# Patient Record
Sex: Male | Born: 1970 | Race: White | Hispanic: No | Marital: Married | Smoking: Former smoker
Health system: Southern US, Community
[De-identification: ages and names within clinical notes are randomized; demographics above are authoritative.]

## PROBLEM LIST (undated history)

## (undated) DIAGNOSIS — R0683 Snoring: Secondary | ICD-10-CM

## (undated) DIAGNOSIS — Z973 Presence of spectacles and contact lenses: Secondary | ICD-10-CM

## (undated) DIAGNOSIS — J329 Chronic sinusitis, unspecified: Secondary | ICD-10-CM

## (undated) DIAGNOSIS — J302 Other seasonal allergic rhinitis: Secondary | ICD-10-CM

## (undated) DIAGNOSIS — I10 Essential (primary) hypertension: Secondary | ICD-10-CM

## (undated) DIAGNOSIS — K219 Gastro-esophageal reflux disease without esophagitis: Secondary | ICD-10-CM

## (undated) HISTORY — PX: FRACTURE SURGERY: SHX138

## (undated) HISTORY — PX: WISDOM TOOTH EXTRACTION: SHX21

## (undated) HISTORY — PX: JOINT REPLACEMENT: SHX530

---
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
## 2013-04-10 ENCOUNTER — Encounter (HOSPITAL_BASED_OUTPATIENT_CLINIC_OR_DEPARTMENT_OTHER): Payer: Self-pay | Admitting: *Deleted

## 2013-04-10 NOTE — Progress Notes (Signed)
Pt probably has sleep apnea-talked to dr shoemaker about it-told py he may need to stay overnight -to bring overnight bag-does not smoke-no cardiac or other resp problems Had ekg and labs last month pcp-called for copies

## 2013-04-10 NOTE — Progress Notes (Signed)
04/10/13 1657  OBSTRUCTIVE SLEEP APNEA  Have you ever been diagnosed with sleep apnea through a sleep study? No  Do you snore loudly (loud enough to be heard through closed doors)?  1  Do you often feel tired, fatigued, or sleepy during the daytime? 0  Has anyone observed you stop breathing during your sleep? 1  Do you have, or are you being treated for high blood pressure? 1  BMI more than 35 kg/m2? 0  Age over 44 years old? 0  Neck circumference greater than 40 cm/18 inches? 0  Gender: 1  Obstructive Sleep Apnea Score 4  Score 4 or greater  Results sent to PCP

## 2013-04-14 ENCOUNTER — Ambulatory Visit (HOSPITAL_BASED_OUTPATIENT_CLINIC_OR_DEPARTMENT_OTHER): Payer: BC Managed Care – PPO | Admitting: Certified Registered Nurse Anesthetist

## 2013-04-14 ENCOUNTER — Ambulatory Visit (HOSPITAL_BASED_OUTPATIENT_CLINIC_OR_DEPARTMENT_OTHER)
Admission: RE | Admit: 2013-04-14 | Discharge: 2013-04-14 | Disposition: A | Discharge status: Home / Self Care | Payer: BC Managed Care – PPO | Source: Ambulatory Visit | Attending: Otolaryngology | Admitting: Otolaryngology

## 2013-04-14 ENCOUNTER — Encounter (HOSPITAL_BASED_OUTPATIENT_CLINIC_OR_DEPARTMENT_OTHER): Payer: Self-pay | Admitting: Certified Registered Nurse Anesthetist

## 2013-04-14 ENCOUNTER — Encounter (HOSPITAL_BASED_OUTPATIENT_CLINIC_OR_DEPARTMENT_OTHER)
Admission: RE | Disposition: A | Discharge status: Home / Self Care | Payer: Self-pay | Source: Ambulatory Visit | Attending: Otolaryngology

## 2013-04-14 ENCOUNTER — Encounter (HOSPITAL_BASED_OUTPATIENT_CLINIC_OR_DEPARTMENT_OTHER): Payer: BC Managed Care – PPO | Admitting: Certified Registered Nurse Anesthetist

## 2013-04-14 DIAGNOSIS — I10 Essential (primary) hypertension: Secondary | ICD-10-CM | POA: Insufficient documentation

## 2013-04-14 DIAGNOSIS — J342 Deviated nasal septum: Secondary | ICD-10-CM

## 2013-04-14 DIAGNOSIS — J329 Chronic sinusitis, unspecified: Secondary | ICD-10-CM | POA: Insufficient documentation

## 2013-04-14 HISTORY — DX: Snoring: R06.83

## 2013-04-14 HISTORY — DX: Other seasonal allergic rhinitis: J30.2

## 2013-04-14 HISTORY — DX: Chronic sinusitis, unspecified: J32.9

## 2013-04-14 HISTORY — DX: Presence of spectacles and contact lenses: Z97.3

## 2013-04-14 HISTORY — DX: Essential (primary) hypertension: I10

## 2013-04-14 HISTORY — PX: NASAL SEPTOPLASTY W/ TURBINOPLASTY: SHX2070

## 2013-04-14 SURGERY — SEPTOPLASTY, NOSE, WITH NASAL TURBINATE REDUCTION
Anesthesia: General | Site: Nose | Laterality: Bilateral | Wound class: Clean Contaminated

## 2013-04-14 MED ORDER — PROMETHAZINE HCL 25 MG/ML IJ SOLN
6.2500 mg | INTRAMUSCULAR | Status: DC | PRN
  Administered 2013-04-14: 12.5 mg via INTRAVENOUS

## 2013-04-14 MED ORDER — FENTANYL CITRATE 0.05 MG/ML IJ SOLN
INTRAMUSCULAR | Status: AC
  Filled 2013-04-14: qty 6

## 2013-04-14 MED ORDER — FENTANYL CITRATE 0.05 MG/ML IJ SOLN
INTRAMUSCULAR | Status: DC | PRN
  Administered 2013-04-14: 100 ug via INTRAVENOUS

## 2013-04-14 MED ORDER — HYDROMORPHONE HCL PF 1 MG/ML IJ SOLN
INTRAMUSCULAR | Status: AC
  Filled 2013-04-14: qty 1

## 2013-04-14 MED ORDER — LIDOCAINE-EPINEPHRINE 1 %-1:100000 IJ SOLN
INTRAMUSCULAR | Status: DC | PRN
  Administered 2013-04-14: 8 mL

## 2013-04-14 MED ORDER — OXYMETAZOLINE HCL 0.05 % NA SOLN
NASAL | Status: AC
  Filled 2013-04-14: qty 15

## 2013-04-14 MED ORDER — MUPIROCIN 2 % EX OINT
TOPICAL_OINTMENT | CUTANEOUS | Status: DC | PRN
  Administered 2013-04-14: 1 via NASAL

## 2013-04-14 MED ORDER — HYDROMORPHONE HCL PF 1 MG/ML IJ SOLN
0.2500 mg | INTRAMUSCULAR | Status: DC | PRN
  Administered 2013-04-14: 0.5 mg via INTRAVENOUS

## 2013-04-14 MED ORDER — ONDANSETRON HCL 4 MG/2ML IJ SOLN
INTRAMUSCULAR | Status: DC | PRN
  Administered 2013-04-14: 4 mg via INTRAVENOUS

## 2013-04-14 MED ORDER — LIDOCAINE HCL (CARDIAC) 20 MG/ML IV SOLN
INTRAVENOUS | Status: DC | PRN
  Administered 2013-04-14: 80 mg via INTRAVENOUS

## 2013-04-14 MED ORDER — PROMETHAZINE HCL 25 MG/ML IJ SOLN
INTRAMUSCULAR | Status: AC
  Filled 2013-04-14: qty 1

## 2013-04-14 MED ORDER — PROPOFOL 10 MG/ML IV BOLUS
INTRAVENOUS | Status: DC | PRN
  Administered 2013-04-14: 300 mg via INTRAVENOUS

## 2013-04-14 MED ORDER — MIDAZOLAM HCL 5 MG/5ML IJ SOLN
INTRAMUSCULAR | Status: DC | PRN
  Administered 2013-04-14: 2 mg via INTRAVENOUS

## 2013-04-14 MED ORDER — HYDROCODONE-ACETAMINOPHEN 5-325 MG PO TABS: 1.0000 | ORAL_TABLET | Freq: Four times a day (QID) | ORAL | Status: DC | PRN

## 2013-04-14 MED ORDER — OXYCODONE HCL 5 MG PO TABS: 5.0000 mg | ORAL_TABLET | Freq: Once | ORAL | Status: DC | PRN

## 2013-04-14 MED ORDER — MUPIROCIN 2 % EX OINT
TOPICAL_OINTMENT | CUTANEOUS | Status: AC
  Filled 2013-04-14: qty 22

## 2013-04-14 MED ORDER — MIDAZOLAM HCL 2 MG/2ML IJ SOLN
INTRAMUSCULAR | Status: AC
  Filled 2013-04-14: qty 2

## 2013-04-14 MED ORDER — OXYMETAZOLINE HCL 0.05 % NA SOLN
NASAL | Status: DC | PRN
  Administered 2013-04-14: 1 via NASAL

## 2013-04-14 MED ORDER — PROPOFOL 10 MG/ML IV EMUL
INTRAVENOUS | Status: AC
  Filled 2013-04-14: qty 50

## 2013-04-14 MED ORDER — BACITRACIN ZINC 500 UNIT/GM EX OINT
TOPICAL_OINTMENT | CUTANEOUS | Status: AC
  Filled 2013-04-14: qty 28.35

## 2013-04-14 MED ORDER — LIDOCAINE-EPINEPHRINE 1 %-1:100000 IJ SOLN
INTRAMUSCULAR | Status: AC
  Filled 2013-04-14: qty 1

## 2013-04-14 MED ORDER — DEXAMETHASONE SODIUM PHOSPHATE 4 MG/ML IJ SOLN
INTRAMUSCULAR | Status: DC | PRN
  Administered 2013-04-14: 10 mg via INTRAVENOUS

## 2013-04-14 MED ORDER — AMOXICILLIN-POT CLAVULANATE 500-125 MG PO TABS: 1.0000 | ORAL_TABLET | Freq: Two times a day (BID) | ORAL | Status: DC

## 2013-04-14 MED ORDER — CEFAZOLIN SODIUM-DEXTROSE 2-3 GM-% IV SOLR
2.0000 g | Freq: Once | INTRAVENOUS | Status: AC
  Administered 2013-04-14: 2 g via INTRAVENOUS

## 2013-04-14 MED ORDER — SUCCINYLCHOLINE CHLORIDE 20 MG/ML IJ SOLN
INTRAMUSCULAR | Status: DC | PRN
  Administered 2013-04-14: 100 mg via INTRAVENOUS

## 2013-04-14 MED ORDER — OXYCODONE HCL 5 MG/5ML PO SOLN: 5.0000 mg | Freq: Once | ORAL | Status: DC | PRN

## 2013-04-14 MED ORDER — CEFAZOLIN SODIUM 1-5 GM-% IV SOLN
INTRAVENOUS | Status: AC
  Filled 2013-04-14: qty 100

## 2013-04-14 MED ORDER — LACTATED RINGERS IV SOLN
INTRAVENOUS | Status: DC
  Administered 2013-04-14 (×3): via INTRAVENOUS

## 2013-04-14 SURGICAL SUPPLY — 31 items
ATTRACTOMAT 16X20 MAGNETIC DRP (DRAPES) IMPLANT
BLADE SURG 15 STRL LF DISP TIS (BLADE) ×1 IMPLANT
BLADE SURG 15 STRL SS (BLADE) ×1
CANISTER SUCT 1200ML W/VALVE (MISCELLANEOUS) ×2 IMPLANT
COAGULATOR SUCT 8FR VV (MISCELLANEOUS) IMPLANT
DECANTER SPIKE VIAL GLASS SM (MISCELLANEOUS) IMPLANT
DRSG NASOPORE 8CM (GAUZE/BANDAGES/DRESSINGS) IMPLANT
DRSG TELFA 3X8 NADH (GAUZE/BANDAGES/DRESSINGS) ×2 IMPLANT
ELECT REM PT RETURN 9FT ADLT (ELECTROSURGICAL) ×2
ELECTRODE REM PT RTRN 9FT ADLT (ELECTROSURGICAL) ×1 IMPLANT
GLOVE BIO SURGEON STRL SZ 6.5 (GLOVE) ×2 IMPLANT
GLOVE BIOGEL M 7.0 STRL (GLOVE) ×4 IMPLANT
GLOVE BIOGEL PI IND STRL 7.0 (GLOVE) ×1 IMPLANT
GLOVE BIOGEL PI INDICATOR 7.0 (GLOVE) ×1
GOWN PREVENTION PLUS XLARGE (GOWN DISPOSABLE) ×4 IMPLANT
NEEDLE 27GAX1X1/2 (NEEDLE) ×2 IMPLANT
NS IRRIG 1000ML POUR BTL (IV SOLUTION) IMPLANT
PACK BASIN DAY SURGERY FS (CUSTOM PROCEDURE TRAY) ×2 IMPLANT
PACK ENT DAY SURGERY (CUSTOM PROCEDURE TRAY) ×2 IMPLANT
SET EXT MALE ROTATING LL 32IN (MISCELLANEOUS) ×2 IMPLANT
SLEEVE SCD COMPRESS KNEE MED (MISCELLANEOUS) ×2 IMPLANT
SPLINT NASAL DOYLE BI-VL (GAUZE/BANDAGES/DRESSINGS) ×2 IMPLANT
SPONGE GAUZE 2X2 8PLY STRL LF (GAUZE/BANDAGES/DRESSINGS) ×2 IMPLANT
SPONGE NEURO XRAY DETECT 1X3 (DISPOSABLE) ×2 IMPLANT
SPONGE SURGIFOAM ABS GEL 12-7 (HEMOSTASIS) IMPLANT
SUT ETHILON 3 0 PS 1 (SUTURE) ×2 IMPLANT
SUT PLAIN 4 0 ~~LOC~~ 1 (SUTURE) ×2 IMPLANT
TOWEL OR 17X24 6PK STRL BLUE (TOWEL DISPOSABLE) ×2 IMPLANT
TUBE SALEM SUMP 12R W/ARV (TUBING) IMPLANT
TUBE SALEM SUMP 16 FR W/ARV (TUBING) ×2 IMPLANT
YANKAUER SUCT BULB TIP NO VENT (SUCTIONS) ×2 IMPLANT

## 2013-04-14 NOTE — Anesthesia Preprocedure Evaluation (Addendum)
Anesthesia Evaluation  Patient identified by MRN, date of birth, ID band Patient awake    Reviewed: Allergy & Precautions, NPO status   Airway Mallampati: I  Neck ROM: Full    Dental  (+) Teeth Intact   Pulmonary  breath sounds clear to auscultation        Cardiovascular hypertension, Pt. on medications Rhythm:Regular Rate:Normal     Neuro/Psych    GI/Hepatic   Endo/Other    Renal/GU      Musculoskeletal   Abdominal   Peds  Hematology   Anesthesia Other Findings   Reproductive/Obstetrics                         Anesthesia Physical Anesthesia Plan  ASA: II  Anesthesia Plan: General   Post-op Pain Management:    Induction: Intravenous  Airway Management Planned: Oral ETT  Additional Equipment:   Intra-op Plan:   Post-operative Plan: Extubation in OR  Informed Consent: I have reviewed the patients History and Physical, chart, labs and discussed the procedure including the risks, benefits and alternatives for the proposed anesthesia with the patient or authorized representative who has indicated his/her understanding and acceptance.   Dental advisory given  Plan Discussed with:   Anesthesia Plan Comments:         Anesthesia Quick Evaluation

## 2013-04-14 NOTE — H&P (Signed)
Douglas Whitney is an 42 y.o. male.   Chief Complaint: Nasal Obstruction HPI: Prog hx of nasal obstruction and snoring  Past Medical History  Diagnosis Date  . Hypertension   . Sinusitis   . Seasonal allergies   . Snores   . Wears glasses     Past Surgical History  Procedure Laterality Date  . Wisdom tooth extraction    . Finger amputation  1983    cut off tip lt index finger  . Orif finger fracture  1984    lt middle    History reviewed. No pertinent family history. Social History:  reports that he quit smoking about 20 years ago. He does not have any smokeless tobacco history on file. He reports that he drinks alcohol. He reports that he does not use illicit drugs.  Allergies: No Known Allergies  Medications Prior to Admission  Medication Sig Dispense Refill  . fish oil-omega-3 fatty acids 1000 MG capsule Take 2 g by mouth daily.      . hydrochlorothiazide (HYDRODIURIL) 25 MG tablet Take 25 mg by mouth daily.      . mometasone (NASONEX) 50 MCG/ACT nasal spray Place 2 sprays into the nose daily.        No results found for this or any previous visit (from the past 48 hour(s)). No results found.  Review of Systems  Constitutional: Negative.   Respiratory: Negative.   Cardiovascular: Negative.   Gastrointestinal: Negative.   Neurological: Negative.     Blood pressure 143/94, pulse 66, temperature 98.6 F (37 C), temperature source Oral, resp. rate 20, height 6\' 4"  (1.93 m), weight 130.182 kg (287 lb), SpO2 97.00%. Physical Exam  Constitutional: He is oriented to person, place, and time. He appears well-developed and well-nourished.  HENT:  Nose: Septal deviation present.  Neck: Normal range of motion. Neck supple.  Cardiovascular: Normal rate.   Respiratory: Effort normal.  GI: Soft.  Musculoskeletal: Normal range of motion.  Neurological: He is alert and oriented to person, place, and time.     Assessment/Plan Adm for OP septo and IT  reduction.  Douglas Whitney 04/14/2013, 9:07 AM

## 2013-04-14 NOTE — Anesthesia Postprocedure Evaluation (Signed)
  Anesthesia Post-op Note  Patient: Douglas Whitney  Procedure(s) Performed: Procedure(s): NASAL SEPTOPLASTY BILATERAL INFERIOR TURBINATE REDUCTION  (Bilateral)  Patient Location: PACU  Anesthesia Type:General  Level of Consciousness: awake  Airway and Oxygen Therapy: Patient Spontanous Breathing  Post-op Pain: mild  Post-op Assessment: Post-op Vital signs reviewed  Post-op Vital Signs: stable  Complications: No apparent anesthesia complications

## 2013-04-14 NOTE — Brief Op Note (Signed)
04/14/2013  10:15 AM  PATIENT:  Douglas Whitney  42 y.o. male  PRE-OPERATIVE DIAGNOSIS:  DEVIATED SEPTUM   POST-OPERATIVE DIAGNOSIS:  DEVIATED SEPTUM   PROCEDURE:  Procedure(s): NASAL SEPTOPLASTY BILATERAL INFERIOR TURBINATE REDUCTION  (Bilateral)  SURGEON:  Surgeon(s) and Role:    * Osborn Coho, MD - Primary  PHYSICIAN ASSISTANT:   ASSISTANTS: none   ANESTHESIA:   general  EBL:  Total I/O In: 1500 [I.V.:1500] Out: - < 50 cc  BLOOD ADMINISTERED:none  DRAINS: none   LOCAL MEDICATIONS USED:  LIDOCAINE  and Amount: 8 ml  SPECIMEN:  No Specimen  DISPOSITION OF SPECIMEN:  N/A  COUNTS:  YES  TOURNIQUET:  * No tourniquets in log *  DICTATION: .Other Dictation: Dictation Number [id number]  PLAN OF CARE: Discharge to home after PACU  PATIENT DISPOSITION:  PACU - hemodynamically stable.   Delay start of Pharmacological VTE agent (>24hrs) due to surgical blood loss or risk of bleeding: not applicable

## 2013-04-14 NOTE — Transfer of Care (Signed)
Immediate Anesthesia Transfer of Care Note  Patient: Douglas Whitney  Procedure(s) Performed: Procedure(s): NASAL SEPTOPLASTY BILATERAL INFERIOR TURBINATE REDUCTION  (Bilateral)  Patient Location: PACU  Anesthesia Type:General  Level of Consciousness: awake, alert , oriented and patient cooperative  Airway & Oxygen Therapy: Patient Spontanous Breathing and Patient connected to face mask oxygen  Post-op Assessment: Report given to PACU RN and Post -op Vital signs reviewed and stable  Post vital signs: Reviewed and stable  Complications: No apparent anesthesia complications

## 2013-04-14 NOTE — Anesthesia Procedure Notes (Signed)
Procedure Name: Intubation Date/Time: 04/14/2013 9:13 AM Performed by: Annalee Genta, DAVID Pre-anesthesia Checklist: Patient identified, Emergency Drugs available, Suction available and Patient being monitored Patient Re-evaluated:Patient Re-evaluated prior to inductionOxygen Delivery Method: Circle System Utilized Preoxygenation: Pre-oxygenation with 100% oxygen Intubation Type: IV induction Ventilation: Mask ventilation without difficulty Laryngoscope Size: Mac and 3 Grade View: Grade II Tube type: Oral Tube size: 7.0 mm Number of attempts: 1 Airway Equipment and Method: stylet and oral airway Placement Confirmation: ETT inserted through vocal cords under direct vision,  positive ETCO2 and breath sounds checked- equal and bilateral Secured at: 22 cm Tube secured with: Tape Dental Injury: Teeth and Oropharynx as per pre-operative assessment

## 2013-04-17 ENCOUNTER — Encounter (HOSPITAL_BASED_OUTPATIENT_CLINIC_OR_DEPARTMENT_OTHER): Payer: Self-pay | Admitting: Otolaryngology

## 2013-04-17 LAB — POCT I-STAT, CHEM 8
BUN: 18 mg/dL (ref 6–23)
Calcium, Ion: 1.2 mmol/L (ref 1.12–1.23)
Chloride: 105 mEq/L (ref 96–112)
Creatinine, Ser: 1 mg/dL (ref 0.50–1.35)
Glucose, Bld: 103 mg/dL — ABNORMAL HIGH (ref 70–99)
Sodium: 142 mEq/L (ref 135–145)

## 2013-04-17 NOTE — Op Note (Signed)
NAME:  Douglas Whitney, REPPOND NO.:  [id number]  MEDICAL RECORD NO.:  [id number]  LOCATION:                               FACILITY:  [facility]  PHYSICIAN:  Kinnie Scales. Annalee Genta, M.D.DATE OF BIRTH:  1971/05/03  DATE OF PROCEDURE:  04/14/2013 DATE OF DISCHARGE:  04/14/2013                              OPERATIVE REPORT   PREOPERATIVE DIAGNOSES: 1. Progressive nasal airway obstruction. 2. Deviated nasal septum. 3. Inferior turbinate hypertrophy.  POSTOPERATIVE DIAGNOSES: 1. Progressive nasal airway obstruction. 2. Deviated nasal septum. 3. Inferior turbinate hypertrophy.  INDICATION FOR SURGERY: 1. Progressive nasal airway obstruction. 2. Deviated nasal septum. 3. Inferior turbinate hypertrophy.  SURGICAL PROCEDURE: 1. Nasal septoplasty. 2. Bilateral inferior turbinate reduction.  ANESTHESIA:  General endotracheal.  COMPLICATIONS:  No complications.  ESTIMATED BLOOD LOSS:  Less than 50 mL.  The patient transferred from the operating room to the recovery room in stable condition.  BRIEF HISTORY:  The patient is a 42 year old white male, referred to our office for evaluation of progressive symptoms of nasal airway obstruction, heavy nighttime congestion, and nighttime snoring. Examination showed a severely deviated nasal septum with nasal airway obstruction and bilateral inferior turbinate hypertrophy.  The patient was treated with topical nasal steroid, saline spray, and antihistamines and failed to have an adequate response with continued symptoms of nasal airway obstruction and congestion.  Given his history and physical examination, I recommended nasal septoplasty and bilateral inferior turbinate reduction.  The risks and benefits of the procedure were discussed in detail with the patient and his wife and they understood and concurred with our plan for surgery which is scheduled as an outpatient under general anesthesia on April 14, 2013.  DESCRIPTION OF  PROCEDURE:  The patient was brought to the operating room and placed in supine position on the operating table.  General endotracheal anesthesia was established without difficulty.  When the patient was adequately anesthetized, he was positioned on the operating table and prepped and draped in a sterile fashion.  His nose was injected with a total of 8 mL of 1% lidocaine with 1:100,000 solution epinephrine injected in submucosal fashion along the nasal septum and inferior turbinates bilaterally.  The patient's nose was then packed with Afrin-soaked cottonoid pledgets and left in place for approximately 10 minutes to allow for vasoconstriction and hemostasis.  When the patient was prepped and draped for surgery, a left anterior hemitransfixion incision was created and a mucoperichondrial flap was elevated from anterior to posterior along the left-hand side.  The anterior cartilaginous septum was across the midline and mucoperichondrial flap elevated on the right.  Deviated bone and cartilage in the anterior and mid aspects of the septum were removed. This tissue was later morselized and returned to the mucoperichondrial pocket.  Dissection was then carried out from anterior to posterior resecting deviated bone and cartilage, preserving the overlying mucosa and bringing the septum to a good midline position.  The cartilage was morselized and returned to the mucoperichondrial pocket.  The flaps were reapproximated with a 4-0 gut suture on a Keith needle in a horizontal mattress fashion.  The anterior hemitransfixion incision was closed with the same stitch.  At the conclusion of procedure, bilateral  Doyle nasal septal splints were placed after the application of Bactroban ointment and sutured in position with a 3-0 Ethilon suture.  Inferior turbinate reduction was then performed with cautery set at 12 watts, 2 submucosal passes were made in each inferior turbinate.  When the turbinates had  been adequately cauterized, anterior incisions were created bilaterally, overlying soft tissue was elevated and small amount of turbinate bone was resected.  Turbinates were then outfractured to create a more patent nasal cavity.  The patient's nasal cavity was irrigated and suctioned and orogastric tube was passed.  Stomach contents were aspirated.  The patient was then awakened from his anesthetic, he was extubated and transferred from the operating room to the recovery room in stable condition.  There were no complications.  Estimated blood loss was less than 50 mL.          ______________________________ Kinnie Scales. Annalee Genta, M.D.     DLS/MEDQ  D:  40/98/1191  T:  04/15/2013  Job:  [id number]

## 2013-05-14 ENCOUNTER — Encounter (HOSPITAL_COMMUNITY): Payer: Self-pay | Admitting: Emergency Medicine

## 2013-05-14 ENCOUNTER — Emergency Department (HOSPITAL_COMMUNITY): Payer: BC Managed Care – PPO

## 2013-05-14 ENCOUNTER — Emergency Department (HOSPITAL_COMMUNITY)
Admission: EM | Admit: 2013-05-14 | Discharge: 2013-05-14 | Disposition: A | Discharge status: Home / Self Care | Payer: BC Managed Care – PPO | Attending: Emergency Medicine | Admitting: Emergency Medicine

## 2013-05-14 DIAGNOSIS — Z87891 Personal history of nicotine dependence: Secondary | ICD-10-CM | POA: Insufficient documentation

## 2013-05-14 DIAGNOSIS — I10 Essential (primary) hypertension: Secondary | ICD-10-CM | POA: Insufficient documentation

## 2013-05-14 DIAGNOSIS — R079 Chest pain, unspecified: Secondary | ICD-10-CM

## 2013-05-14 DIAGNOSIS — K219 Gastro-esophageal reflux disease without esophagitis: Secondary | ICD-10-CM

## 2013-05-14 DIAGNOSIS — R109 Unspecified abdominal pain: Secondary | ICD-10-CM

## 2013-05-14 DIAGNOSIS — R110 Nausea: Secondary | ICD-10-CM | POA: Insufficient documentation

## 2013-05-14 DIAGNOSIS — R1011 Right upper quadrant pain: Secondary | ICD-10-CM | POA: Insufficient documentation

## 2013-05-14 DIAGNOSIS — Z8709 Personal history of other diseases of the respiratory system: Secondary | ICD-10-CM | POA: Insufficient documentation

## 2013-05-14 DIAGNOSIS — Z79899 Other long term (current) drug therapy: Secondary | ICD-10-CM | POA: Insufficient documentation

## 2013-05-14 LAB — CBC
HCT: 40 % (ref 39.0–52.0)
Hemoglobin: 14.7 g/dL (ref 13.0–17.0)
MCH: 32 pg (ref 26.0–34.0)
MCHC: 36.8 g/dL — ABNORMAL HIGH (ref 30.0–36.0)
MCV: 87 fL (ref 78.0–100.0)
RBC: 4.6 MIL/uL (ref 4.22–5.81)
WBC: 8.1 10*3/uL (ref 4.0–10.5)

## 2013-05-14 LAB — POCT I-STAT, CHEM 8
BUN: 15 mg/dL (ref 6–23)
Calcium, Ion: 1.19 mmol/L (ref 1.12–1.23)
Chloride: 101 mEq/L (ref 96–112)
Creatinine, Ser: 1 mg/dL (ref 0.50–1.35)
Glucose, Bld: 129 mg/dL — ABNORMAL HIGH (ref 70–99)
Hemoglobin: 14.3 g/dL (ref 13.0–17.0)
Sodium: 141 mEq/L (ref 135–145)

## 2013-05-14 LAB — POCT I-STAT TROPONIN I
Troponin i, poc: 0 ng/mL (ref 0.00–0.08)
Troponin i, poc: 0.01 ng/mL (ref 0.00–0.08)

## 2013-05-14 LAB — COMPREHENSIVE METABOLIC PANEL
Albumin: 3.8 g/dL (ref 3.5–5.2)
Alkaline Phosphatase: 65 U/L (ref 39–117)
BUN: 13 mg/dL (ref 6–23)
CO2: 26 mEq/L (ref 19–32)
Calcium: 9.2 mg/dL (ref 8.4–10.5)
Chloride: 99 mEq/L (ref 96–112)
Creatinine, Ser: 0.8 mg/dL (ref 0.50–1.35)
GFR calc non Af Amer: >90 mL/min (ref >90)
Glucose, Bld: 123 mg/dL — ABNORMAL HIGH (ref 70–99)
Potassium: 3.3 mEq/L — ABNORMAL LOW (ref 3.5–5.1)
Total Bilirubin: 0.4 mg/dL (ref 0.3–1.2)

## 2013-05-14 LAB — LIPASE, BLOOD: Lipase: 43 U/L (ref 11–59)

## 2013-05-14 MED ORDER — NITROGLYCERIN 0.4 MG SL SUBL
0.4000 mg | SUBLINGUAL_TABLET | SUBLINGUAL | Status: DC | PRN
  Administered 2013-05-14: 0.4 mg via SUBLINGUAL

## 2013-05-14 MED ORDER — ONDANSETRON HCL 4 MG/2ML IJ SOLN
4.0000 mg | Freq: Once | INTRAMUSCULAR | Status: AC
  Administered 2013-05-14: 4 mg via INTRAVENOUS
  Filled 2013-05-14: qty 2

## 2013-05-14 MED ORDER — ASPIRIN 81 MG PO CHEW
324.0000 mg | CHEWABLE_TABLET | Freq: Once | ORAL | Status: AC
Start: 2013-05-14 — End: 2013-05-14
  Administered 2013-05-14: 324 mg via ORAL
  Filled 2013-05-14: qty 4

## 2013-05-14 MED ORDER — FENTANYL CITRATE 0.05 MG/ML IJ SOLN: 50.0000 ug | INTRAMUSCULAR | Status: DC | PRN

## 2013-05-14 MED ORDER — POTASSIUM CHLORIDE CRYS ER 20 MEQ PO TBCR
40.0000 meq | EXTENDED_RELEASE_TABLET | Freq: Once | ORAL | Status: AC
  Administered 2013-05-14: 40 meq via ORAL
  Filled 2013-05-14: qty 2

## 2013-05-14 MED ORDER — RANITIDINE HCL 150 MG PO TABS: 150.0000 mg | ORAL_TABLET | Freq: Two times a day (BID) | ORAL | Status: DC

## 2013-05-14 MED ORDER — PANTOPRAZOLE SODIUM 40 MG IV SOLR
40.0000 mg | Freq: Once | INTRAVENOUS | Status: AC
  Administered 2013-05-14: 40 mg via INTRAVENOUS
  Filled 2013-05-14: qty 40

## 2013-05-14 NOTE — ED Provider Notes (Signed)
CSN: [id number]     Arrival date & time 05/14/13  0320 History   First MD Initiated Contact with Patient 05/14/13 [phone]     Chief Complaint  Patient presents with  . Chest Pain   (Consider location/radiation/quality/duration/timing/severity/associated sxs/prior Treatment) HPI History provided by patient. Woke up this morning with right upper quadrant abdominal pain. Sharp in quality and moderate severity. No radiation of pain. It has been persistent without known alleviating factors. No difficulty breathing. It does hurt to take a deep breath. Has had some belching with a history of GERD but denies any burning pain or heartburn otherwise. No chest pain. Some nausea but no vomiting. Ate Seafood at a dinner party tonight, no history of gallbladder problems. No known history of heart disease. Past Medical History  Diagnosis Date  . Hypertension   . Sinusitis   . Seasonal allergies   . Snores   . Wears glasses    Past Surgical History  Procedure Laterality Date  . Wisdom tooth extraction    . Finger amputation  1983    cut off tip lt index finger  . Orif finger fracture  1984    lt middle  . Nasal septoplasty w/ turbinoplasty Bilateral 04/14/2013    Procedure: NASAL SEPTOPLASTY BILATERAL INFERIOR TURBINATE REDUCTION ;  Surgeon: Osborn Coho, MD;  Location: [facility];  Service: ENT;  Laterality: Bilateral;   No family history on file. History  Substance Use Topics  . Smoking status: Former Smoker    Quit date: 04/10/1993  . Smokeless tobacco: Not on file  . Alcohol Use: Yes     Comment: rare    Review of Systems  Constitutional: Negative for fever and chills.  Respiratory: Negative for shortness of breath.   Cardiovascular: Negative for palpitations.  Gastrointestinal: Positive for nausea and abdominal pain.  Genitourinary: Negative for dysuria.  Musculoskeletal: Negative for back pain, neck pain and neck stiffness.  Skin: Negative for rash.  Neurological:  Negative for headaches.  All other systems reviewed and are negative.    Allergies  Review of patient's allergies indicates no known allergies.  Home Medications   Current Outpatient Rx  Name  Route  Sig  Dispense  Refill  . hydrochlorothiazide (HYDRODIURIL) 25 MG tablet   Oral   Take 25 mg by mouth daily.         Marland Kitchen omega-3 acid ethyl esters (LOVAZA) 1 G capsule   Oral   Take 2 g by mouth daily.         Marland Kitchen OVER THE COUNTER MEDICATION   Oral   Take 1 tablet by mouth daily. maca  root          BP 137/81  Pulse 72  Temp(Src) 98.1 F (36.7 C) (Oral)  Resp 20  SpO2 97% Physical Exam  Constitutional: He is oriented to person, place, and time. He appears well-developed and well-nourished.  HENT:  Head: Normocephalic and atraumatic.  Eyes: EOM are normal. Pupils are equal, round, and reactive to light.  Neck: Neck supple.  Cardiovascular: Normal rate, regular rhythm and intact distal pulses.   Pulmonary/Chest: Effort normal and breath sounds normal. No respiratory distress. He exhibits no tenderness.  Abdominal: Soft. Bowel sounds are normal. There is no tenderness. There is no rebound and no guarding.  Tender somewhat epigastric but more so right upper quadrant  Musculoskeletal: Normal range of motion. He exhibits no edema and no tenderness.  Neurological: He is alert and oriented to person, place, and time.  Skin: Skin is warm and dry.    ED Course  Procedures (including critical care time) Labs Review Labs Reviewed  CBC - Abnormal; Notable for the following:    MCHC 36.8 (*)    All other components within normal limits  COMPREHENSIVE METABOLIC PANEL - Abnormal; Notable for the following:    Potassium 3.3 (*)    Glucose, Bld 123 (*)    All other components within normal limits  POCT I-STAT, CHEM 8 - Abnormal; Notable for the following:    Potassium 3.2 (*)    Glucose, Bld 129 (*)    All other components within normal limits  LIPASE, BLOOD  POCT I-STAT  TROPONIN I   Imaging Review Dg Chest 2 View  05/14/2013   CLINICAL DATA:  Chest pain.  EXAM: CHEST  2 VIEW  COMPARISON:  None available for comparison at time of study interpretation.  FINDINGS: Cardiomediastinal silhouette is unremarkable. The lungs are clear without pleural effusions or focal consolidations. Pulmonary vasculature is unremarkable. Trachea projects midline and there is no pneumothorax. Soft tissue planes and included osseous structures are nonsuspicious.  IMPRESSION: No active cardiopulmonary disease.   Electronically Signed   By: Awilda Metro   On: 05/14/2013 04:33    EKG Interpretation    Date/Time:  Sunday May 14 2013 03:23:44 EST Ventricular Rate:  79 PR Interval:  198 QRS Duration: 100 QT Interval:  384 QTC Calculation: 440 R Axis:   36 Text Interpretation:  Normal sinus rhythm Nonspecific ST abnormality Abnormal ECG Confirmed by Kjell Brannen  MD, Stephanieann Popescu ([id number]) on 05/14/2013 3:35:28 AM           IV fentanyl. IV Zofran. IV protonix.  Repeat exam much improved - no guarding and only min tenderness RUQ/ epigastric  7:09 AM symptoms improved resting comfortably. Plan repeat troponin with ultrasound pending.   Dr Karma Ganja to follow those results.  MDM  Dx: ABD pain  Symptoms likely gastritis possibly related to GB, doubt ACS Evaluated with ECG, serial troponins, labs and [place] Improved with IV narctoics  Sunnie Nielsen, MD 05/14/13 2353

## 2013-05-14 NOTE — ED Notes (Signed)
The pt is c/o mid chest pain or epigastric pain just pta.  He woke up with this pain.  Sob whenever he breaths

## 2013-05-14 NOTE — ED Notes (Signed)
Ultrasound personnel called to inquire about wait time. [place] indicated that patient is 3rd in line currently. Expected wait time is >1 hr. Patient informed and agreeable. Apologies made for wait times.

## 2013-05-14 NOTE — ED Notes (Signed)
Pt currently in US.

## 2014-07-02 ENCOUNTER — Other Ambulatory Visit: Payer: Self-pay | Admitting: Family Medicine

## 2014-07-02 DIAGNOSIS — N6459 Other signs and symptoms in breast: Secondary | ICD-10-CM

## 2014-07-02 DIAGNOSIS — N6489 Other specified disorders of breast: Secondary | ICD-10-CM

## 2014-07-03 ENCOUNTER — Ambulatory Visit
Admission: RE | Admit: 2014-07-03 | Discharge: 2014-07-03 | Disposition: A | Discharge status: Home / Self Care | Payer: BC Managed Care – PPO | Source: Ambulatory Visit | Attending: Family Medicine | Admitting: Family Medicine

## 2014-07-03 DIAGNOSIS — N6459 Other signs and symptoms in breast: Secondary | ICD-10-CM

## 2014-07-03 DIAGNOSIS — N6489 Other specified disorders of breast: Secondary | ICD-10-CM

## 2014-09-06 ENCOUNTER — Other Ambulatory Visit (HOSPITAL_COMMUNITY): Payer: Self-pay | Admitting: Family Medicine

## 2014-09-06 DIAGNOSIS — R1011 Right upper quadrant pain: Secondary | ICD-10-CM

## 2014-09-21 ENCOUNTER — Ambulatory Visit (HOSPITAL_COMMUNITY)
Admission: RE | Admit: 2014-09-21 | Discharge: 2014-09-21 | Disposition: A | Discharge status: Home / Self Care | Payer: BC Managed Care – PPO | Source: Ambulatory Visit | Attending: Family Medicine | Admitting: Family Medicine

## 2014-09-21 DIAGNOSIS — R1011 Right upper quadrant pain: Secondary | ICD-10-CM | POA: Insufficient documentation

## 2014-09-21 MED ORDER — SINCALIDE 5 MCG IJ SOLR
INTRAMUSCULAR | Status: AC
  Filled 2014-09-21: qty 10

## 2014-09-21 MED ORDER — TECHNETIUM TC 99M MEBROFENIN IV KIT
5.0000 | PACK | Freq: Once | INTRAVENOUS | Status: AC | PRN
Start: 2014-09-21 — End: 2014-09-21
  Administered 2014-09-21: 5 via INTRAVENOUS

## 2014-09-21 MED ORDER — SINCALIDE 5 MCG IJ SOLR
0.0200 ug/kg | Freq: Once | INTRAMUSCULAR | Status: AC
  Administered 2014-09-21: 2.6 ug via INTRAVENOUS

## 2014-09-21 MED ORDER — STERILE WATER FOR INJECTION IJ SOLN
INTRAMUSCULAR | Status: AC
  Filled 2014-09-21: qty 10

## 2014-10-17 ENCOUNTER — Other Ambulatory Visit: Payer: Self-pay | Admitting: Gastroenterology

## 2014-10-17 DIAGNOSIS — R1013 Epigastric pain: Secondary | ICD-10-CM

## 2014-10-19 ENCOUNTER — Other Ambulatory Visit: Payer: BC Managed Care – PPO

## 2014-10-23 ENCOUNTER — Other Ambulatory Visit: Payer: Self-pay | Admitting: Gastroenterology

## 2014-10-23 DIAGNOSIS — R1013 Epigastric pain: Secondary | ICD-10-CM

## 2014-10-30 ENCOUNTER — Ambulatory Visit
Admission: RE | Admit: 2014-10-30 | Discharge: 2014-10-30 | Disposition: A | Discharge status: Home / Self Care | Payer: BC Managed Care – PPO | Source: Ambulatory Visit | Attending: Gastroenterology | Admitting: Gastroenterology

## 2014-10-30 DIAGNOSIS — R1013 Epigastric pain: Secondary | ICD-10-CM

## 2014-10-30 MED ORDER — IOHEXOL 300 MG/ML  SOLN
125.0000 mL | Freq: Once | INTRAMUSCULAR | Status: AC | PRN
  Administered 2014-10-30: 125 mL via INTRAVENOUS

## 2015-06-09 HISTORY — PX: ESOPHAGOGASTRODUODENOSCOPY: SHX1529

## 2015-07-11 ENCOUNTER — Other Ambulatory Visit: Payer: Self-pay | Admitting: Family Medicine

## 2015-07-11 DIAGNOSIS — K8050 Calculus of bile duct without cholangitis or cholecystitis without obstruction: Secondary | ICD-10-CM

## 2015-07-15 ENCOUNTER — Emergency Department (HOSPITAL_COMMUNITY): Payer: BC Managed Care – PPO

## 2015-07-15 ENCOUNTER — Ambulatory Visit: Payer: Self-pay | Admitting: Surgery

## 2015-07-15 ENCOUNTER — Emergency Department (HOSPITAL_COMMUNITY)
Admission: EM | Admit: 2015-07-15 | Discharge: 2015-07-15 | Disposition: A | Discharge status: Home / Self Care | Payer: BC Managed Care – PPO | Attending: Emergency Medicine | Admitting: Emergency Medicine

## 2015-07-15 ENCOUNTER — Encounter (HOSPITAL_COMMUNITY): Payer: Self-pay | Admitting: *Deleted

## 2015-07-15 DIAGNOSIS — R109 Unspecified abdominal pain: Secondary | ICD-10-CM | POA: Diagnosis present

## 2015-07-15 DIAGNOSIS — K8050 Calculus of bile duct without cholangitis or cholecystitis without obstruction: Secondary | ICD-10-CM

## 2015-07-15 DIAGNOSIS — K8020 Calculus of gallbladder without cholecystitis without obstruction: Secondary | ICD-10-CM | POA: Insufficient documentation

## 2015-07-15 DIAGNOSIS — Z79899 Other long term (current) drug therapy: Secondary | ICD-10-CM | POA: Insufficient documentation

## 2015-07-15 DIAGNOSIS — I10 Essential (primary) hypertension: Secondary | ICD-10-CM | POA: Diagnosis not present

## 2015-07-15 DIAGNOSIS — Z87891 Personal history of nicotine dependence: Secondary | ICD-10-CM | POA: Insufficient documentation

## 2015-07-15 LAB — COMPREHENSIVE METABOLIC PANEL
ALBUMIN: 4.4 g/dL (ref 3.5–5.0)
ALK PHOS: 52 U/L (ref 38–126)
ALT: 24 U/L (ref 17–63)
AST: 23 U/L (ref 15–41)
Anion gap: 14 (ref 5–15)
BUN: 13 mg/dL (ref 6–20)
CALCIUM: 9.9 mg/dL (ref 8.9–10.3)
CO2: 26 mmol/L (ref 22–32)
Chloride: 98 mmol/L — ABNORMAL LOW (ref 101–111)
Creatinine, Ser: 1.13 mg/dL (ref 0.61–1.24)
GFR calc Af Amer: >60 mL/min (ref >60)
GFR calc non Af Amer: >60 mL/min (ref >60)
Glucose, Bld: 138 mg/dL — ABNORMAL HIGH (ref 65–99)
Potassium: 3.4 mmol/L — ABNORMAL LOW (ref 3.5–5.1)
SODIUM: 138 mmol/L (ref 135–145)
TOTAL PROTEIN: 7.4 g/dL (ref 6.5–8.1)
Total Bilirubin: 1 mg/dL (ref 0.3–1.2)

## 2015-07-15 LAB — CBC
HEMATOCRIT: 51.2 % (ref 39.0–52.0)
Hemoglobin: 18.4 g/dL — ABNORMAL HIGH (ref 13.0–17.0)
MCH: 32.1 pg (ref 26.0–34.0)
MCHC: 35.9 g/dL (ref 30.0–36.0)
MCV: 89.2 fL (ref 78.0–100.0)
Platelets: 338 10*3/uL (ref 150–400)
RBC: 5.74 MIL/uL (ref 4.22–5.81)
RDW: 12.4 % (ref 11.5–15.5)
WBC: 13 10*3/uL — ABNORMAL HIGH (ref 4.0–10.5)

## 2015-07-15 LAB — LIPASE, BLOOD: Lipase: 31 U/L (ref 11–51)

## 2015-07-15 MED ORDER — HYDROMORPHONE HCL 1 MG/ML IJ SOLN
1.0000 mg | Freq: Once | INTRAMUSCULAR | Status: AC
  Administered 2015-07-15: 1 mg via INTRAVENOUS
  Filled 2015-07-15: qty 1

## 2015-07-15 MED ORDER — OXYCODONE-ACETAMINOPHEN 5-325 MG PO TABS: 1.0000 | ORAL_TABLET | Freq: Four times a day (QID) | ORAL | Status: DC | PRN

## 2015-07-15 MED ORDER — ONDANSETRON HCL 4 MG/2ML IJ SOLN
4.0000 mg | Freq: Once | INTRAMUSCULAR | Status: AC
  Administered 2015-07-15: 4 mg via INTRAVENOUS
  Filled 2015-07-15: qty 2

## 2015-07-15 MED ORDER — OXYCODONE-ACETAMINOPHEN 5-325 MG PO TABS
1.0000 | ORAL_TABLET | Freq: Once | ORAL | Status: AC
  Administered 2015-07-15: 1 via ORAL
  Filled 2015-07-15: qty 1

## 2015-07-15 MED ORDER — FENTANYL CITRATE (PF) 100 MCG/2ML IJ SOLN
100.0000 ug | Freq: Once | INTRAMUSCULAR | Status: AC
Start: 2015-07-15 — End: 2015-07-15
  Administered 2015-07-15: 100 ug via INTRAVENOUS
  Filled 2015-07-15: qty 2

## 2015-07-15 MED ORDER — MORPHINE SULFATE (PF) 4 MG/ML IV SOLN
4.0000 mg | Freq: Once | INTRAVENOUS | Status: AC
Start: 2015-07-15 — End: 2015-07-15
  Administered 2015-07-15: 4 mg via INTRAVENOUS
  Filled 2015-07-15: qty 1

## 2015-07-15 MED ORDER — SODIUM CHLORIDE 0.9 % IV BOLUS (SEPSIS)
1000.0000 mL | Freq: Once | INTRAVENOUS | Status: AC
  Administered 2015-07-15: 1000 mL via INTRAVENOUS

## 2015-07-15 MED ORDER — ONDANSETRON 4 MG PO TBDP
8.0000 mg | ORAL_TABLET | Freq: Once | ORAL | Status: AC
  Administered 2015-07-15: 8 mg via ORAL
  Filled 2015-07-15: qty 2

## 2015-07-15 NOTE — ED Notes (Signed)
The pt had a vicodin 2 hours ago but he threw it  Back up

## 2015-07-15 NOTE — ED Notes (Signed)
The pt is c/o acute sharp epigastric pain after he ate greasy food around 2100 he has gb issues and he has vomited  X 3

## 2015-07-15 NOTE — ED Provider Notes (Signed)
CSN: [id number]     Arrival date & time 07/15/15  0127 History  By signing my name below, I, Ronney Lion, attest that this documentation has been prepared under the direction and in the presence of Shon Baton, MD. Electronically Signed: Ronney Lion, ED Scribe. 07/15/2015. 5:55 AM.   Chief Complaint  Patient presents with  . Abdominal Pain   The history is provided by the patient. No language interpreter was used.    HPI Comments: Douglas Whitney is a 45 y.o. male with a history of HTN, sinusitis, and seasonal allergies, who presents to the Emergency Department complaining of constant, worsening, 10/10, intermittently dull and sharp, epigastric pain that began about 3 hours ago after eating greasy food. He notes associated vomiting. He states he has similar episodes of pain, triggered by greasy food or drinking tea, but this has been the most severe episode so far. He states these episodes started in 2013; he had been seen multiple times and been evaluated multiple times for gallbladder issues, but no known cause for his symptoms have been found. He denies diarrhea, hematuria, or back pain.   Review of the patient's chart shows that he has had a HIDA scan, CT scan, and ultrasound within the last 2 years.  Past Medical History  Diagnosis Date  . Hypertension   . Sinusitis   . Seasonal allergies   . Snores   . Wears glasses    Past Surgical History  Procedure Laterality Date  . Wisdom tooth extraction    . Finger amputation  1983    cut off tip lt index finger  . Orif finger fracture  1984    lt middle  . Nasal septoplasty w/ turbinoplasty Bilateral 04/14/2013    Procedure: NASAL SEPTOPLASTY BILATERAL INFERIOR TURBINATE REDUCTION ;  Surgeon: Osborn Coho, MD;  Location: [facility];  Service: ENT;  Laterality: Bilateral;   No family history on file. Social History  Substance Use Topics  . Smoking status: Former Smoker    Quit date: 04/10/1993  . Smokeless  tobacco: None  . Alcohol Use: Yes     Comment: rare    Review of Systems  Constitutional: Negative for fever.  Respiratory: Negative for shortness of breath.   Cardiovascular: Negative for chest pain.  Gastrointestinal: Positive for nausea, vomiting and abdominal pain. Negative for diarrhea.  Genitourinary: Negative for hematuria.  Musculoskeletal: Negative for back pain.  All other systems reviewed and are negative.   Allergies  Review of patient's allergies indicates no known allergies.  Home Medications   Prior to Admission medications   Medication Sig Start Date End Date Taking? Authorizing Provider  hydrochlorothiazide (HYDRODIURIL) 25 MG tablet Take 25 mg by mouth daily.   Yes Historical Provider, MD  Multiple Vitamin (MULTIVITAMIN WITH MINERALS) TABS tablet Take 1 tablet by mouth daily.   Yes Historical Provider, MD  omega-3 acid ethyl esters (LOVAZA) 1 G capsule Take 2 g by mouth daily.   Yes Historical Provider, MD  oxyCODONE-acetaminophen (PERCOCET/ROXICET) 5-325 MG tablet Take 1-2 tablets by mouth every 6 (six) hours as needed for severe pain. 07/15/15   Shon Baton, MD   BP 156/89 mmHg  Pulse 62  Temp(Src) 98.1 F (36.7 C) (Oral)  Resp 16  Ht  (1.93 m)  Wt 270 lb (122.471 kg)  BMI 32.88 kg/m2  SpO2 93% Physical Exam  Constitutional: He is oriented to person, place, and time. He appears well-developed and well-nourished.  Uncomfortable appearing  HENT:  Head: Normocephalic and atraumatic.  Cardiovascular: Normal rate, regular rhythm and normal heart sounds.   No murmur heard. Pulmonary/Chest: Effort normal and breath sounds normal. No respiratory distress. He has no wheezes.  Abdominal: Soft. Bowel sounds are normal. There is tenderness. There is guarding. There is no rebound.  Right upper quadrant tenderness to palpation with voluntary guarding  Musculoskeletal: Normal range of motion. He exhibits no edema.  Neurological: He is alert and oriented to  person, place, and time.  Skin: Skin is warm and dry.  Psychiatric: He has a normal mood and affect. His behavior is normal.  Nursing note and vitals reviewed.   ED Course  Procedures (including critical care time)  DIAGNOSTIC STUDIES: Oxygen Saturation is 100% on RA, normal by my interpretation.    COORDINATION OF CARE: 2:37 AM - Discussed treatment plan with pt at bedside which includes diagnostic testing. Pt verbalized understanding and agreed to plan.   Labs Review Labs Reviewed  COMPREHENSIVE METABOLIC PANEL - Abnormal; Notable for the following:    Potassium 3.4 (*)    Chloride 98 (*)    Glucose, Bld 138 (*)    All other components within normal limits  CBC - Abnormal; Notable for the following:    WBC 13.0 (*)    Hemoglobin 18.4 (*)    All other components within normal limits  LIPASE, BLOOD  URINALYSIS, ROUTINE W REFLEX MICROSCOPIC (NOT AT [facility])    Imaging Review US Abdomen Limited Ruq  07/15/2015  CLINICAL DATA:  Acute onset of right upper quadrant abdominal pain. Initial encounter. EXAM: US ABDOMEN LIMITED - RIGHT UPPER QUADRANT COMPARISON:  CT of the abdomen and pelvis performed 10/30/2014 FINDINGS: Gallbladder: Stones are noted within the gallbladder, measuring up to 1.9 cm in size. The gallbladder wall is borderline normal in thickness. No pericholecystic fluid is seen. No ultrasonographic Murphy's sign is elicited. Common bile duct: Diameter: 0.4 cm, within normal limits in caliber. Liver: No focal lesion identified. Within normal limits in parenchymal echogenicity. IMPRESSION: Cholelithiasis, without evidence for obstruction or cholecystitis. Electronically Signed   By: Roanna Raider M.D.   On: 07/15/2015 03:58   I have personally reviewed and evaluated these images and lab results as part of my medical decision-making.   EKG Interpretation None      MDM   Final diagnoses:  Abdominal pain  Biliary colic   Patient presents with persistent and worsening  epigastric and right upper quadrant pain. Reports history of the same with extensive workup. I reviewed the patient's chart. He has had a HIDA scan and CT. Reports he has follow-up soon. He is uncomfortable but nontoxic. Basic labwork obtained. Patient given pain and nausea medication. Vital signs are reassuring. Repeat ultrasound of the right upper quadrant shows cholelithiasis without evidence of cholecystitis. On recheck, patient reports some improvement of pain with pain medication. He has no evidence of cholecystitis at this time and his LFTs are normal. Discuss with patient pain management at home and follow-up with surgery for outpatient evaluation. Patient is agreeable to plan.  After history, exam, and medical workup I feel the patient has been appropriately medically screened and is safe for discharge home. Pertinent diagnoses were discussed with the patient. Patient was given return precautions.  I personally performed the services described in this documentation, which was scribed in my presence. The recorded information has been reviewed and is accurate.     Shon Baton, MD 07/15/15 [phone]

## 2015-07-15 NOTE — Discharge Instructions (Signed)
Biliary Colic °Biliary colic is a pain in the upper abdomen. The pain: °· Is usually felt on the right side of the abdomen, but it may also be felt in the center of the abdomen, just below the breastbone (sternum). °· May spread back toward the right shoulder blade. °· May be steady or irregular. °· May be accompanied by nausea and vomiting. °Most of the time, the pain goes away in 1-5 hours. After the most intense pain passes, the abdomen may continue to ache mildly for about 24 hours. °Biliary colic is caused by a blockage in the bile duct. The bile duct is a pathway that carries bile--a liquid that helps to digest fats--from the gallbladder to the small intestine. Biliary colic usually occurs after eating, when the digestive system demands bile. The pain develops when muscle cells contract forcefully to try to move the blockage so that bile can get by. °HOME CARE INSTRUCTIONS °· Take medicines only as directed by your health care provider. °· Drink enough fluid to keep your urine clear or pale yellow. °· Avoid fatty, greasy, and fried foods. These kinds of foods increase your body's demand for bile. °· Avoid any foods that make your pain worse. °· Avoid overeating. °· Avoid having a large meal after fasting. °SEEK MEDICAL CARE IF: °· You develop a fever. °· Your pain gets worse. °· You vomit. °· You develop nausea that prevents you from eating and drinking. °SEEK IMMEDIATE MEDICAL CARE IF: °· You suddenly develop a fever and shaking chills. °· You develop a yellowish discoloration (jaundice) of: °¨ Skin. °¨ Whites of the eyes. °¨ Mucous membranes. °· You have continuous or severe pain that is not relieved with medicines. °· You have nausea and vomiting that is not relieved with medicines. °· You develop dizziness or you faint. °  °This information is not intended to replace advice given to you by your health care provider. Make sure you discuss any questions you have with your health care provider. °  °Document  Released: 10/26/2005 Document Revised: 10/09/2014 Document Reviewed: 03/06/2014 °Elsevier Interactive Patient Education ©2016 [employer] Inc[employer] ° °

## 2015-07-16 ENCOUNTER — Other Ambulatory Visit: Payer: BC Managed Care – PPO

## 2015-07-26 NOTE — Pre-Procedure Instructions (Signed)
Douglas Whitney  07/26/2015      [facility] - [name], [place] - [address] [name] [address] Phone: [phone] Fax: [phone]    Your procedure is scheduled on Feb 23  Report to [facility] Admitting at 900 A.M.  Call this number if you have problems the morning of surgery:  [phone]   Remember:  Do not eat food or drink liquids after midnight.  Take these medicines the morning of surgery with A SIP OF WATER Oxycodone-acetaminophen (Percocet)  Stop taking aspirin, Ibuprofen, Advil, Motrin, BC's, Goody's, Aleve, Herbal medications, Fish oil   Do not wear jewelry, make-up or nail polish.  Do not wear lotions, powders, or perfumes.  You may wear deodorant.  Do not shave 48 hours prior to surgery.  Men may shave face and neck.  Do not bring valuables to the hospital.  [facility] is not responsible for any belongings or valuables.  Contacts, dentures or bridgework may not be worn into surgery.  Leave your suitcase in the car.  After surgery it may be brought to your room.  For patients admitted to the hospital, discharge time will be determined by your treatment team.  Patients discharged the day of surgery will not be allowed to drive home.   Special instructions:  [facility] - Preparing for Surgery  Before surgery, you can play an important role.  Because skin is not sterile, your skin needs to be as free of germs as possible.  You can reduce the number of germs on you skin by washing with CHG (chlorahexidine gluconate) soap before surgery.  CHG is an antiseptic cleaner which kills germs and bonds with the skin to continue killing germs even after washing.  Please DO NOT use if you have an allergy to CHG or antibacterial soaps.  If your skin becomes reddened/irritated stop using the CHG and inform your nurse when you arrive at Short Stay.  Do not shave (including legs and underarms) for at least 48 hours prior  to the first CHG shower.  You may shave your face.  Please follow these instructions carefully:   1.  Shower with CHG Soap the night before surgery and the  morning of Surgery.  2.  If you choose to wash your hair, wash your hair first as usual with your  normal shampoo.  3.  After you shampoo, rinse your hair and body thoroughly to remove the  Shampoo.  4.  Use CHG as you would any other liquid soap.  You can apply chg directly  to the skin and wash gently with scrungie or a clean washcloth.  5.  Apply the CHG Soap to your body ONLY FROM THE NECK DOWN.   Do not use on open wounds or open sores.  Avoid contact with your eyes,  ears, mouth and genitals (private parts).  Wash genitals (private parts) with your normal soap.  6.  Wash thoroughly, paying special attention to the area where your surgery will be performed.  7.  Thoroughly rinse your body with warm water from the neck down.  8.  DO NOT shower/wash with your normal soap after using and rinsing off   the CHG Soap.  9.  Pat yourself dry with a clean towel.            10.  Wear clean pajamas.            11.  Place clean  sheets on your bed the night of your first shower and do not  sleep with pets.  Day of Surgery  Do not apply any lotions/deoderants the morning of surgery.  Please wear clean clothes to the hospital/surgery center.     Please read over the following fact sheets that you were given. Pain Booklet, Coughing and Deep Breathing and Surgical Site Infection Prevention

## 2015-07-28 ENCOUNTER — Encounter (HOSPITAL_COMMUNITY): Payer: Self-pay | Admitting: Surgery

## 2015-07-28 DIAGNOSIS — K8010 Calculus of gallbladder with chronic cholecystitis without obstruction: Secondary | ICD-10-CM | POA: Diagnosis present

## 2015-07-28 NOTE — H&P (Signed)
General Surgery [facility]  Douglas Whitney. Douglas Whitney DOB: [birth year] Married / Language: English / Race: White Male  History of Present Illness Patient words: gallbladder. The patient is a 45 year old male who presents for evaluation of gall stones. Patient is referred by Dr. Aida Puffer and from the emergency department at [facility] for treatment of symptomatic cholelithiasis. Patient has had intermittent episodes of right upper quadrant and epigastric abdominal pain over the past few years. Typical episodes last for several hours and is associated with nausea and vomiting. Patient denies radiation of pain. He denies fevers or chills. He denies jaundice or acholic stools. There is a strong family history of gallbladder disease. Patient has been evaluated by gastroenterology and by his primary care physician. Ultrasound performed today at [facility] showed multiple gallstones within the gallbladder measuring up to 1.9 cm in size. Gallbladder wall was borderline thickened. There was no pericholecystic fluid. There was no Murphy sign. There is no biliary dilatation. Patient is referred for consideration for cholecystectomy for treatment of symptomatic cholelithiasis. Patient has had no prior abdominal surgery.  Other Problems Back Pain Cholelithiasis  Diagnostic Studies History Colonoscopy never  Allergies No Known Drug Allergies02/11/2015  Medication History HydroCHLOROthiazide (  Tablet, Oral) Active. Aspirin (  Tablet DR, Oral) Active. Lovaza (1GM Capsule, Oral) Active. Medications Reconciled Multivitamins/Minerals (Oral) Active.  Social History Alcohol use Remotely quit alcohol use. Caffeine use Carbonated beverages, Tea. No drug use Tobacco use Former smoker.  Family History Hypertension Father.  Review of Systems General Not Present- Appetite Loss, Chills, Fatigue, Fever, Night Sweats, Weight Gain and Weight Loss. Skin Not  Present- Change in Wart/Mole, Dryness, Hives, Jaundice, New Lesions, Non-Healing Wounds, Rash and Ulcer. HEENT Not Present- Earache, Hearing Loss, Hoarseness, Nose Bleed, Oral Ulcers, Ringing in the Ears, Seasonal Allergies, Sinus Pain, Sore Throat, Visual Disturbances, Wears glasses/contact lenses and Yellow Eyes. Respiratory Not Present- Bloody sputum, Chronic Cough, Difficulty Breathing, Snoring and Wheezing. Breast Not Present- Breast Mass, Breast Pain, Nipple Discharge and Skin Changes. Cardiovascular Not Present- Chest Pain, Difficulty Breathing Lying Down, Leg Cramps, Palpitations, Rapid Heart Rate, Shortness of Breath and Swelling of Extremities. Gastrointestinal Not Present- Abdominal Pain, Bloating, Bloody Stool, Change in Bowel Habits, Chronic diarrhea, Constipation, Difficulty Swallowing, Excessive gas, Gets full quickly at meals, Hemorrhoids, Indigestion, Nausea, Rectal Pain and Vomiting. Male Genitourinary Not Present- Blood in Urine, Change in Urinary Stream, Frequency, Impotence, Nocturia, Painful Urination, Urgency and Urine Leakage. Musculoskeletal Present- Back Pain. Not Present- Joint Pain, Joint Stiffness, Muscle Pain, Muscle Weakness and Swelling of Extremities. Neurological Not Present- Decreased Memory, Fainting, Headaches, Numbness, Seizures, Tingling, Tremor, Trouble walking and Weakness. Psychiatric Not Present- Anxiety, Bipolar, Change in Sleep Pattern, Depression, Fearful and Frequent crying. Endocrine Not Present- Cold Intolerance, Excessive Hunger, Hair Changes, Heat Intolerance, Hot flashes and New Diabetes.  Vitals Weight: 284.8 lb Height: 76in Body Surface Area: 2.57 m Body Mass Index: 34.67 kg/m  Temp.: 6F(Oral)  Pulse: 68 (Regular)  BP: 122/84 (Sitting, Left Arm, Standard)  Physical Exam  General - appears comfortable, no distress; not diaphorectic  HEENT - normocephalic; sclerae clear, gaze conjugate; mucous membranes moist, dentition good;  voice normal  Neck - symmetric on extension; no palpable anterior or posterior cervical adenopathy; no palpable masses in the thyroid bed  Chest - clear bilaterally without rhonchi, rales, or wheeze  Cor - regular rhythm with normal rate; no significant murmur  Abd - soft without distension; no surgical incision; palpation in the right upper quadrant does show  mild to moderate tenderness with voluntary guarding; no palpable mass; no hepatosplenomegaly  Ext - non-tender without significant edema or lymphedema  Neuro - grossly intact; no tremor   Assessment & Plan   CALCULUS OF GALLBLADDER WITH CHRONIC CHOLECYSTITIS WITHOUT OBSTRUCTION (K80.10)  Pt Education - Pamphlet Given - Laparoscopic Gallbladder Surgery: discussed with patient and provided information.  Patient presents with symptomatic cholelithiasis. He and his wife are provided with written literature on gallbladder disease to review at home.  I have recommended proceeding with laparoscopic cholecystectomy with intraoperative cholangiography for treatment of symptomatic cholelithiasis and chronic cholecystitis. We have discussed the risk and benefits of the procedure including the potential for open surgery. We discussed an overnight stay at the hospital. We discussed approximately 1-2 weeks out of work following the procedure. We discussed restrictions on his diet until the time of surgery. Patient and his wife understand and wish to proceed with surgery in the near future.  The risks and benefits of the procedure have been discussed at length with the patient. The patient understands the proposed procedure, potential alternative treatments, and the course of recovery to be expected. All of the patient's questions have been answered at this time. The patient wishes to proceed with surgery.  Velora Heckler, MD, [facility] Office: [phone]

## 2015-07-29 ENCOUNTER — Encounter (HOSPITAL_COMMUNITY)
Admission: RE | Admit: 2015-07-29 | Discharge: 2015-07-29 | Disposition: A | Discharge status: Home / Self Care | Payer: BC Managed Care – PPO | Source: Ambulatory Visit | Attending: Surgery | Admitting: Surgery

## 2015-07-29 ENCOUNTER — Encounter (HOSPITAL_COMMUNITY): Payer: Self-pay

## 2015-07-29 DIAGNOSIS — Z7982 Long term (current) use of aspirin: Secondary | ICD-10-CM | POA: Diagnosis not present

## 2015-07-29 DIAGNOSIS — Z87891 Personal history of nicotine dependence: Secondary | ICD-10-CM | POA: Diagnosis not present

## 2015-07-29 DIAGNOSIS — K8010 Calculus of gallbladder with chronic cholecystitis without obstruction: Secondary | ICD-10-CM | POA: Diagnosis not present

## 2015-07-29 DIAGNOSIS — Z79899 Other long term (current) drug therapy: Secondary | ICD-10-CM | POA: Diagnosis not present

## 2015-07-29 DIAGNOSIS — I10 Essential (primary) hypertension: Secondary | ICD-10-CM | POA: Diagnosis not present

## 2015-07-29 DIAGNOSIS — K8020 Calculus of gallbladder without cholecystitis without obstruction: Secondary | ICD-10-CM | POA: Diagnosis present

## 2015-07-29 HISTORY — DX: Gastro-esophageal reflux disease without esophagitis: K21.9

## 2015-07-29 LAB — CBC
HCT: 49.7 % (ref 39.0–52.0)
Hemoglobin: 17.5 g/dL — ABNORMAL HIGH (ref 13.0–17.0)
MCH: 32.3 pg (ref 26.0–34.0)
MCHC: 35.2 g/dL (ref 30.0–36.0)
MCV: 91.9 fL (ref 78.0–100.0)
PLATELETS: 274 10*3/uL (ref 150–400)
RBC: 5.41 MIL/uL (ref 4.22–5.81)
RDW: 12.8 % (ref 11.5–15.5)
WBC: 8.5 10*3/uL (ref 4.0–10.5)

## 2015-07-29 LAB — BASIC METABOLIC PANEL
Anion gap: 12 (ref 5–15)
BUN: 14 mg/dL (ref 6–20)
CO2: 27 mmol/L (ref 22–32)
CREATININE: 0.89 mg/dL (ref 0.61–1.24)
Calcium: 9.8 mg/dL (ref 8.9–10.3)
Chloride: 102 mmol/L (ref 101–111)
GFR calc non Af Amer: >60 mL/min (ref >60)
Glucose, Bld: 95 mg/dL (ref 65–99)
Potassium: 3.9 mmol/L (ref 3.5–5.1)
SODIUM: 141 mmol/L (ref 135–145)

## 2015-07-29 NOTE — Progress Notes (Signed)
PCP - Dr. Aida Puffer Cardiologist - denies  EKG- 07/29/15 CXR - denies  Echo/stress test/cardiac cath - denies  Patient denies chest pain and shortness of breath at PAT appointment.

## 2015-07-30 NOTE — Progress Notes (Signed)
Anesthesia Chart Review:  Pt is a 45 year old male scheduled80 for laparoscopic cholecystectomy with intraoperative cholangiogram on 08/01/2015 with Dr. Gerrit Friends.   PMH includes:  HTN, GERD. Former smoker. BMI 34. S/p nasal septoplasty 04/14/13.   Medications include: htctz.   Preoperative labs reviewed.    EKG 07/29/15: NSR. Possible Left atrial enlargement. Inferior infarct, age undetermined. Cannot rule out Anterior infarct, age undetermined.  If no changes, I anticipate pt can proceed with surgery as scheduled.   Rica Mast, FNP-BC [facility] Short Stay Surgical Center/Anesthesiology Phone: [phone] 07/30/2015 2:55 PM

## 2015-07-31 MED ORDER — DEXTROSE 5 % IV SOLN
3.0000 g | INTRAVENOUS | Status: AC
  Administered 2015-08-01: 3 g via INTRAVENOUS
  Filled 2015-07-31: qty 3000

## 2015-08-01 ENCOUNTER — Ambulatory Visit (HOSPITAL_COMMUNITY): Payer: BC Managed Care – PPO | Admitting: Emergency Medicine

## 2015-08-01 ENCOUNTER — Ambulatory Visit (HOSPITAL_COMMUNITY): Payer: BC Managed Care – PPO

## 2015-08-01 ENCOUNTER — Ambulatory Visit (HOSPITAL_COMMUNITY): Payer: BC Managed Care – PPO | Admitting: Anesthesiology

## 2015-08-01 ENCOUNTER — Encounter (HOSPITAL_COMMUNITY): Payer: Self-pay | Admitting: Surgery

## 2015-08-01 ENCOUNTER — Observation Stay (HOSPITAL_COMMUNITY)
Admission: RE | Admit: 2015-08-01 | Discharge: 2015-08-01 | Disposition: A | Discharge status: Home / Self Care | Payer: BC Managed Care – PPO | Source: Ambulatory Visit | Attending: Surgery | Admitting: Surgery

## 2015-08-01 ENCOUNTER — Encounter (HOSPITAL_COMMUNITY)
Admission: RE | Disposition: A | Discharge status: Home / Self Care | Payer: Self-pay | Source: Ambulatory Visit | Attending: Surgery

## 2015-08-01 DIAGNOSIS — Z79899 Other long term (current) drug therapy: Secondary | ICD-10-CM | POA: Insufficient documentation

## 2015-08-01 DIAGNOSIS — I10 Essential (primary) hypertension: Secondary | ICD-10-CM | POA: Insufficient documentation

## 2015-08-01 DIAGNOSIS — Z87891 Personal history of nicotine dependence: Secondary | ICD-10-CM | POA: Insufficient documentation

## 2015-08-01 DIAGNOSIS — K8010 Calculus of gallbladder with chronic cholecystitis without obstruction: Secondary | ICD-10-CM | POA: Diagnosis not present

## 2015-08-01 DIAGNOSIS — Z7982 Long term (current) use of aspirin: Secondary | ICD-10-CM | POA: Insufficient documentation

## 2015-08-01 DIAGNOSIS — Z419 Encounter for procedure for purposes other than remedying health state, unspecified: Secondary | ICD-10-CM

## 2015-08-01 HISTORY — PX: CHOLECYSTECTOMY: SHX55

## 2015-08-01 SURGERY — LAPAROSCOPIC CHOLECYSTECTOMY WITH INTRAOPERATIVE CHOLANGIOGRAM
Anesthesia: General

## 2015-08-01 MED ORDER — ONDANSETRON HCL 4 MG/2ML IJ SOLN
4.0000 mg | Freq: Four times a day (QID) | INTRAMUSCULAR | Status: DC | PRN
  Filled 2015-08-01: qty 2

## 2015-08-01 MED ORDER — ONDANSETRON HCL 4 MG/2ML IJ SOLN
INTRAMUSCULAR | Status: DC | PRN
  Administered 2015-08-01: 4 mg via INTRAVENOUS

## 2015-08-01 MED ORDER — NEOSTIGMINE METHYLSULFATE 10 MG/10ML IV SOLN
INTRAVENOUS | Status: DC | PRN
  Administered 2015-08-01: 4 mg via INTRAVENOUS

## 2015-08-01 MED ORDER — KCL IN DEXTROSE-NACL 20-5-0.45 MEQ/L-%-% IV SOLN
INTRAVENOUS | Status: DC
  Filled 2015-08-01: qty 1000

## 2015-08-01 MED ORDER — ACETAMINOPHEN 650 MG RE SUPP
650.0000 mg | Freq: Four times a day (QID) | RECTAL | Status: DC | PRN
  Filled 2015-08-01: qty 1

## 2015-08-01 MED ORDER — ONDANSETRON 4 MG PO TBDP
4.0000 mg | ORAL_TABLET | Freq: Four times a day (QID) | ORAL | Status: DC | PRN
  Filled 2015-08-01: qty 1

## 2015-08-01 MED ORDER — MIDAZOLAM HCL 5 MG/5ML IJ SOLN
INTRAMUSCULAR | Status: DC | PRN
  Administered 2015-08-01: 2 mg via INTRAVENOUS

## 2015-08-01 MED ORDER — ESMOLOL HCL 100 MG/10ML IV SOLN
INTRAVENOUS | Status: DC | PRN
  Administered 2015-08-01: 20 mg via INTRAVENOUS

## 2015-08-01 MED ORDER — BUPIVACAINE-EPINEPHRINE 0.25% -1:200000 IJ SOLN
INTRAMUSCULAR | Status: DC | PRN
  Administered 2015-08-01: 20 mL

## 2015-08-01 MED ORDER — BUPIVACAINE-EPINEPHRINE (PF) 0.25% -1:200000 IJ SOLN
INTRAMUSCULAR | Status: AC
  Filled 2015-08-01: qty 30

## 2015-08-01 MED ORDER — FENTANYL CITRATE (PF) 250 MCG/5ML IJ SOLN
INTRAMUSCULAR | Status: AC
  Filled 2015-08-01: qty 5

## 2015-08-01 MED ORDER — ROCURONIUM BROMIDE 50 MG/5ML IV SOLN
INTRAVENOUS | Status: AC
  Filled 2015-08-01: qty 1

## 2015-08-01 MED ORDER — PROPOFOL 10 MG/ML IV BOLUS
INTRAVENOUS | Status: AC
  Filled 2015-08-01: qty 20

## 2015-08-01 MED ORDER — GLYCOPYRROLATE 0.2 MG/ML IJ SOLN
INTRAMUSCULAR | Status: DC | PRN
  Administered 2015-08-01: 0.6 mg via INTRAVENOUS

## 2015-08-01 MED ORDER — PROPOFOL 10 MG/ML IV BOLUS
INTRAVENOUS | Status: DC | PRN
  Administered 2015-08-01: 200 mg via INTRAVENOUS

## 2015-08-01 MED ORDER — OXYCODONE-ACETAMINOPHEN 5-325 MG PO TABS: 1.0000 | ORAL_TABLET | ORAL | Status: DC | PRN

## 2015-08-01 MED ORDER — ONDANSETRON HCL 4 MG/2ML IJ SOLN
INTRAMUSCULAR | Status: AC
  Filled 2015-08-01: qty 2

## 2015-08-01 MED ORDER — OXYCODONE HCL 5 MG PO TABS: 5.0000 mg | ORAL_TABLET | ORAL | Status: DC | PRN

## 2015-08-01 MED ORDER — HYDROMORPHONE HCL 1 MG/ML IJ SOLN: 0.2500 mg | INTRAMUSCULAR | Status: DC | PRN

## 2015-08-01 MED ORDER — SODIUM CHLORIDE 0.9 % IR SOLN
Status: DC | PRN
  Administered 2015-08-01: 1000 mL

## 2015-08-01 MED ORDER — MIDAZOLAM HCL 2 MG/2ML IJ SOLN
INTRAMUSCULAR | Status: AC
  Filled 2015-08-01: qty 2

## 2015-08-01 MED ORDER — LACTATED RINGERS IV SOLN
INTRAVENOUS | Status: DC
  Administered 2015-08-01 (×3): via INTRAVENOUS

## 2015-08-01 MED ORDER — LIDOCAINE HCL (CARDIAC) 20 MG/ML IV SOLN
INTRAVENOUS | Status: AC
  Filled 2015-08-01: qty 5

## 2015-08-01 MED ORDER — LIDOCAINE HCL (CARDIAC) 20 MG/ML IV SOLN
INTRAVENOUS | Status: DC | PRN
  Administered 2015-08-01: 60 mg via INTRAVENOUS

## 2015-08-01 MED ORDER — ACETAMINOPHEN 325 MG PO TABS
ORAL_TABLET | ORAL | Status: AC
  Filled 2015-08-01: qty 2

## 2015-08-01 MED ORDER — DEXAMETHASONE SODIUM PHOSPHATE 4 MG/ML IJ SOLN
INTRAMUSCULAR | Status: DC | PRN
  Administered 2015-08-01: 8 mg via INTRAVENOUS

## 2015-08-01 MED ORDER — KETOROLAC TROMETHAMINE 30 MG/ML IJ SOLN: 30.0000 mg | Freq: Once | INTRAMUSCULAR | Status: DC

## 2015-08-01 MED ORDER — PROMETHAZINE HCL 25 MG/ML IJ SOLN: 6.2500 mg | INTRAMUSCULAR | Status: DC | PRN

## 2015-08-01 MED ORDER — IOHEXOL 300 MG/ML  SOLN
INTRAMUSCULAR | Status: DC | PRN
  Administered 2015-08-01: 20 mL

## 2015-08-01 MED ORDER — DEXAMETHASONE SODIUM PHOSPHATE 4 MG/ML IJ SOLN
INTRAMUSCULAR | Status: AC
  Filled 2015-08-01: qty 2

## 2015-08-01 MED ORDER — FENTANYL CITRATE (PF) 100 MCG/2ML IJ SOLN
INTRAMUSCULAR | Status: DC | PRN
  Administered 2015-08-01: 150 ug via INTRAVENOUS
  Administered 2015-08-01: 100 ug via INTRAVENOUS

## 2015-08-01 MED ORDER — 0.9 % SODIUM CHLORIDE (POUR BTL) OPTIME
TOPICAL | Status: DC | PRN
  Administered 2015-08-01: 1000 mL

## 2015-08-01 MED ORDER — HYDROCODONE-ACETAMINOPHEN 7.5-325 MG PO TABS: 1.0000 | ORAL_TABLET | Freq: Once | ORAL | Status: DC | PRN

## 2015-08-01 MED ORDER — ROCURONIUM BROMIDE 100 MG/10ML IV SOLN
INTRAVENOUS | Status: DC | PRN
  Administered 2015-08-01: 50 mg via INTRAVENOUS

## 2015-08-01 MED ORDER — ACETAMINOPHEN 325 MG PO TABS
650.0000 mg | ORAL_TABLET | Freq: Four times a day (QID) | ORAL | Status: DC | PRN
  Administered 2015-08-01: 650 mg via ORAL
  Filled 2015-08-01: qty 2

## 2015-08-01 SURGICAL SUPPLY — 48 items
APPLIER CLIP ROT 10 11.4 M/L (STAPLE) ×3
BENZOIN TINCTURE PRP APPL 2/3 (GAUZE/BANDAGES/DRESSINGS) ×3 IMPLANT
BLADE CLIPPER SURG (BLADE) ×3 IMPLANT
BLADE SURG CLIPPER 3M 9600 (MISCELLANEOUS) IMPLANT
CANISTER SUCTION 2500CC (MISCELLANEOUS) ×3 IMPLANT
CHLORAPREP W/TINT 26ML (MISCELLANEOUS) ×3 IMPLANT
CLIP APPLIE ROT 10 11.4 M/L (STAPLE) ×1 IMPLANT
CLOSURE WOUND 1/2 X4 (GAUZE/BANDAGES/DRESSINGS) ×1
COVER MAYO STAND STRL (DRAPES) ×3 IMPLANT
COVER SURGICAL LIGHT HANDLE (MISCELLANEOUS) ×3 IMPLANT
DRAPE C-ARM 42X72 X-RAY (DRAPES) ×3 IMPLANT
ELECT REM PT RETURN 9FT ADLT (ELECTROSURGICAL) ×3
ELECTRODE REM PT RTRN 9FT ADLT (ELECTROSURGICAL) ×1 IMPLANT
ENDOLOOP SUT PDS II  0 18 (SUTURE) ×4
ENDOLOOP SUT PDS II 0 18 (SUTURE) ×2 IMPLANT
GAUZE SPONGE 2X2 8PLY STRL LF (GAUZE/BANDAGES/DRESSINGS) ×1 IMPLANT
GLOVE BIOGEL PI IND STRL 7.0 (GLOVE) ×3 IMPLANT
GLOVE BIOGEL PI IND STRL 8 (GLOVE) ×1 IMPLANT
GLOVE BIOGEL PI INDICATOR 7.0 (GLOVE) ×6
GLOVE BIOGEL PI INDICATOR 8 (GLOVE) ×2
GLOVE SURG ORTHO 8.0 STRL STRW (GLOVE) ×3 IMPLANT
GLOVE SURG SS PI 6.5 STRL IVOR (GLOVE) ×3 IMPLANT
GLOVE SURG SS PI 7.0 STRL IVOR (GLOVE) ×6 IMPLANT
GLOVE SURG SS PI 8.0 STRL IVOR (GLOVE) ×3 IMPLANT
GOWN STRL REUS W/ TWL LRG LVL3 (GOWN DISPOSABLE) ×3 IMPLANT
GOWN STRL REUS W/ TWL XL LVL3 (GOWN DISPOSABLE) ×1 IMPLANT
GOWN STRL REUS W/TWL LRG LVL3 (GOWN DISPOSABLE) ×6
GOWN STRL REUS W/TWL XL LVL3 (GOWN DISPOSABLE) ×2
KIT BASIN OR (CUSTOM PROCEDURE TRAY) ×3 IMPLANT
KIT ROOM TURNOVER OR (KITS) ×3 IMPLANT
NS IRRIG 1000ML POUR BTL (IV SOLUTION) ×3 IMPLANT
PAD ARMBOARD 7.5X6 YLW CONV (MISCELLANEOUS) ×3 IMPLANT
POUCH SPECIMEN RETRIEVAL 10MM (ENDOMECHANICALS) IMPLANT
SCISSORS LAP 5X35 DISP (ENDOMECHANICALS) ×3 IMPLANT
SET CHOLANGIOGRAPH 5 50 .035 (SET/KITS/TRAYS/PACK) ×3 IMPLANT
SET IRRIG TUBING LAPAROSCOPIC (IRRIGATION / IRRIGATOR) ×3 IMPLANT
SLEEVE ENDOPATH XCEL 5M (ENDOMECHANICALS) ×6 IMPLANT
SPECIMEN JAR SMALL (MISCELLANEOUS) ×3 IMPLANT
SPONGE GAUZE 2X2 STER 10/PKG (GAUZE/BANDAGES/DRESSINGS) ×2
STRIP CLOSURE SKIN 1/2X4 (GAUZE/BANDAGES/DRESSINGS) ×2 IMPLANT
SUT MNCRL AB 4-0 PS2 18 (SUTURE) ×3 IMPLANT
TOWEL OR 17X24 6PK STRL BLUE (TOWEL DISPOSABLE) ×3 IMPLANT
TOWEL OR 17X26 10 PK STRL BLUE (TOWEL DISPOSABLE) ×3 IMPLANT
TRAY LAPAROSCOPIC MC (CUSTOM PROCEDURE TRAY) ×3 IMPLANT
TROCAR XCEL BLUNT TIP 100MML (ENDOMECHANICALS) ×3 IMPLANT
TROCAR XCEL NON-BLD 11X100MML (ENDOMECHANICALS) ×3 IMPLANT
TROCAR XCEL NON-BLD 5MMX100MML (ENDOMECHANICALS) ×3 IMPLANT
TUBING INSUFFLATION (TUBING) ×3 IMPLANT

## 2015-08-01 NOTE — Anesthesia Postprocedure Evaluation (Signed)
Anesthesia Post Note  Patient: Douglas Whitney  Procedure(s) Performed: Procedure(s) (LRB): LAPAROSCOPIC CHOLECYSTECTOMY WITH INTRAOPERATIVE CHOLANGIOGRAM (N/A)  Patient location during evaluation: PACU Anesthesia Type: General Level of consciousness: awake and alert Pain management: pain level controlled Vital Signs Assessment: post-procedure vital signs reviewed and stable Respiratory status: spontaneous breathing Cardiovascular status: blood pressure returned to baseline Anesthetic complications: no    Last Vitals:  Filed Vitals:   08/01/15 1530 08/01/15 1600  BP:    Pulse: 65 95  Temp:  36.8 C  Resp: 13 18    Last Pain:  Filed Vitals:   08/01/15 1602  PainSc: Asleep                 Kennieth Rad

## 2015-08-01 NOTE — Discharge Instructions (Signed)
°  [facility] ° °LAPAROSCOPIC SURGERY:  POST-OP INSTRUCTIONS ° °Always review your discharge instruction sheet given to you by the facility where your surgery was performed. ° °A prescription for pain medication may be given to you upon discharge.  Take your pain medication as prescribed.  If narcotic pain medicine is not needed, then you may take acetaminophen (Tylenol) or ibuprofen (Advil) as needed. ° °Take your usually prescribed medications unless otherwise directed. ° °If you need a refill on your pain medication, please contact your pharmacy.  They will contact our office to request authorization. Prescriptions will not be filled after 5 P.M. or on weekends. ° °You should follow a light diet the first few days after arrival home, such as soup and crackers or toast.  Be sure to include plenty of fluids daily. ° °Most patients will experience some swelling and bruising in the area of the incisions.  Ice packs will help.  Swelling and bruising can take several days to resolve.  ° °It is common to experience some constipation after surgery.  Increasing fluid intake and taking a stool softener (such as Colace) will usually help or prevent this problem from occurring.  A mild laxative (Milk of Magnesia or Miralax) should be taken according to package instructions if there has been no bowel movement after 48 hours. ° °You will have steri-strips and a gauze dressing over your incisions.  You may remove the gauze bandage on the second day after surgery, and you may shower at that time.  Leave your steri-strips (small skin tapes) in place directly over the incision.  These strips should remain on the skin for 5-7 days and then be removed.  You may get them wet in the shower and pat them dry. ° °Any sutures or staples will be removed at the office during your follow-up visit. ° °ACTIVITIES:  You may resume regular (light) daily activities beginning the next day - such as daily self-care, walking,  climbing stairs - gradually increasing activities as tolerated.  You may have sexual intercourse when it is comfortable.  Refrain from any heavy lifting or straining until approved by your doctor. ° °You may drive when you are no longer taking prescription pain medication, you can comfortably wear a seatbelt, and you can safely maneuver your car and apply brakes. ° °You should see your doctor in the office for a follow-up appointment approximately 2-3 weeks after your surgery.  Make sure that you call for this appointment within a day or two after you arrive home to insure a convenient appointment time. ° °WHEN TO CALL YOUR DOCTOR: °1. Fever over 101.0 °2. Inability to urinate °3. Continued bleeding from incision °4. Increased pain, redness, or drainage from the incision °5. Increasing abdominal pain ° °The clinic staff is available to answer your questions during regular business hours.  Please don’t hesitate to call and ask to speak to one of the nurses for clinical concerns.  If you have a medical emergency, go to the nearest emergency room or call 911.  A surgeon from [facility] is always on call for the hospital. ° °Graceson Nichelson M. Kanyon Seibold, MD, FACS °[facility] °Office: [phone] °Toll Free:  [phone] °FAX [phone] ° °Website: [email or web address] °

## 2015-08-01 NOTE — Anesthesia Preprocedure Evaluation (Addendum)
Anesthesia Evaluation  Patient identified by MRN, date of birth, ID band Patient awake    Reviewed: Allergy & Precautions, NPO status , Patient's Chart, lab work & pertinent test results  Airway Mallampati: II  TM Distance: >3 FB Neck ROM: Full    Dental  (+) Dental Advisory Given   Pulmonary former smoker,    breath sounds clear to auscultation       Cardiovascular hypertension, Pt. on medications  Rhythm:Regular Rate:Normal     Neuro/Psych negative neurological ROS     GI/Hepatic Neg liver ROS, GERD  ,  Endo/Other  negative endocrine ROS  Renal/GU negative Renal ROS     Musculoskeletal negative musculoskeletal ROS (+)   Abdominal   Peds  Hematology negative hematology ROS (+)   Anesthesia Other Findings   Reproductive/Obstetrics                            Lab Results  Component Value Date   WBC 8.5 07/29/2015   HGB 17.5* 07/29/2015   HCT 49.7 07/29/2015   MCV 91.9 07/29/2015   PLT 274 07/29/2015   Lab Results  Component Value Date   CREATININE 0.89 07/29/2015   BUN 14 07/29/2015   NA 141 07/29/2015   K 3.9 07/29/2015   CL 102 07/29/2015   CO2 27 07/29/2015    Anesthesia Physical Anesthesia Plan  ASA: II  Anesthesia Plan: General   Post-op Pain Management:    Induction: Intravenous  Airway Management Planned: Oral ETT  Additional Equipment:   Intra-op Plan:   Post-operative Plan: Extubation in OR  Informed Consent: I have reviewed the patients History and Physical, chart, labs and discussed the procedure including the risks, benefits and alternatives for the proposed anesthesia with the patient or authorized representative who has indicated his/her understanding and acceptance.   Dental advisory given  Plan Discussed with: CRNA  Anesthesia Plan Comments:         Anesthesia Quick Evaluation

## 2015-08-01 NOTE — Op Note (Signed)
Procedure Note  Pre-operative Diagnosis:  Chronic cholecystitis, cholelithiasis  Post-operative Diagnosis:  same  Surgeon:  Velora Heckler, MD, FACS  Assistant:  none   Procedure:  Laparoscopic cholecystectomy with intra-operative cholangiography  Anesthesia:  General  Estimated Blood Loss:  minimal  Drains: none         Specimen: Gallbladder to pathology  Indications:  The patient is a 45 year old male who presents for evaluation of gall stones. Patient is referred by Dr. Aida Puffer and from the emergency department at [facility] for treatment of symptomatic cholelithiasis. Patient has had intermittent episodes of right upper quadrant and epigastric abdominal pain over the past few years. Typical episodes last for several hours and is associated with nausea and vomiting. Patient denies radiation of pain. He denies fevers or chills. He denies jaundice or acholic stools. There is a strong family history of gallbladder disease. Patient has been evaluated by gastroenterology and by his primary care physician. Ultrasound performed today at [facility] showed multiple gallstones within the gallbladder measuring up to 1.9 cm in size. Gallbladder wall was borderline thickened. There was no pericholecystic fluid. There was no Murphy sign. There is no biliary dilatation. Patient is referred for consideration for cholecystectomy for treatment of symptomatic cholelithiasis. Patient has had no prior abdominal surgery.  Procedure Details:  The patient was seen in the pre-op holding area. The risks, benefits, complications, treatment options, and expected outcomes were previously discussed with the patient. The patient agreed with the proposed plan and has signed the informed consent form.  The patient was brought to the Operating Room, identified as Douglas Whitney and the procedure verified as laparoscopic cholecystectomy with intraoperative cholangiography. A "time out" was completed  and the above information confirmed.  The patient was placed in the supine position. Following induction of general anesthesia, the abdomen was prepped and draped in the usual aseptic fashion.  An incision was made in the skin near the umbilicus. The midline fascia was incised and the peritoneal cavity was entered and a Hasson canula was introduced under direct vision.  The Hasson canula was secured with a 0-Vicryl pursestring suture. Pneumoperitoneum was established with carbon dioxide. Additional trocars were introduced under direct vision along the right costal margin in the midline, mid-clavicular line, and anterior axillary line.   The gallbladder was identified and the fundus grasped and retracted cephalad. Adhesions were taken down bluntly and the electrocautery was utilized as needed, taking care not to injure any adjacent structures. The infundibulum was grasped and retracted laterally, exposing the peritoneum overlying the triangle of Calot. The peritoneum was incised and structures exposed with blunt dissection. The cystic duct was clearly identified, bluntly dissected circumferentially, and clipped at the neck of the gallbladder.  An incision was made in the cystic duct and the cholangiogram catheter introduced. The catheter was secured using an ligaclip.  Real-time cholangiography was performed using C-arm fluoroscopy.  There was rapid filling of a normal caliber common bile duct.  There was reflux of contrast into the left and right hepatic ductal systems.  There was free flow distally into the duodenum without filling defect or obstruction.  The catheter was removed from the peritoneal cavity.  The cystic duct was then ligated with surgical clips and divided. Two Surgiloops were also applied as the cystic duct was somewhat patulous. The cystic artery was identified, dissected circumferentially, ligated with ligaclips, and divided.  The gallbladder was dissected away from the liver bed  using the electrocautery for hemostasis.  The gallbladder was completely removed from the liver and placed into an endocatch bag. The gallbladder was removed in the endocatch bag through the umbilical port site and submitted to pathology for review.  The right upper quadrant was irrigated and the gallbladder bed was inspected. Hemostasis was achieved with the electrocautery.  Pneumoperitoneum was released after viewing removal of the trocars with good hemostasis noted. The umbilical wound was irrigated and the fascia was then closed with the pursestring suture.  Local anesthetic was infiltrated at all port sites. The skin incisions were closed with 4-0 Monocril subcuticular sutures and steri-strips and dressings were applied.  Instrument, sponge, and needle counts were correct at the conclusion of the case.  The patient was awakened from anesthesia and brought to the recovery room in stable condition.  The patient tolerated the procedure well.   Velora Heckler, MD, [facility] Office: [phone]

## 2015-08-01 NOTE — Interval H&P Note (Signed)
History and Physical Interval Note:  08/01/2015 11:40 AM  [name]  has presented today for surgery, with the diagnosis of Chronic choleycystitis ,cholethiasis.  The various methods of treatment have been discussed with the patient and family. After consideration of risks, benefits and other options for treatment, the patient has consented to    Procedure(s): LAPAROSCOPIC CHOLECYSTECTOMY WITH INTRAOPERATIVE CHOLANGIOGRAM (N/A) as a surgical intervention .    The patient's history has been reviewed, patient examined, no change in status, stable for surgery.  I have reviewed the patient's chart and labs.  Questions were answered to the patient's satisfaction.    Velora Heckler, MD, [facility] Office: [phone]    [name]

## 2015-08-01 NOTE — Transfer of Care (Signed)
Immediate Anesthesia Transfer of Care Note  Patient: Douglas Whitney  Procedure(s) Performed: Procedure(s): LAPAROSCOPIC CHOLECYSTECTOMY WITH INTRAOPERATIVE CHOLANGIOGRAM (N/A)  Patient Location: PACU  Anesthesia Type:General  Level of Consciousness: awake, alert , oriented and patient cooperative  Airway & Oxygen Therapy: Patient Spontanous Breathing and Patient connected to nasal cannula oxygen  Post-op Assessment: Report given to RN and Post -op Vital signs reviewed and stable  Post vital signs: Reviewed and stable  Last Vitals:  Filed Vitals:   08/01/15 0904 08/01/15 1321  BP: 145/87 136/83  Pulse: 72 80  Temp: 36.2 C 36.7 C  Resp: 18 16    Complications: No apparent anesthesia complications

## 2015-08-02 ENCOUNTER — Encounter (HOSPITAL_COMMUNITY): Payer: Self-pay | Admitting: Surgery

## 2015-08-02 NOTE — Discharge Summary (Signed)
Physician Discharge Summary [facility]  Patient ID: ELLWYN ERGLE MRN: [id number] DOB/AGE: [birth year] 45 y.o.  Admit date: 08/01/2015 Discharge date: 08/01/2015  Admission Diagnoses:  Chronic cholecystitis, cholelithiasis  Discharge Diagnoses:  Principal Problem:   Cholelithiasis with chronic cholecystitis   Discharged Condition: good  Hospital Course: patient observed in PACU for 4 hours post op.  Prepared for discharge home.  Consults: None  Treatments: surgery: lap chole with IOC  Discharge Exam: Blood pressure 131/77, pulse 95, temperature 98.2 F (36.8 C), temperature source Oral, resp. rate 18, height  (1.905 m), weight 123.52 kg (272 lb 5 oz), SpO2 98 %. See PACU noted.  Disposition: Home  Discharge Instructions    Diet - low sodium heart healthy    Complete by:  As directed      Discharge instructions    Complete by:  As directed   [facility]  LAPAROSCOPIC SURGERY:  POST-OP INSTRUCTIONS  Always review your discharge instruction sheet given to you by the facility where your surgery was performed.  A prescription for pain medication may be given to you upon discharge.  Take your pain medication as prescribed.  If narcotic pain medicine is not needed, then you may take acetaminophen (Tylenol) or ibuprofen (Advil) as needed.  Take your usually prescribed medications unless otherwise directed.  If you need a refill on your pain medication, please contact your pharmacy.  They will contact our office to request authorization. Prescriptions will not be filled after 5 P.M. or on weekends.  You should follow a light diet the first few days after arrival home, such as soup and crackers or toast.  Be sure to include plenty of fluids daily.  Most patients will experience some swelling and bruising in the area of the incisions.  Ice packs will help.  Swelling and bruising can take several days to resolve.   It is common to  experience some constipation after surgery.  Increasing fluid intake and taking a stool softener (such as Colace) will usually help or prevent this problem from occurring.  A mild laxative (Milk of Magnesia or Miralax) should be taken according to package instructions if there has been no bowel movement after 48 hours.  You will have steri-strips and a gauze dressing over your incisions.  You may remove the gauze bandage on the second day after surgery, and you may shower at that time.  Leave your steri-strips (small skin tapes) in place directly over the incision.  These strips should remain on the skin for 5-7 days and then be removed.  You may get them wet in the shower and pat them dry.  Any sutures or staples will be removed at the office during your follow-up visit.  ACTIVITIES:  You may resume regular (light) daily activities beginning the next day - such as daily self-care, walking, climbing stairs - gradually increasing activities as tolerated.  You may have sexual intercourse when it is comfortable.  Refrain from any heavy lifting or straining until approved by your doctor.  You may drive when you are no longer taking prescription pain medication, you can comfortably wear a seatbelt, and you can safely maneuver your car and apply brakes.  You should see your doctor in the office for a follow-up appointment approximately 2-3 weeks after your surgery.  Make sure that you call for this appointment within a day or two after you arrive home to insure a convenient appointment time.  WHEN TO CALL YOUR  DOCTOR: Fever over 101.0 Inability to urinate Continued bleeding from incision Increased pain, redness, or drainage from the incision Increasing abdominal pain  The clinic staff is available to answer your questions during regular business hours.  Please don't hesitate to call and ask to speak to one of the nurses for clinical concerns.  If you have a medical emergency, go to the nearest emergency  room or call 911.  A surgeon from [facility] is always on call for the hospital.  Velora Heckler, MD, [facility] Office: [phone] Toll Free:  [phone] FAX [phone]  Website: [email or web address]     Increase activity slowly    Complete by:  As directed      Remove dressing in 48 hours    Complete by:  As directed             Medication List    TAKE these medications        hydrochlorothiazide 25 MG tablet  Commonly known as:  HYDRODIURIL  Take 25 mg by mouth daily.     multivitamin with minerals Tabs tablet  Take 1 tablet by mouth daily.     omega-3 acid ethyl esters 1 g capsule  Commonly known as:  LOVAZA  Take 2 g by mouth daily.     oxyCODONE-acetaminophen 5-325 MG tablet  Commonly known as:  PERCOCET/ROXICET  Take 1-2 tablets by mouth every 6 (six) hours as needed for severe pain.     oxyCODONE-acetaminophen 5-325 MG tablet  Commonly known as:  ROXICET  Take 1-2 tablets by mouth every 4 (four) hours as needed for moderate pain or severe pain.           Follow-up Information    Follow up with Velora Heckler, MD. Schedule an appointment as soon as possible for a visit in 3 weeks.   Specialty:  General Surgery   Why:  For wound re-check   Contact information:   [address] [place] [address] [phone]       Velora Heckler, MD, [facility] Office: [phone]   Signed: Velora Heckler 08/02/2015, 7:11 AM

## 2016-03-08 HISTORY — PX: TOTAL HIP ARTHROPLASTY: SHX124

## 2018-05-05 ENCOUNTER — Other Ambulatory Visit: Payer: Self-pay

## 2018-05-05 ENCOUNTER — Emergency Department (HOSPITAL_BASED_OUTPATIENT_CLINIC_OR_DEPARTMENT_OTHER): Payer: BC Managed Care – PPO

## 2018-05-05 ENCOUNTER — Encounter (HOSPITAL_BASED_OUTPATIENT_CLINIC_OR_DEPARTMENT_OTHER): Payer: Self-pay | Admitting: Emergency Medicine

## 2018-05-05 ENCOUNTER — Observation Stay (HOSPITAL_BASED_OUTPATIENT_CLINIC_OR_DEPARTMENT_OTHER)
Admission: EM | Admit: 2018-05-05 | Discharge: 2018-05-06 | Disposition: A | Discharge status: Home / Self Care | Payer: BC Managed Care – PPO | Attending: Family Medicine | Admitting: Family Medicine

## 2018-05-05 DIAGNOSIS — Z87891 Personal history of nicotine dependence: Secondary | ICD-10-CM | POA: Diagnosis not present

## 2018-05-05 DIAGNOSIS — A4901 Methicillin susceptible Staphylococcus aureus infection, unspecified site: Secondary | ICD-10-CM | POA: Diagnosis not present

## 2018-05-05 DIAGNOSIS — Z79899 Other long term (current) drug therapy: Secondary | ICD-10-CM | POA: Diagnosis not present

## 2018-05-05 DIAGNOSIS — I10 Essential (primary) hypertension: Secondary | ICD-10-CM | POA: Diagnosis not present

## 2018-05-05 DIAGNOSIS — L089 Local infection of the skin and subcutaneous tissue, unspecified: Secondary | ICD-10-CM

## 2018-05-05 DIAGNOSIS — L0390 Cellulitis, unspecified: Secondary | ICD-10-CM

## 2018-05-05 DIAGNOSIS — L03012 Cellulitis of left finger: Secondary | ICD-10-CM | POA: Diagnosis not present

## 2018-05-05 LAB — CREATININE, SERUM
Creatinine, Ser: 1.12 mg/dL (ref 0.61–1.24)
GFR calc Af Amer: >60 mL/min (ref >60)

## 2018-05-05 LAB — BASIC METABOLIC PANEL
Anion gap: 9 (ref 5–15)
BUN: 13 mg/dL (ref 6–20)
CO2: 27 mmol/L (ref 22–32)
Calcium: 9.5 mg/dL (ref 8.9–10.3)
Chloride: 100 mmol/L (ref 98–111)
Creatinine, Ser: 0.94 mg/dL (ref 0.61–1.24)
GFR calc Af Amer: >60 mL/min (ref >60)
GFR calc non Af Amer: >60 mL/min (ref >60)
Glucose, Bld: 125 mg/dL — ABNORMAL HIGH (ref 70–99)
Potassium: 3.8 mmol/L (ref 3.5–5.1)
Sodium: 136 mmol/L (ref 135–145)

## 2018-05-05 LAB — CBC WITH DIFFERENTIAL/PLATELET
Abs Immature Granulocytes: 0.05 10*3/uL (ref 0.00–0.07)
BASOS ABS: 0 10*3/uL (ref 0.0–0.1)
Basophils Relative: 0 %
Eosinophils Absolute: 0 10*3/uL (ref 0.0–0.5)
Eosinophils Relative: 0 %
HCT: 53.3 % — ABNORMAL HIGH (ref 39.0–52.0)
Hemoglobin: 17.6 g/dL — ABNORMAL HIGH (ref 13.0–17.0)
Immature Granulocytes: 0 %
Lymphocytes Relative: 7 %
Lymphs Abs: 0.9 10*3/uL (ref 0.7–4.0)
MCH: 30.9 pg (ref 26.0–34.0)
MCHC: 33 g/dL (ref 30.0–36.0)
MCV: 93.5 fL (ref 80.0–100.0)
Monocytes Absolute: 0.4 10*3/uL (ref 0.1–1.0)
Monocytes Relative: 4 %
Neutro Abs: 11.1 10*3/uL — ABNORMAL HIGH (ref 1.7–7.7)
Neutrophils Relative %: 89 %
PLATELETS: 273 10*3/uL (ref 150–400)
RBC: 5.7 MIL/uL (ref 4.22–5.81)
RDW: 12.4 % (ref 11.5–15.5)
WBC: 12.5 10*3/uL — ABNORMAL HIGH (ref 4.0–10.5)
nRBC: 0 % (ref 0.0–0.2)

## 2018-05-05 LAB — HEMOGLOBIN A1C
Hgb A1c MFr Bld: 4.8 % (ref 4.8–5.6)
Mean Plasma Glucose: 91.06 mg/dL

## 2018-05-05 LAB — CBC
HCT: 51.6 % (ref 39.0–52.0)
HEMOGLOBIN: 17.7 g/dL — AB (ref 13.0–17.0)
MCH: 31.4 pg (ref 26.0–34.0)
MCHC: 34.3 g/dL (ref 30.0–36.0)
MCV: 91.7 fL (ref 80.0–100.0)
Platelets: 286 10*3/uL (ref 150–400)
RBC: 5.63 MIL/uL (ref 4.22–5.81)
RDW: 12.3 % (ref 11.5–15.5)
WBC: 12.1 10*3/uL — ABNORMAL HIGH (ref 4.0–10.5)
nRBC: 0 % (ref 0.0–0.2)

## 2018-05-05 LAB — MRSA PCR SCREENING: MRSA by PCR: NEGATIVE

## 2018-05-05 MED ORDER — LIDOCAINE HCL 2 % IJ SOLN
5.0000 mL | Freq: Once | INTRAMUSCULAR | Status: AC
  Administered 2018-05-05: 100 mg
  Filled 2018-05-05: qty 20

## 2018-05-05 MED ORDER — VANCOMYCIN HCL 10 G IV SOLR
1500.0000 mg | INTRAVENOUS | Status: AC
  Administered 2018-05-06: 1500 mg via INTRAVENOUS
  Filled 2018-05-05: qty 1500

## 2018-05-05 MED ORDER — MORPHINE SULFATE (PF) 2 MG/ML IV SOLN
1.0000 mg | INTRAVENOUS | Status: DC | PRN
  Administered 2018-05-05 – 2018-05-06 (×2): 1 mg via INTRAVENOUS
  Filled 2018-05-05 (×3): qty 1

## 2018-05-05 MED ORDER — CLINDAMYCIN PHOSPHATE 600 MG/50ML IV SOLN
600.0000 mg | Freq: Once | INTRAVENOUS | Status: AC
  Administered 2018-05-05: 600 mg via INTRAVENOUS
  Filled 2018-05-05: qty 50

## 2018-05-05 MED ORDER — ACETAMINOPHEN 325 MG PO TABS
650.0000 mg | ORAL_TABLET | Freq: Four times a day (QID) | ORAL | Status: DC | PRN
  Administered 2018-05-05: 650 mg via ORAL
  Filled 2018-05-05: qty 2

## 2018-05-05 MED ORDER — ENOXAPARIN SODIUM 40 MG/0.4ML ~~LOC~~ SOLN
40.0000 mg | SUBCUTANEOUS | Status: DC
  Filled 2018-05-05: qty 0.4

## 2018-05-05 MED ORDER — OXYCODONE-ACETAMINOPHEN 5-325 MG PO TABS
1.0000 | ORAL_TABLET | Freq: Four times a day (QID) | ORAL | Status: DC | PRN
  Administered 2018-05-05: 2 via ORAL
  Filled 2018-05-05 (×2): qty 2

## 2018-05-05 MED ORDER — SODIUM CHLORIDE 0.9 % IV SOLN
INTRAVENOUS | Status: DC
  Administered 2018-05-05 – 2018-05-06 (×2): via INTRAVENOUS

## 2018-05-05 MED ORDER — SODIUM CHLORIDE 0.9 % IV SOLN
INTRAVENOUS | Status: DC | PRN
  Administered 2018-05-05: 250 mL via INTRAVENOUS

## 2018-05-05 MED ORDER — ONDANSETRON HCL 4 MG/2ML IJ SOLN: 4.0000 mg | Freq: Four times a day (QID) | INTRAMUSCULAR | Status: DC | PRN

## 2018-05-05 MED ORDER — ACETAMINOPHEN 650 MG RE SUPP: 650.0000 mg | Freq: Four times a day (QID) | RECTAL | Status: DC | PRN

## 2018-05-05 MED ORDER — LISINOPRIL 10 MG PO TABS
10.0000 mg | ORAL_TABLET | Freq: Every day | ORAL | Status: DC
  Administered 2018-05-06: 10 mg via ORAL
  Filled 2018-05-05: qty 1

## 2018-05-05 MED ORDER — FENTANYL CITRATE (PF) 100 MCG/2ML IJ SOLN
50.0000 ug | Freq: Once | INTRAMUSCULAR | Status: AC
  Administered 2018-05-05: 50 ug via INTRAVENOUS
  Filled 2018-05-05: qty 2

## 2018-05-05 MED ORDER — OMEGA-3-ACID ETHYL ESTERS 1 G PO CAPS: 2.0000 g | ORAL_CAPSULE | Freq: Every day | ORAL | Status: DC

## 2018-05-05 MED ORDER — CHLORHEXIDINE GLUCONATE 4 % EX LIQD
60.0000 mL | Freq: Once | CUTANEOUS | Status: DC
  Filled 2018-05-05: qty 60

## 2018-05-05 MED ORDER — ONDANSETRON HCL 4 MG PO TABS: 4.0000 mg | ORAL_TABLET | Freq: Four times a day (QID) | ORAL | Status: DC | PRN

## 2018-05-05 MED ORDER — DEXTROSE 5 % IV SOLN
3.0000 g | INTRAVENOUS | Status: AC
  Administered 2018-05-06: 3 g via INTRAVENOUS
  Filled 2018-05-05: qty 3

## 2018-05-05 MED ORDER — ALBUTEROL SULFATE (2.5 MG/3ML) 0.083% IN NEBU: 2.5000 mg | INHALATION_SOLUTION | RESPIRATORY_TRACT | Status: DC | PRN

## 2018-05-05 MED ORDER — ADULT MULTIVITAMIN W/MINERALS CH: 1.0000 | ORAL_TABLET | Freq: Every day | ORAL | Status: DC

## 2018-05-05 MED ORDER — POVIDONE-IODINE 10 % EX SWAB: 2.0000 "application " | Freq: Once | CUTANEOUS | Status: DC

## 2018-05-05 MED ORDER — HYDROCHLOROTHIAZIDE 25 MG PO TABS
25.0000 mg | ORAL_TABLET | Freq: Every day | ORAL | Status: DC
  Administered 2018-05-06: 25 mg via ORAL
  Filled 2018-05-05: qty 1

## 2018-05-05 NOTE — ED Provider Notes (Signed)
[facility] EMERGENCY DEPARTMENT Provider Note   CSN: [id number] Arrival date & time: 05/05/18  0900     History   Chief Complaint Chief Complaint  Patient presents with  . Hand Pain    HPI Douglas Whitney is a 47 y.o. male.  HPI Patient with infected left pinky finger.  Began around 4 5 days ago.  Had reportedly been getting a shot of penicillin from his PCP some steroids and has been started on Bactrim and doxycycline.  Has had 2 days of antibiotics.  Started having fevers and chills last night.  Redness and pain is gotten worse.  States that he had a piece of metal go up the base of the nail several months ago and since then he is been getting infections.  States he normally just pushes on it or use the nail open up and infection or gel will come out.  Now is feeling worse.  Pain with bending it and decreased movement of the finger. Past Medical History:  Diagnosis Date  . Acid reflux   . Hypertension   . Seasonal allergies   . Sinusitis   . Snores   . Wears glasses     Patient Active Problem List   Diagnosis Date Noted  . Cholelithiasis with chronic cholecystitis 07/28/2015  . Deviated septum 04/14/2013    Past Surgical History:  Procedure Laterality Date  . CHOLECYSTECTOMY N/A 08/01/2015   Procedure: LAPAROSCOPIC CHOLECYSTECTOMY WITH INTRAOPERATIVE CHOLANGIOGRAM;  Surgeon: Darnell Level, MD;  Location: [facility] OR;  Service: General;  Laterality: N/A;  . ESOPHAGOGASTRODUODENOSCOPY    . FINGER AMPUTATION  1983   cut off tip lt index finger  . NASAL SEPTOPLASTY W/ TURBINOPLASTY Bilateral 04/14/2013   Procedure: NASAL SEPTOPLASTY BILATERAL INFERIOR TURBINATE REDUCTION ;  Surgeon: Osborn Coho, MD;  Location: [facility];  Service: ENT;  Laterality: Bilateral;  . ORIF FINGER FRACTURE  1984   lt middle  . WISDOM TOOTH EXTRACTION          Home Medications    Prior to Admission medications   Medication Sig Start Date End Date Taking?  Authorizing Provider  hydrochlorothiazide (HYDRODIURIL) 25 MG tablet Take 25 mg by mouth daily.    [provider]  Multiple Vitamin (MULTIVITAMIN WITH MINERALS) TABS tablet Take 1 tablet by mouth daily.    [provider]  omega-3 acid ethyl esters (LOVAZA) 1 G capsule Take 2 g by mouth daily.    [provider]  oxyCODONE-acetaminophen (PERCOCET/ROXICET) 5-325 MG tablet Take 1-2 tablets by mouth every 6 (six) hours as needed for severe pain. 07/15/15   Horton, Mayer Masker, MD  oxyCODONE-acetaminophen (ROXICET) 5-325 MG tablet Take 1-2 tablets by mouth every 4 (four) hours as needed for moderate pain or severe pain. 08/01/15   Darnell Level, MD    Family History History reviewed. No pertinent family history.  Social History Social History   Tobacco Use  . Smoking status: Former Smoker    Last attempt to quit: 04/10/1993    Years since quitting: 25.0  Substance Use Topics  . Alcohol use: Yes    Comment: rare  . Drug use: No     Allergies   Patient has no known allergies.   Review of Systems Review of Systems  Constitutional: Positive for appetite change and fever.  HENT: Negative for congestion.   Respiratory: Negative for shortness of breath.   Cardiovascular: Negative for chest pain.  Gastrointestinal: Negative for abdominal distention.  Musculoskeletal:  Left ring finger pain swelling and erythema.  Skin: Positive for color change.  Neurological: Negative for weakness and numbness.  Psychiatric/Behavioral: Negative for confusion.     Physical Exam Updated Vital Signs BP 121/78 (BP Location: Right Arm)   Pulse 84   Temp 98.8 F (37.1 C) (Oral)   Resp 18   Ht 6\' 3"  (1.905 m)   Wt 120.2 kg   SpO2 98%   BMI 33.12 kg/m   Physical Exam  Constitutional: He appears well-developed.  HENT:  Head: Normocephalic.  Eyes: Pupils are equal, round, and reactive to light.  Neck: Neck supple.  Cardiovascular: Normal rate.  Pulmonary/Chest: He  has no rales.  Musculoskeletal: He exhibits tenderness.  Left fifth finger with erythema some ecchymosis and swelling.  Swollen from the distal phalanx up to the proximal phalanx.  Decreased range of motion.  Neurological: He is alert.  Skin: Skin is warm. Capillary refill takes less than 2 seconds.     ED Treatments / Results  Labs (all labs ordered are listed, but only abnormal results are displayed) Labs Reviewed  CBC WITH DIFFERENTIAL/PLATELET - Abnormal; Notable for the following components:      Result Value   WBC 12.5 (*)    Hemoglobin 17.6 (*)    HCT 53.3 (*)    Neutro Abs 11.1 (*)    All other components within normal limits  BASIC METABOLIC PANEL - Abnormal; Notable for the following components:   Glucose, Bld 125 (*)    All other components within normal limits    EKG None  Radiology Dg Finger Little Left  Result Date: 05/05/2018 CLINICAL DATA:  Erythema to LEFT fifth finger since Monday, swelling. EXAM: LEFT LITTLE FINGER 2+V COMPARISON:  None. FINDINGS: Osseous alignment is normal. Bone mineralization is normal. No acute or suspicious osseous lesion. No fracture line or displaced fracture fragment. No destructive changes to suggest osteomyelitis. No soft tissue gas or foreign body. IMPRESSION: Negative. Electronically Signed   By: Bary Richard M.D.   On: 05/05/2018 10:11    Procedures .Marland KitchenIncision and Drainage Date/Time: 05/05/2018 3:22 PM Performed by: Benjiman Core, MD Authorized by: Benjiman Core, MD   Consent:    Consent obtained:  Verbal   Consent given by:  Patient   Risks discussed:  Bleeding, incomplete drainage, pain and infection   Alternatives discussed:  No treatment and delayed treatment Location:    Type:  Abscess   Location:  Upper extremity   Upper extremity location:  Finger   Finger location:  L small finger Pre-procedure details:    Skin preparation:  Betadine Anesthesia (see MAR for exact dosages):    Anesthesia method:  Digital block. Procedure type:    Complexity:  Simple Procedure details:    Needle aspiration: no     Incision types:  Stab incision   Wound management:  Probed and deloculated   Drainage:  Purulent   Drainage amount:  Scant   Wound treatment:  Wound left open   Packing materials:  None Post-procedure details:    Patient tolerance of procedure:  Tolerated well, no immediate complications Comments:     Wound culture sent.   (including critical care time)  Medications Ordered in ED Medications  clindamycin (CLEOCIN) IVPB 600 mg (has no administration in time range)  lidocaine (XYLOCAINE) 2 % (with pres) injection 100 mg (100 mg Other Given 05/05/18 1037)  fentaNYL (SUBLIMAZE) injection 50 mcg (50 mcg Intravenous Given 05/05/18 1128)     Initial Impression / Assessment  and Plan / ED Course  I have reviewed the triage vital signs and the nursing notes.  Pertinent labs & imaging results that were available during my care of the patient were reviewed by me and considered in my medical decision making (see chart for details).     Patient with finger infection.  Minimal improvement with attempted drainage in ER.  Discussed with Dr. Landau from hand surgery.  Will see in conDion Sauciersult but will admit to internal medicine.  [facility]  Patient request to go by private vehicle.  Final Clinical Impressions(s) / ED Diagnoses   Final diagnoses:  Finger infection    ED Discharge Orders    None       Benjiman CorePickering, Tamme Mozingo, MD 05/05/18 1523

## 2018-05-05 NOTE — H&P (Addendum)
HISTORY AND PHYSICAL       PATIENT DETAILS Name: Douglas Whitney Age: 47 y.o. Sex: male Date of Birth: [birth year] Admit Date: 05/05/2018 ZOX:WRUEAV, Fayrene Fearing, MD   Patient coming from: [facility]  CHIEF COMPLAINT:  Left fifth finger swelling and pain x3 days  HPI: Douglas Whitney is a 47 y.o. male with medical history significant of hypertension-presenting with the above-noted complaints.  For the past 3 days or so-he has noted that his left fifth finger was much more swollen and tender.  He went to his primary care practitioner-was given a shot of penicillin, subsequently placed on doxycycline and Bactrim along with prednisone.  In spite of these measures patient's left fifth finger continued to worsen-and as result presented to med center [facility] for evaluation.  He was found to have leukocytosis, he was subsequently transferred to the inpatient service of trial hospitalist for further evaluation and treatment.  Patient does acknowledge subjective fever.  Denies any headache, chest pain, shortness of breath, nausea vomiting or diarrhea.  Note: Lives at: Home Mobility: Independent Chronic Indwelling Foley:no   REVIEW OF SYSTEMS:  Constitutional:   No  weight loss, night sweats,  Fevers, chills, fatigue.  HEENT:    No headaches, Dysphagia,Tooth/dental problems,Sore throat,  No sneezing, itching, ear ache, nasal congestion, post nasal drip  Cardio-vascular: No chest pain,Orthopnea, PND,lower extremity edema, anasarca, palpitations  GI:  No heartburn, indigestion, abdominal pain, nausea, vomiting, diarrhea, melena or hematochezia  Resp: No shortness of breath, cough, hemoptysis,plueritic chest pain.   Skin:  No rash or lesions.  GU:  No dysuria, change in color of urine, no urgency or frequency.  No flank pain.  Musculoskeletal: No joint pain or swelling.  No decreased range of motion.  No back pain.  Endocrine: No heat intolerance, no cold intolerance, no  polyuria, no polydipsia  Psych: No change in mood or affect. No depression or anxiety.  No memory loss.  ALLERGIES:  No Known Allergies  PAST MEDICAL HISTORY: Past Medical History:  Diagnosis Date  . Acid reflux   . Hypertension   . Seasonal allergies   . Sinusitis   . Snores   . Wears glasses     PAST SURGICAL HISTORY: Past Surgical History:  Procedure Laterality Date  . CHOLECYSTECTOMY N/A 08/01/2015   Procedure: LAPAROSCOPIC CHOLECYSTECTOMY WITH INTRAOPERATIVE CHOLANGIOGRAM;  Surgeon: Darnell Level, MD;  Location: [facility] OR;  Service: General;  Laterality: N/A;  . ESOPHAGOGASTRODUODENOSCOPY    . FINGER AMPUTATION  1983   cut off tip lt index finger  . NASAL SEPTOPLASTY W/ TURBINOPLASTY Bilateral 04/14/2013   Procedure: NASAL SEPTOPLASTY BILATERAL INFERIOR TURBINATE REDUCTION ;  Surgeon: Osborn Coho, MD;  Location: [facility];  Service: ENT;  Laterality: Bilateral;  . ORIF FINGER FRACTURE  1984   lt middle  . WISDOM TOOTH EXTRACTION      MEDICATIONS AT HOME: Prior to Admission medications   Medication Sig Start Date End Date Taking? Authorizing Provider  hydrochlorothiazide (HYDRODIURIL) 25 MG tablet Take 25 mg by mouth daily.    [provider]  Multiple Vitamin (MULTIVITAMIN WITH MINERALS) TABS tablet Take 1 tablet by mouth daily.    [provider]  omega-3 acid ethyl esters (LOVAZA) 1 G capsule Take 2 g by mouth daily.    [provider]  oxyCODONE-acetaminophen (PERCOCET/ROXICET) 5-325 MG tablet Take 1-2 tablets by mouth every 6 (six) hours as needed for severe pain. 07/15/15   Horton, Mayer Masker,  MD  oxyCODONE-acetaminophen (ROXICET) 5-325 MG tablet Take 1-2 tablets by mouth every 4 (four) hours as needed for moderate pain or severe pain. 08/01/15   Darnell Level, MD    FAMILY HISTORY: Father: CVA  SOCIAL HISTORY:  reports that he quit smoking about 25 years ago. He does not have any smokeless tobacco history on file. He  reports that he drinks alcohol. He reports that he does not use drugs.  PHYSICAL EXAM: Blood pressure 127/78, pulse 75, temperature 98.3 F (36.8 C), temperature source Oral, resp. rate 18, height 6\' 3"  (1.905 m), weight 120.2 kg, SpO2 98 %.  General appearance :Awake, alert, not in any distress. Speech Clear. Not toxic Looking Eyes:, pupils equally reactive to light and accomodation,no scleral icterus.Pink conjunctiva HEENT: Atraumatic and Normocephalic Neck: supple, no JVD. No cervical lymphadenopathy. No thyromegaly Resp:Good air entry bilaterally, no added sounds  CVS: S1 S2 regular, no murmurs.  GI: Bowel sounds present, Non tender and not distended with no gaurding, rigidity or rebound.No organomegaly Extremities: B/L Lower Ext shows no edema, both legs are warm to touch.  Left fifth finger swollen-erythematous and tender.  Swelling/erythema has not extended to the dorsum of the hand/palm or the wrist. Neurology:  speech clear,Non focal, sensation is grossly intact. Psychiatric: Normal judgment and insight. Alert and oriented x 3. Normal mood. Musculoskeletal:gait appears to be normal.No digital cyanosis Skin:No Rash, warm and dry Wounds:N/A  LABS ON ADMISSION:  I have personally reviewed following labs and imaging studies  CBC: Recent Labs  Lab 05/05/18 1038  WBC 12.5*  NEUTROABS 11.1*  HGB 17.6*  HCT 53.3*  MCV 93.5  PLT 273    Basic Metabolic Panel: Recent Labs  Lab 05/05/18 1038  NA 136  K 3.8  CL 100  CO2 27  GLUCOSE 125*  BUN 13  CREATININE 0.94  CALCIUM 9.5    GFR: Estimated Creatinine Clearance: 132.8 mL/min (by C-G formula based on SCr of 0.94 mg/dL).  Liver Function Tests: No results for input(s): AST, ALT, ALKPHOS, BILITOT, PROT, ALBUMIN in the last 168 hours. No results for input(s): LIPASE, AMYLASE in the last 168 hours. No results for input(s): AMMONIA in the last 168 hours.  Coagulation Profile: No results for input(s): INR, PROTIME in  the last 168 hours.  Cardiac Enzymes: No results for input(s): CKTOTAL, CKMB, CKMBINDEX, TROPONINI in the last 168 hours.  BNP (last 3 results) No results for input(s): PROBNP in the last 8760 hours.  HbA1C: No results for input(s): HGBA1C in the last 72 hours.  CBG: No results for input(s): GLUCAP in the last 168 hours.  Lipid Profile: No results for input(s): CHOL, HDL, LDLCALC, TRIG, CHOLHDL, LDLDIRECT in the last 72 hours.  Thyroid Function Tests: No results for input(s): TSH, T4TOTAL, FREET4, T3FREE, THYROIDAB in the last 72 hours.  Anemia Panel: No results for input(s): VITAMINB12, FOLATE, FERRITIN, TIBC, IRON, RETICCTPCT in the last 72 hours.  Urine analysis: No results found for: COLORURINE, APPEARANCEUR, LABSPEC, PHURINE, GLUCOSEU, HGBUR, BILIRUBINUR, KETONESUR, PROTEINUR, UROBILINOGEN, NITRITE, LEUKOCYTESUR  Sepsis Labs: Lactic Acid, Venous No results found for: LATICACIDVEN   Microbiology: No results found for this or any previous visit (from the past 240 hour(s)).    RADIOLOGIC STUDIES ON ADMISSION: Dg Finger Little Left  Result Date: 05/05/2018 CLINICAL DATA:  Erythema to LEFT fifth finger since Monday, swelling. EXAM: LEFT LITTLE FINGER 2+V COMPARISON:  None. FINDINGS: Osseous alignment is normal. Bone mineralization is normal. No acute or suspicious osseous lesion. No fracture line or  displaced fracture fragment. No destructive changes to suggest osteomyelitis. No soft tissue gas or foreign body. IMPRESSION: Negative. Electronically Signed   By: Bary RichardStan  Maynard M.D.   On: 05/05/2018 10:11    EKG:  Not performed  ASSESSMENT AND PLAN: Left fifth finger soft tissue infection: Likely paronychia-patient does not appear acutely ill-but does have leukocytosis-and has failed outpatient antimicrobial therapy-hence starting IV vancomycin-orthopedics following-plans are to monitor for clinical improvement with IV antibiotics-if no improvement-plans are for surgical  debridement tomorrow-will keep NPO post midnight.  Hypertension: Resume HCTZ/lisinopril-patient also is on metoprolol which she does not take regularly-we will follow and resume metoprolol accordingly.  Further plan will depend as patient's clinical course evolves and further radiologic and laboratory data become available. Patient will be monitored closely.  Above noted plan was discussed with patient/family face to face at bedside, they were in agreement.   CONSULTS: Ortho  DVT Prophylaxis: Prophylactic Lovenox   Code Status: Full Code  Disposition Plan:  Discharge back home possibly in 2-3 days, depending on clinical course  Admission status: Inpatient  going to medical floor    Total time spent  55 minutes.Greater than 50% of this time was spent in counseling, explanation of diagnosis, planning of further management, and coordination of care.  Severity of Illness:  The appropriate patient status for this patient is INPATIENT. Inpatient status is judged to be reasonable and necessary in order to provide the required intensity of service to ensure the patient's safety. The patient's presenting symptoms, physical exam findings, and initial radiographic and laboratory data in the context of their chronic comorbidities is felt to place them at high risk for further clinical deterioration. Furthermore, it is not anticipated that the patient will be medically stable for discharge from the hospital within 2 midnights of admission. The following factors support the patient status of inpatient.   " The patient's presenting symptoms include worsening left fifth finger swelling/erythema in spite of outpatient oral antibiotic therapy " The worrisome physical exam findings include severe tenderness, swelling and erythema of her left fifth finger. " The initial radiographic and laboratory data are worrisome because of leukocytosis " The chronic co-morbidities include hypertension   * I certify  that at the point of admission it is my clinical judgment that the patient will require inpatient hospital care spanning beyond 2 midnights from the point of admission due to high intensity of service, high risk for further deterioration and high frequency of surveillance required.**  Daniyal Tabor [facility] Pager [phone]  If 7PM-7AM, please contact night-coverage [email or web address] Password [facility] [email or web address] 05/05/2018, 2:46 PM

## 2018-05-05 NOTE — ED Triage Notes (Signed)
Reports erythema to left pinky finger on Monday.  States seen on Tuesday and given shot of penicillin, RX for steroids and abx.  Reports no relief in pain, erythema persists, febrile last night.

## 2018-05-05 NOTE — Consult Note (Signed)
ORTHOPAEDIC CONSULTATION  REQUESTING PHYSICIAN: Maretta BeesGhimire, Shanker M, MD  Chief Complaint: Left small finger infection  HPI: Douglas Whitney is a 47 y.o. male who complains of acute on chronic increasing swelling, pain, redness, left small finger.  He had some type of a injury to it about a year ago, with direct contact to a piece of metal, and then had off-and-on progressive swelling, redness, drainage, difficulty moving it.  Over the last couple of days to a week is gotten more significantly worse.  Pain in both the PIP joint as well as the DIP joint, with significant swelling.  Past Medical History:  Diagnosis Date  . Acid reflux   . Hypertension   . Seasonal allergies   . Sinusitis   . Snores   . Wears glasses    Past Surgical History:  Procedure Laterality Date  . CHOLECYSTECTOMY N/A 08/01/2015   Procedure: LAPAROSCOPIC CHOLECYSTECTOMY WITH INTRAOPERATIVE CHOLANGIOGRAM;  Surgeon: Darnell Levelodd Gerkin, MD;  [facility];  Service: General;  Laterality: N/A;  . ESOPHAGOGASTRODUODENOSCOPY    . FINGER AMPUTATION  1983   cut off tip lt index finger  . NASAL SEPTOPLASTY W/ TURBINOPLASTY Bilateral 04/14/2013   Procedure: NASAL SEPTOPLASTY BILATERAL INFERIOR TURBINATE REDUCTION ;  Surgeon: Osborn Cohoavid Shoemaker, MD;  Location: [facility];  Service: ENT;  Laterality: Bilateral;  . ORIF FINGER FRACTURE  1984   lt middle  . WISDOM TOOTH EXTRACTION     Social History   Socioeconomic History  . Marital status: Married    Spouse name: Not on file  . Number of children: Not on file  . Years of education: Not on file  . Highest education level: Not on file  Occupational History  . Not on file  Social Needs  . Financial resource strain: Not on file  . Food insecurity:    Worry: Not on file    Inability: Not on file  . Transportation needs:    Medical: Not on file    Non-medical: Not on file  Tobacco Use  . Smoking status: Former Smoker    Last attempt to quit: 04/10/1993   Years since quitting: 25.0  Substance and Sexual Activity  . Alcohol use: Yes    Comment: rare  . Drug use: No  . Sexual activity: Not on file  Lifestyle  . Physical activity:    Days per week: Not on file    Minutes per session: Not on file  . Stress: Not on file  Relationships  . Social connections:    Talks on phone: Not on file    Gets together: Not on file    Attends religious service: Not on file    Active member of club or organization: Not on file    Attends meetings of clubs or organizations: Not on file    Relationship status: Not on file  Other Topics Concern  . Not on file  Social History Narrative  . Not on file   History reviewed. No pertinent family history. No Known Allergies   Positive ROS: All other systems have been reviewed and were otherwise negative with the exception of those mentioned in the HPI and as above.  Physical Exam: General: Alert, no acute distress Cardiovascular: No pedal edema Respiratory: No cyanosis, no use of accessory musculature GI: No organomegaly, abdomen is soft and non-tender Skin: Positive cellulitis over the left small finger Neurologic: Sensation intact distally Psychiatric: Patient is competent for consent with normal mood and affect Lymphatic: No axillary or  cervical lymphadenopathy  MUSCULOSKELETAL: He has no pain in the palm, no pain with passive extension, he does have significant diffuse soft tissue swelling that appears to be extending from the level of the nailbed moving proximally, primarily on the dorsal side.  His DIP flexion is intact although limited secondary to swelling.  Assessment: Acute on chronic left paronychia, small finger   Plan: This is a somewhat chronic, severe problem at this point.  I think he is probably going to need surgical irrigation and debridement.  He is going to start on IV antibiotics today, n.p.o. after midnight, if not significantly improving plan for surgical I&D tomorrow.  The  risks benefits and alternatives and postoperative recovery course of been outlined in detail including the potential for involvement of the interphalangeal joints, the bone, the need for repeat irrigation and debridement, loss of the nail, among others.    Eulas Post, MD Cell [phone]   05/05/2018 2:48 PM

## 2018-05-06 ENCOUNTER — Observation Stay (HOSPITAL_COMMUNITY): Payer: BC Managed Care – PPO | Admitting: Anesthesiology

## 2018-05-06 ENCOUNTER — Encounter (HOSPITAL_COMMUNITY): Payer: Self-pay | Admitting: Anesthesiology

## 2018-05-06 ENCOUNTER — Encounter (HOSPITAL_COMMUNITY)
Admission: EM | Disposition: A | Discharge status: Home / Self Care | Payer: Self-pay | Source: Home / Self Care | Attending: Emergency Medicine

## 2018-05-06 DIAGNOSIS — L03012 Cellulitis of left finger: Secondary | ICD-10-CM | POA: Diagnosis not present

## 2018-05-06 HISTORY — PX: I & D EXTREMITY: SHX5045

## 2018-05-06 LAB — BASIC METABOLIC PANEL
Anion gap: 9 (ref 5–15)
BUN: 12 mg/dL (ref 6–20)
CO2: 26 mmol/L (ref 22–32)
Calcium: 8.7 mg/dL — ABNORMAL LOW (ref 8.9–10.3)
Chloride: 102 mmol/L (ref 98–111)
Creatinine, Ser: 0.91 mg/dL (ref 0.61–1.24)
GFR calc non Af Amer: >60 mL/min (ref >60)
Glucose, Bld: 121 mg/dL — ABNORMAL HIGH (ref 70–99)
Potassium: 3.5 mmol/L (ref 3.5–5.1)
SODIUM: 137 mmol/L (ref 135–145)

## 2018-05-06 LAB — CBC
HCT: 48.9 % (ref 39.0–52.0)
Hemoglobin: 15.9 g/dL (ref 13.0–17.0)
MCH: 30.6 pg (ref 26.0–34.0)
MCHC: 32.5 g/dL (ref 30.0–36.0)
MCV: 94 fL (ref 80.0–100.0)
NRBC: 0 % (ref 0.0–0.2)
Platelets: 272 10*3/uL (ref 150–400)
RBC: 5.2 MIL/uL (ref 4.22–5.81)
RDW: 12.3 % (ref 11.5–15.5)
WBC: 11 10*3/uL — ABNORMAL HIGH (ref 4.0–10.5)

## 2018-05-06 LAB — HIV ANTIBODY (ROUTINE TESTING W REFLEX): HIV Screen 4th Generation wRfx: NONREACTIVE

## 2018-05-06 SURGERY — IRRIGATION AND DEBRIDEMENT EXTREMITY
Anesthesia: General | Site: Finger | Laterality: Left

## 2018-05-06 MED ORDER — DEXAMETHASONE SODIUM PHOSPHATE 10 MG/ML IJ SOLN
INTRAMUSCULAR | Status: AC
  Filled 2018-05-06: qty 1

## 2018-05-06 MED ORDER — BISACODYL 10 MG RE SUPP: 10.0000 mg | Freq: Every day | RECTAL | Status: DC | PRN

## 2018-05-06 MED ORDER — VITAMIN C 500 MG PO TABS
1000.0000 mg | ORAL_TABLET | Freq: Every day | ORAL | Status: DC
  Administered 2018-05-06: 1000 mg via ORAL
  Filled 2018-05-06: qty 2

## 2018-05-06 MED ORDER — FENTANYL CITRATE (PF) 250 MCG/5ML IJ SOLN
INTRAMUSCULAR | Status: AC
  Filled 2018-05-06: qty 5

## 2018-05-06 MED ORDER — PROPOFOL 10 MG/ML IV BOLUS
INTRAVENOUS | Status: DC | PRN
  Administered 2018-05-06: 300 mg via INTRAVENOUS

## 2018-05-06 MED ORDER — OXYCODONE HCL 5 MG PO TABS: 5.0000 mg | ORAL_TABLET | ORAL | 0 refills | Status: DC | PRN

## 2018-05-06 MED ORDER — SENNA-DOCUSATE SODIUM 8.6-50 MG PO TABS: 2.0000 | ORAL_TABLET | Freq: Every day | ORAL | 1 refills | Status: DC

## 2018-05-06 MED ORDER — SODIUM CHLORIDE 0.9 % IV SOLN
3.0000 g | Freq: Three times a day (TID) | INTRAVENOUS | Status: DC
  Administered 2018-05-06: 3 g via INTRAVENOUS
  Filled 2018-05-06 (×2): qty 3

## 2018-05-06 MED ORDER — DIPHENHYDRAMINE HCL 25 MG PO CAPS: 25.0000 mg | ORAL_CAPSULE | Freq: Four times a day (QID) | ORAL | Status: DC | PRN

## 2018-05-06 MED ORDER — ALPRAZOLAM 0.5 MG PO TABS: 0.5000 mg | ORAL_TABLET | Freq: Four times a day (QID) | ORAL | Status: DC | PRN

## 2018-05-06 MED ORDER — POLYETHYLENE GLYCOL 3350 17 G PO PACK: 17.0000 g | PACK | Freq: Every day | ORAL | Status: DC | PRN

## 2018-05-06 MED ORDER — VANCOMYCIN HCL IN DEXTROSE 1-5 GM/200ML-% IV SOLN
1000.0000 mg | Freq: Three times a day (TID) | INTRAVENOUS | Status: DC
  Administered 2018-05-06: 1000 mg via INTRAVENOUS
  Filled 2018-05-06 (×2): qty 200

## 2018-05-06 MED ORDER — OXYCODONE HCL 5 MG PO TABS
5.0000 mg | ORAL_TABLET | ORAL | Status: DC | PRN
  Administered 2018-05-06: 10 mg via ORAL
  Filled 2018-05-06: qty 2

## 2018-05-06 MED ORDER — HYDROMORPHONE HCL 1 MG/ML IJ SOLN: 0.5000 mg | INTRAMUSCULAR | Status: DC | PRN

## 2018-05-06 MED ORDER — PROPOFOL 10 MG/ML IV BOLUS
INTRAVENOUS | Status: AC
  Filled 2018-05-06: qty 20

## 2018-05-06 MED ORDER — MIDAZOLAM HCL 2 MG/2ML IJ SOLN
INTRAMUSCULAR | Status: AC
  Filled 2018-05-06: qty 2

## 2018-05-06 MED ORDER — LACTATED RINGERS IV SOLN
INTRAVENOUS | Status: DC
  Administered 2018-05-06: 09:00:00 via INTRAVENOUS

## 2018-05-06 MED ORDER — AMOXICILLIN-POT CLAVULANATE 875-125 MG PO TABS: 1.0000 | ORAL_TABLET | Freq: Two times a day (BID) | ORAL | 0 refills | Status: AC

## 2018-05-06 MED ORDER — FAMOTIDINE 20 MG PO TABS: 20.0000 mg | ORAL_TABLET | Freq: Two times a day (BID) | ORAL | Status: DC | PRN

## 2018-05-06 MED ORDER — 0.9 % SODIUM CHLORIDE (POUR BTL) OPTIME
TOPICAL | Status: DC | PRN
  Administered 2018-05-06: 1000 mL

## 2018-05-06 MED ORDER — SULFAMETHOXAZOLE-TRIMETHOPRIM 800-160 MG PO TABS: 1.0000 | ORAL_TABLET | Freq: Two times a day (BID) | ORAL | 0 refills | Status: DC

## 2018-05-06 MED ORDER — LIDOCAINE 2% (20 MG/ML) 5 ML SYRINGE
INTRAMUSCULAR | Status: AC
  Filled 2018-05-06: qty 5

## 2018-05-06 MED ORDER — MORPHINE SULFATE (PF) 2 MG/ML IV SOLN: 1.0000 mg | INTRAVENOUS | Status: DC | PRN

## 2018-05-06 MED ORDER — ONDANSETRON HCL 4 MG/2ML IJ SOLN
INTRAMUSCULAR | Status: DC | PRN
  Administered 2018-05-06: 4 mg via INTRAVENOUS

## 2018-05-06 MED ORDER — SENNA 8.6 MG PO TABS: 1.0000 | ORAL_TABLET | Freq: Two times a day (BID) | ORAL | Status: DC

## 2018-05-06 MED ORDER — SODIUM CHLORIDE 0.9 % IR SOLN
Status: DC | PRN
  Administered 2018-05-06: 3000 mL

## 2018-05-06 MED ORDER — ONDANSETRON HCL 4 MG/2ML IJ SOLN: 4.0000 mg | Freq: Four times a day (QID) | INTRAMUSCULAR | Status: DC | PRN

## 2018-05-06 MED ORDER — PHENYLEPHRINE 40 MCG/ML (10ML) SYRINGE FOR IV PUSH (FOR BLOOD PRESSURE SUPPORT)
PREFILLED_SYRINGE | INTRAVENOUS | Status: AC
  Filled 2018-05-06: qty 10

## 2018-05-06 MED ORDER — BUPIVACAINE HCL (PF) 0.5 % IJ SOLN
INTRAMUSCULAR | Status: DC | PRN
  Administered 2018-05-06: 10 mL

## 2018-05-06 MED ORDER — SULFAMETHOXAZOLE-TRIMETHOPRIM 800-160 MG PO TABS: 1.0000 | ORAL_TABLET | Freq: Two times a day (BID) | ORAL | 0 refills | Status: AC

## 2018-05-06 MED ORDER — PHENYLEPHRINE 40 MCG/ML (10ML) SYRINGE FOR IV PUSH (FOR BLOOD PRESSURE SUPPORT)
PREFILLED_SYRINGE | INTRAVENOUS | Status: DC | PRN
  Administered 2018-05-06 (×3): 120 ug via INTRAVENOUS

## 2018-05-06 MED ORDER — LIDOCAINE HCL (CARDIAC) PF 100 MG/5ML IV SOSY
PREFILLED_SYRINGE | INTRAVENOUS | Status: DC | PRN
  Administered 2018-05-06: 80 mg via INTRAVENOUS

## 2018-05-06 MED ORDER — AMOXICILLIN-POT CLAVULANATE 875-125 MG PO TABS: 1.0000 | ORAL_TABLET | Freq: Two times a day (BID) | ORAL | 0 refills | Status: DC

## 2018-05-06 MED ORDER — FENTANYL CITRATE (PF) 100 MCG/2ML IJ SOLN
INTRAMUSCULAR | Status: DC | PRN
  Administered 2018-05-06: 50 ug via INTRAVENOUS

## 2018-05-06 MED ORDER — MAGNESIUM CITRATE PO SOLN: 1.0000 | Freq: Once | ORAL | Status: DC | PRN

## 2018-05-06 MED ORDER — DEXAMETHASONE SODIUM PHOSPHATE 10 MG/ML IJ SOLN
INTRAMUSCULAR | Status: DC | PRN
  Administered 2018-05-06: 10 mg via INTRAVENOUS

## 2018-05-06 MED ORDER — DOXYCYCLINE HYCLATE 50 MG PO CAPS: 50.0000 mg | ORAL_CAPSULE | Freq: Two times a day (BID) | ORAL | 0 refills | Status: DC

## 2018-05-06 MED ORDER — ONDANSETRON HCL 4 MG PO TABS: 4.0000 mg | ORAL_TABLET | Freq: Four times a day (QID) | ORAL | Status: DC | PRN

## 2018-05-06 MED ORDER — ONDANSETRON HCL 4 MG/2ML IJ SOLN
INTRAMUSCULAR | Status: AC
  Filled 2018-05-06: qty 2

## 2018-05-06 MED ORDER — MIDAZOLAM HCL 5 MG/5ML IJ SOLN
INTRAMUSCULAR | Status: DC | PRN
  Administered 2018-05-06: 2 mg via INTRAVENOUS

## 2018-05-06 MED ORDER — POTASSIUM CHLORIDE IN NACL 20-0.9 MEQ/L-% IV SOLN
INTRAVENOUS | Status: DC
  Filled 2018-05-06: qty 1000

## 2018-05-06 MED ORDER — DOCUSATE SODIUM 100 MG PO CAPS: 100.0000 mg | ORAL_CAPSULE | Freq: Two times a day (BID) | ORAL | Status: DC

## 2018-05-06 MED ORDER — HYDROCODONE-ACETAMINOPHEN 10-325 MG PO TABS: 1.0000 | ORAL_TABLET | ORAL | Status: DC | PRN

## 2018-05-06 MED ORDER — BUPIVACAINE HCL (PF) 0.5 % IJ SOLN
INTRAMUSCULAR | Status: AC
  Filled 2018-05-06: qty 10

## 2018-05-06 SURGICAL SUPPLY — 42 items
BANDAGE ACE 4X5 VEL STRL LF (GAUZE/BANDAGES/DRESSINGS) IMPLANT
BANDAGE ACE 6X5 VEL STRL LF (GAUZE/BANDAGES/DRESSINGS) IMPLANT
BNDG COHESIVE 4X5 TAN STRL (GAUZE/BANDAGES/DRESSINGS) IMPLANT
BNDG CONFORM 2 STRL LF (GAUZE/BANDAGES/DRESSINGS) ×3 IMPLANT
BNDG ELASTIC 2X5.8 VLCR STR LF (GAUZE/BANDAGES/DRESSINGS) ×3 IMPLANT
BNDG GAUZE ELAST 4 BULKY (GAUZE/BANDAGES/DRESSINGS) IMPLANT
BOOTCOVER CLEANROOM LRG (PROTECTIVE WEAR) IMPLANT
COVER SURGICAL LIGHT HANDLE (MISCELLANEOUS) ×3 IMPLANT
COVER WAND RF STERILE (DRAPES) IMPLANT
CUFF TOURNIQUET SINGLE 34IN LL (TOURNIQUET CUFF) IMPLANT
DURAPREP 26ML APPLICATOR (WOUND CARE) ×3 IMPLANT
ELECT REM PT RETURN 9FT ADLT (ELECTROSURGICAL)
ELECTRODE REM PT RTRN 9FT ADLT (ELECTROSURGICAL) IMPLANT
EVACUATOR 1/8 PVC DRAIN (DRAIN) IMPLANT
GAUZE SPONGE 4X4 12PLY STRL (GAUZE/BANDAGES/DRESSINGS) IMPLANT
GAUZE XEROFORM 1X8 LF (GAUZE/BANDAGES/DRESSINGS) ×3 IMPLANT
GLOVE BIOGEL PI ORTHO PRO SZ8 (GLOVE) ×2
GLOVE ORTHO TXT STRL SZ7.5 (GLOVE) ×3 IMPLANT
GLOVE PI ORTHO PRO STRL SZ8 (GLOVE) ×1 IMPLANT
GLOVE SURG ORTHO 8.0 STRL STRW (GLOVE) ×6 IMPLANT
GOWN STRL REUS W/ TWL LRG LVL3 (GOWN DISPOSABLE) IMPLANT
GOWN STRL REUS W/TWL LRG LVL3 (GOWN DISPOSABLE)
HANDPIECE INTERPULSE COAX TIP (DISPOSABLE)
KIT BASIN OR (CUSTOM PROCEDURE TRAY) ×3 IMPLANT
KIT TURNOVER KIT B (KITS) ×3 IMPLANT
MANIFOLD NEPTUNE II (INSTRUMENTS) ×3 IMPLANT
NS IRRIG 1000ML POUR BTL (IV SOLUTION) ×3 IMPLANT
PACK ORTHO EXTREMITY (CUSTOM PROCEDURE TRAY) ×3 IMPLANT
PAD ARMBOARD 7.5X6 YLW CONV (MISCELLANEOUS) ×6 IMPLANT
SET HNDPC FAN SPRY TIP SCT (DISPOSABLE) IMPLANT
SET IRRIG Y TYPE TUR BLADDER L (SET/KITS/TRAYS/PACK) ×3 IMPLANT
SPONGE LAP 18X18 X RAY DECT (DISPOSABLE) ×3 IMPLANT
STOCKINETTE IMPERVIOUS 9X36 MD (GAUZE/BANDAGES/DRESSINGS) IMPLANT
SUT ETHILON 3 0 PS 1 (SUTURE) IMPLANT
SWAB CULTURE ESWAB REG 1ML (MISCELLANEOUS) ×3 IMPLANT
TOWEL OR 17X24 6PK STRL BLUE (TOWEL DISPOSABLE) ×3 IMPLANT
TOWEL OR 17X26 10 PK STRL BLUE (TOWEL DISPOSABLE) ×3 IMPLANT
TUBE CONNECTING 12'X1/4 (SUCTIONS) ×1
TUBE CONNECTING 12X1/4 (SUCTIONS) ×2 IMPLANT
UNDERPAD 30X30 (UNDERPADS AND DIAPERS) ×3 IMPLANT
WATER STERILE IRR 1000ML POUR (IV SOLUTION) IMPLANT
YANKAUER SUCT BULB TIP NO VENT (SUCTIONS) ×3 IMPLANT

## 2018-05-06 NOTE — Discharge Summary (Signed)
Physician Discharge Summary  [name]:[id number] DOB: 1971/03/05 DOA: 05/05/2018  PCP: Aida Puffer, MD  Admit date: 05/05/2018 Discharge date: 05/06/2018  Time spent: 35 minutes  Recommendations for Outpatient Follow-up:  1. Follow up outpatient CBC/CMP 2. Follow up final cultures and adjust abx as indicated 3. Follow up with Dr. Dion Saucier as outpatient    Discharge Diagnoses:  Principal Problem:   Paronychia of left little finger Active Problems:   HTN (hypertension)   Discharge Condition: stable  Diet recommendation: heart healthy  Filed Weights   05/05/18 0916  Weight: 120.2 kg    History of present illness:  Douglas Whitney is Douglas Whitney 47 y.o. male with medical history significant of hypertension-presenting with the above-noted complaints.  For the past 3 days or so-he has noted that his left fifth finger was much more swollen and tender.  He went to his primary care practitioner-was given Akiva Josey shot of penicillin, subsequently placed on doxycycline and Bactrim along with prednisone.  In spite of these measures patient's left fifth finger continued to worsen-and as result presented to med center [facility] for evaluation.  He was found to have leukocytosis, he was subsequently transferred to the inpatient service of trial hospitalist for further evaluation and treatment.  Patient does acknowledge subjective fever.  Denies any headache, chest pain, shortness of breath, nausea vomiting or diarrhea.  He was admitted for Hong Timm left 5th finger paronychia.  He was I&D'd by Dr. Dion Saucier on hospital day 1.  He was discharged on bactrim and augmentin with plans to f/u with Dr. Dion Saucier as an outpatient.  Hospital Course:  Left fifth finger soft tissue infection:  - Discharge on bactrim and augmentin (pt notes this got worse after hunting and getting into intestines of buck) - Follow up with Dr. Dion Saucier as outpatinet - S/p incision, irrigation, debridement, with nail removal, L small finger  paranoychia (11/29)  Hypertension: resume home meds  Procedures:  11/29 - S/p incision, irrigation, debridement, with nail removal, L small finger paranoychia (11/29)   Consultations:  orthopedics  Discharge Exam: Vitals:   05/06/18 1000 05/06/18 1014  BP: 138/89 (!) 135/99  Pulse: 67 67  Resp: 16 18  Temp:  (!) 97.5 F (36.4 C)  SpO2: 97% 98%   Feeling better. Ready to go.  General: No acute distress. Cardiovascular: Heart sounds show Sinda Leedom regular rate, and rhythm Lungs: Clear to auscultation bilaterally  Abdomen: Soft, nontender, nondistended Neurological: Alert and oriented 3. Moves all extremities 4. Cranial nerves II through XII grossly intact. Skin: Warm and dry. No rashes or lesions. Extremities: L hand dressed Psychiatric: Mood and affect are normal. Insight and judgment are appropriate.  Discharge Instructions   Discharge Instructions    Call MD for:  difficulty breathing, headache or visual disturbances   Complete by:  As directed    Call MD for:  extreme fatigue   Complete by:  As directed    Call MD for:  hives   Complete by:  As directed    Call MD for:  persistant dizziness or light-headedness   Complete by:  As directed    Call MD for:  persistant nausea and vomiting   Complete by:  As directed    Call MD for:  redness, tenderness, or signs of infection (pain, swelling, redness, odor or green/yellow discharge around incision site)   Complete by:  As directed    Call MD for:  severe uncontrolled pain   Complete by:  As directed  Diet - low sodium heart healthy   Complete by:  As directed    Discharge instructions   Complete by:  As directed    You were seen for an infection of your left small finger.  This has been treated with incision and drainage by Dr. Dion SaucierLandau.  We will send you with bactrim and augmentin at discharge.    Please follow up with Dr. Dion SaucierLandau in 1 week as scheduled.   Please follow up the final results of the culture, this  will let you know if antibiotics can be changed or stopped.   Return for new, recurrent or worsening symptoms.  Please ask your PCP to request records from this hospitalization so they know what was done and what the next steps will be.   Increase activity slowly   Complete by:  As directed      Allergies as of 05/06/2018   No Known Allergies     Medication List    STOP taking these medications   doxycycline 100 MG tablet Commonly known as:  VIBRA-TABS   oxyCODONE-acetaminophen 5-325 MG tablet Commonly known as:  PERCOCET/ROXICET     TAKE these medications   amoxicillin-clavulanate 875-125 MG tablet Commonly known as:  AUGMENTIN Take 1 tablet by mouth 2 (two) times daily for 5 days.   lisinopril-hydrochlorothiazide 10-12.5 MG tablet Commonly known as:  PRINZIDE,ZESTORETIC Take 1 tablet by mouth daily.   meloxicam 15 MG tablet Commonly known as:  MOBIC Take 15 mg by mouth daily as needed for pain.   methocarbamol 500 MG tablet Commonly known as:  ROBAXIN Take 500 mg by mouth 2 (two) times daily as needed for spasms.   metoprolol succinate 100 MG 24 hr tablet Commonly known as:  TOPROL-XL Take 100 mg by mouth daily.   multivitamin with minerals Tabs tablet Take 1 tablet by mouth daily.   omega-3 acid ethyl esters 1 g capsule Commonly known as:  LOVAZA Take 2 g by mouth daily.   oxyCODONE 5 MG immediate release tablet Commonly known as:  Oxy IR/ROXICODONE Take 1 tablet (5 mg total) by mouth every 4 (four) hours as needed for severe pain.   sennosides-docusate sodium 8.6-50 MG tablet Commonly known as:  SENOKOT-S Take 2 tablets by mouth daily.   sulfamethoxazole-trimethoprim 800-160 MG tablet Commonly known as:  BACTRIM DS,SEPTRA DS Take 1 tablet by mouth 2 (two) times daily for 10 days.      No Known Allergies Follow-up Information    Teryl LucyLandau, Joshua, MD. Schedule an appointment as soon as possible for Douglas Whitney visit in 1 week.   Specialty:  Orthopedic  Surgery Contact information: [address] [place] [address] [phone]            The results of significant diagnostics from this hospitalization (including imaging, microbiology, ancillary and laboratory) are listed below for reference.    Significant Diagnostic Studies: Dg Finger Little Left  Result Date: 05/05/2018 CLINICAL DATA:  Erythema to LEFT fifth finger since Monday, swelling. EXAM: LEFT LITTLE FINGER 2+V COMPARISON:  None. FINDINGS: Osseous alignment is normal. Bone mineralization is normal. No acute or suspicious osseous lesion. No fracture line or displaced fracture fragment. No destructive changes to suggest osteomyelitis. No soft tissue gas or foreign body. IMPRESSION: Negative. Electronically Signed   By: Bary RichardStan  Maynard M.D.   On: 05/05/2018 10:11    Microbiology: Recent Results (from the past 240 hour(s))  Wound or Superficial Culture     Status: None (Preliminary result)  Collection Time: 05/05/18 11:36 AM  Result Value Ref Range Status   Specimen Description   Final    WOUND Performed at [facility], [address] [place], [place] [address]    Special Requests   Final    NONE LEFT HAND 5TH FINGER Performed at [facility], [address] [place], [place] [address]    Gram Stain   Final    RARE WBC PRESENT, PREDOMINANTLY PMN NO ORGANISMS SEEN    Culture   Final    NO GROWTH < 24 HOURS Performed at [facility], [address] [place], [place] [address]    Report Status PENDING  Incomplete  MRSA PCR Screening     Status: None   Collection Time: 05/05/18  3:35 PM  Result Value Ref Range Status   MRSA by PCR NEGATIVE NEGATIVE Final    Comment:        The GeneXpert MRSA Assay (FDA approved for NASAL specimens only), is one component of Lanore Renderos comprehensive MRSA colonization surveillance program. It is not intended to diagnose MRSA infection nor to guide or monitor treatment for MRSA  infections. Performed at [facility], [address], [place], [place] [address]   Aerobic/Anaerobic Culture (surgical/deep wound)     Status: None (Preliminary result)   Collection Time: 05/06/18  9:14 AM  Result Value Ref Range Status   Specimen Description FINGER  Final   Special Requests NONE  Final   Gram Stain   Final    NO WBC SEEN RARE GRAM POSITIVE COCCI IN PAIRS Performed at [facility], [address], [place], [place] [address]    Culture PENDING  Incomplete   Report Status PENDING  Incomplete     Labs: Basic Metabolic Panel: Recent Labs  Lab 05/05/18 1038 05/05/18 1631 05/06/18 0119  NA 136  --  137  K 3.8  --  3.5  CL 100  --  102  CO2 27  --  26  GLUCOSE 125*  --  121*  BUN 13  --  12  CREATININE 0.94 1.12 0.91  CALCIUM 9.5  --  8.7*   Liver Function Tests: No results for input(s): AST, ALT, ALKPHOS, BILITOT, PROT, ALBUMIN in the last 168 hours. No results for input(s): LIPASE, AMYLASE in the last 168 hours. No results for input(s): AMMONIA in the last 168 hours. CBC: Recent Labs  Lab 05/05/18 1038 05/05/18 1631 05/06/18 0119  WBC 12.5* 12.1* 11.0*  NEUTROABS 11.1*  --   --   HGB 17.6* 17.7* 15.9  HCT 53.3* 51.6 48.9  MCV 93.5 91.7 94.0  PLT 273 286 272   Cardiac Enzymes: No results for input(s): CKTOTAL, CKMB, CKMBINDEX, TROPONINI in the last 168 hours. BNP: BNP (last 3 results) No results for input(s): BNP in the last 8760 hours.  ProBNP (last 3 results) No results for input(s): PROBNP in the last 8760 hours.  CBG: No results for input(s): GLUCAP in the last 168 hours.     Signed:  Lacretia Nicks MD.  [facility] 05/06/2018, 8:55 PM

## 2018-05-06 NOTE — Plan of Care (Signed)
  Problem: Education: Goal: Knowledge of General Education information will improve Description: Including pain rating scale, medication(s)/side effects and non-pharmacologic comfort measures Outcome: Progressing   Problem: Health Behavior/Discharge Planning: Goal: Ability to manage health-related needs will improve Outcome: Progressing   Problem: Clinical Measurements: Goal: Will remain free from infection Outcome: Progressing   Problem: Activity: Goal: Risk for activity intolerance will decrease Outcome: Progressing   Problem: Nutrition: Goal: Adequate nutrition will be maintained Outcome: Progressing   Problem: Pain Managment: Goal: General experience of comfort will improve Outcome: Progressing   Problem: Safety: Goal: Ability to remain free from injury will improve Outcome: Progressing   Problem: Skin Integrity: Goal: Risk for impaired skin integrity will decrease Outcome: Progressing   

## 2018-05-06 NOTE — Transfer of Care (Signed)
Immediate Anesthesia Transfer of Care Note  Patient: Douglas Whitney  Procedure(s) Performed: IRRIGATION AND DEBRIDEMENT SMALL FINGER (Left Finger)  Patient Location: PACU  Anesthesia Type:General  Level of Consciousness: oriented, drowsy and patient cooperative  Airway & Oxygen Therapy: Patient Spontanous Breathing and Patient connected to nasal cannula oxygen  Post-op Assessment: Report given to RN and Post -op Vital signs reviewed and stable  Post vital signs: Reviewed  Last Vitals:  Vitals Value Taken Time  BP 110/72 05/06/2018  9:50 AM  Temp 36.5 C 05/06/2018  9:48 AM  Pulse 68 05/06/2018  9:52 AM  Resp 12 05/06/2018  9:52 AM  SpO2 98 % 05/06/2018  9:52 AM  Vitals shown include unvalidated device data.  Last Pain:  Vitals:   05/06/18 0948  TempSrc:   PainSc: (P) 0-No pain      Patients Stated Pain Goal: 0 (05/06/18 0735)  Complications: No apparent anesthesia complications

## 2018-05-06 NOTE — Anesthesia Procedure Notes (Signed)
Procedure Name: LMA Insertion Date/Time: 05/06/2018 8:50 AM Performed by: Lovie Cholock, Fujiko Picazo K, CRNA Pre-anesthesia Checklist: Patient identified, Emergency Drugs available, Suction available and Patient being monitored Patient Re-evaluated:Patient Re-evaluated prior to induction Oxygen Delivery Method: Circle System Utilized Preoxygenation: Pre-oxygenation with 100% oxygen Induction Type: IV induction Ventilation: Mask ventilation without difficulty LMA: LMA inserted LMA Size: 5.0 Number of attempts: 1 Airway Equipment and Method: Bite block Placement Confirmation: positive ETCO2 and breath sounds checked- equal and bilateral Tube secured with: Tape Dental Injury: Teeth and Oropharynx as per pre-operative assessment

## 2018-05-06 NOTE — Progress Notes (Signed)
Patient and family had requested discharge home today after the procedure.   Dc summary and meds completed.  Appreciate medical assistance in his care, ok for discharge from my standpoint.  RTC with me in 1 week.    Eulas PostJoshua P Cheralyn Oliver, MD

## 2018-05-06 NOTE — Plan of Care (Signed)

## 2018-05-06 NOTE — Progress Notes (Signed)
Pharmacy Antibiotic Note  Douglas Whitney is a 47 y.o. male admitted on 05/05/2018 with left 5th finger swelling and pain x 3 days.  Patient was seen by PCP and given PCN shot followed by PO doxycycline/Bactrim/prednisone.  His finger did not improve so [facility] and then [facility].  Patient is s/p I&D today 05/06/18.  Pharmacy has been consulted for vancomycin and Unasyn dosing.  Noted he received vancomycin 1500mg  IV and Ancef 3gm IV pre-op.   SCr 0.91, CrCL 100 ml/min Afebrile, WBC WNL   Plan: Vanc 1gm IV Q8H, start now as pre-op dose was sub-optimal as loading dose Unasyn 3gm IV Q8H Monitor renal fxn, clinical progress, vanc trough as indicated   Height: 6\' 3"  (190.5 cm) Weight: 265 lb (120.2 kg) IBW/kg (Calculated) : 84.5  Temp (24hrs), Avg:98 F (36.7 C), Min:97.5 F (36.4 C), Max:98.3 F (36.8 C)  Recent Labs  Lab 05/05/18 1038 05/05/18 1631 05/06/18 0119  WBC 12.5* 12.1* 11.0*  CREATININE 0.94 1.12 0.91    Estimated Creatinine Clearance: 137.2 mL/min (by C-G formula based on SCr of 0.91 mg/dL).    No Known Allergies   Vanc 11/29 >> Unasyn 11/29 >> Clinda 11/28 x1 Ancef pre-op 11/29 x1   11/28 MRSA PCR - negative 11/28 left 5th finger -   Madeleine Fenn D. Laney Potashang, PharmD, BCPS, BCCCP 05/06/2018, 10:28 AM

## 2018-05-06 NOTE — Anesthesia Postprocedure Evaluation (Signed)
Anesthesia Post Note  Patient: Douglas Whitney  Procedure(s) Performed: IRRIGATION AND DEBRIDEMENT SMALL FINGER (Left Finger)     Patient location during evaluation: PACU Anesthesia Type: General Level of consciousness: awake and alert Pain management: pain level controlled Vital Signs Assessment: post-procedure vital signs reviewed and stable Respiratory status: spontaneous breathing, nonlabored ventilation and respiratory function stable Cardiovascular status: blood pressure returned to baseline and stable Postop Assessment: no apparent nausea or vomiting Anesthetic complications: no    Last Vitals:  Vitals:   05/06/18 1000 05/06/18 1014  BP: 138/89 (!) 135/99  Pulse: 67 67  Resp: 16 18  Temp:  (!) 36.4 C  SpO2: 97% 98%    Last Pain:  Vitals:   05/06/18 1014  TempSrc: Oral  PainSc: 0-No pain                 Beryle Lathehomas E Brock

## 2018-05-06 NOTE — Progress Notes (Signed)
Pt alert, able to make needs known. Denies pain at this time. Orders for discharge in chart. Pt and wife verbalized understand of discharge teaching. No acute distress noted.  Pt escorted to private vehicle by staff. All belongings returned to pt by family.

## 2018-05-06 NOTE — Op Note (Signed)
05/05/2018 - 05/06/2018  9:28 AM  PATIENT:  Douglas Whitney    PRE-OPERATIVE DIAGNOSIS: Left small finger paronychia   POST-OPERATIVE DIAGNOSIS:  Same  PROCEDURE: Incision, irrigation, debridement, with nail removal, left small finger paronychia  SURGEON:  Eulas PostJoshua P Quanisha Drewry, MD  PHYSICIAN ASSISTANT: Janace LittenBrandon Parry, OPA-C, present and scrubbed throughout the case, critical for completion in a timely fashion, and for retraction, instrumentation, and closure.  ANESTHESIA:   General  PREOPERATIVE INDICATIONS:  Douglas Whitney is a  47 y.o. 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  The risks benefits and alternatives were discussed with the patient preoperatively including but not limited to the risks of infection, bleeding, nerve injury, cardiopulmonary complications, the need for revision surgery, among others, and the patient was willing to proceed.  ESTIMATED BLOOD LOSS: Minimal  OPERATIVE IMPLANTS: None  OPERATIVE FINDINGS: 2 mL purulent fluid collection at the base of the nail in the midline.  The joint did not appear to be violated, the extensor tendon was intact from what I can appreciate, and there was one area over the middle phalanx that was cellulitic, I made a contralateral radial based incision but did not find any more purulence.  OPERATIVE PROCEDURE: The patient was brought to the operating room placed in the supine position.  General anesthesia was administered.  IV vancomycin and Ancef were given.  The left upper extremity was prepped and draped in usual sterile fashion.  Timeout performed.  The arm was elevated and the tourniquet was inflated to 250 mmHg.  I applied a digital block with 10 mL of half percent Marcaine along the ulnar border as well as palmar and within the webspace.    Incision was made along the  ulnar border of the proximal aspect of the nail, extending it past the germinal matrix.  Dissection was carried out underneath the soft tissue and the nail was freed.  The nail was removed.  A significant purulent pocket was encountered at the base of the nail, and this was debrided using a mosquito hemostat.  There was an area of significant cellulitic soft tissue swelling over the middle phalanx, I used an 18-gauge needle to attempt aspiration but no fluid was encountered.  Made a small incision over that area of erythema, and expressed some serous type fluid but no pus.  I irrigated a total of 6 L of fluid across both of the open wounds, these were left open to drain, sterile dressing was applied, the patient awakened and returned to the PACU in stable and satisfactory condition.  There were no complications and he tolerated the procedure well.

## 2018-05-06 NOTE — Discharge Summary (Signed)
Physician Discharge Summary  Patient ID: Douglas Whitney MRN: [id number] DOB/AGE: 11-10-1970 47 y.o.  Admit date: 05/05/2018 Discharge date: 05/06/2018  Admission Diagnoses:  Paronychia of left little finger  Discharge Diagnoses:  Principal Problem:   Paronychia of left little finger Active Problems:   HTN (hypertension)   Past Medical History:  Diagnosis Date  . Acid reflux   . Hypertension   . Seasonal allergies   . Sinusitis   . Snores   . Wears glasses     Surgeries: Procedure(s): IRRIGATION AND DEBRIDEMENT SMALL FINGER on 05/06/2018   Consultants (if any): Treatment Team:  Teryl Lucy, MD  Discharged Condition: Improved  Hospital Course: Douglas Whitney is an 47 y.o. male who was admitted 05/05/2018 with a diagnosis of Paronychia of left little finger and went to the operating room on 05/06/2018 and underwent the above named procedures.    He was given perioperative antibiotics:  Anti-infectives (From admission, onward)   Start     Dose/Rate Route Frequency Ordered Stop   05/06/18 1200  vancomycin (VANCOCIN) IVPB 1000 mg/200 mL premix     1,000 mg 200 mL/hr over 60 Minutes Intravenous Every 8 hours 05/06/18 1031     05/06/18 1100  Ampicillin-Sulbactam (UNASYN) 3 g in sodium chloride 0.9 % 100 mL IVPB     3 g 200 mL/hr over 30 Minutes Intravenous Every 8 hours 05/06/18 1031     05/06/18 0600  ceFAZolin (ANCEF) 3 g in dextrose 5 % 50 mL IVPB     3 g 100 mL/hr over 30 Minutes Intravenous On call to O.R. 05/05/18 1534 05/06/18 0853   05/06/18 0600  vancomycin (VANCOCIN) 1,500 mg in sodium chloride 0.9 % 500 mL IVPB     1,500 mg 250 mL/hr over 120 Minutes Intravenous On call to O.R. 05/05/18 1534 05/06/18 0932   05/06/18 0000  sulfamethoxazole-trimethoprim (BACTRIM DS,SEPTRA DS) 800-160 MG tablet     1 tablet Oral 2 times daily 05/06/18 0939     05/06/18 0000  doxycycline (VIBRAMYCIN) 50 MG capsule     50 mg Oral 2 times daily 05/06/18 0939     05/05/18  1130  clindamycin (CLEOCIN) IVPB 600 mg     600 mg 100 mL/hr over 30 Minutes Intravenous  Once 05/05/18 1115 05/05/18 1254    .  He was given sequential compression devices, early ambulation, and lovenox for DVT prophylaxis.  He benefited maximally from the hospital stay and there were no complications.    Recent vital signs:  Vitals:   05/06/18 1000 05/06/18 1014  BP: 138/89 (!) 135/99  Pulse: 67 67  Resp: 16 18  Temp:  (!) 97.5 F (36.4 C)  SpO2: 97% 98%    Recent laboratory studies:  Lab Results  Component Value Date   HGB 15.9 05/06/2018   HGB 17.7 (H) 05/05/2018   HGB 17.6 (H) 05/05/2018   Lab Results  Component Value Date   WBC 11.0 (H) 05/06/2018   PLT 272 05/06/2018   No results found for: INR Lab Results  Component Value Date   NA 137 05/06/2018   K 3.5 05/06/2018   CL 102 05/06/2018   CO2 26 05/06/2018   BUN 12 05/06/2018   CREATININE 0.91 05/06/2018   GLUCOSE 121 (H) 05/06/2018    Discharge Medications:   Allergies as of 05/06/2018   No Known Allergies     Medication List    STOP taking these medications   oxyCODONE-acetaminophen 5-325 MG tablet Commonly known as:  PERCOCET/ROXICET     TAKE these medications   doxycycline 50 MG capsule Commonly known as:  VIBRAMYCIN Take 1 capsule (50 mg total) by mouth 2 (two) times daily.   hydrochlorothiazide 25 MG tablet Commonly known as:  HYDRODIURIL Take 25 mg by mouth daily.   multivitamin with minerals Tabs tablet Take 1 tablet by mouth daily.   omega-3 acid ethyl esters 1 g capsule Commonly known as:  LOVAZA Take 2 g by mouth daily.   oxyCODONE 5 MG immediate release tablet Commonly known as:  Oxy IR/ROXICODONE Take 1 tablet (5 mg total) by mouth every 4 (four) hours as needed for severe pain.   sennosides-docusate sodium 8.6-50 MG tablet Commonly known as:  SENOKOT-S Take 2 tablets by mouth daily.   sulfamethoxazole-trimethoprim 800-160 MG tablet Commonly known as:  BACTRIM  DS,SEPTRA DS Take 1 tablet by mouth 2 (two) times daily.       Diagnostic Studies: Dg Finger Little Left  Result Date: 05/05/2018 CLINICAL DATA:  Erythema to LEFT fifth finger since Monday, swelling. EXAM: LEFT LITTLE FINGER 2+V COMPARISON:  None. FINDINGS: Osseous alignment is normal. Bone mineralization is normal. No acute or suspicious osseous lesion. No fracture line or displaced fracture fragment. No destructive changes to suggest osteomyelitis. No soft tissue gas or foreign body. IMPRESSION: Negative. Electronically Signed   By: Bary RichardStan  Maynard M.D.   On: 05/05/2018 10:11    Disposition: Discharge disposition: 01-Home or Self Care         Follow-up Information    Teryl LucyLandau, Lindsey Demonte, MD. Schedule an appointment as soon as possible for a visit in 1 week.   Specialty:  Orthopedic Surgery Contact information: [address] [place] [address] [phone]            Signed: Eulas PostJoshua P Jazzlin Clements 05/06/2018, 10:38 AM

## 2018-05-06 NOTE — Discharge Instructions (Signed)
Paronychia  Paronychia is an infection of the skin. It happens near a fingernail or toenail. It may cause pain and swelling around the nail. Usually, it is not serious and it clears up with treatment.  Follow these instructions at home:   Soak the fingers or toes in warm water as told by your doctor. You may be told to do this for 20 minutes, 2-3 times a day.   Keep the area dry when you are not soaking it.   Take medicines only as told by your doctor.   If you were given an antibiotic medicine, finish all of it even if you start to feel better.   Keep the affected area clean.   Do not try to drain a fluid-filled bump yourself.   Wear rubber gloves when putting your hands in water.   Wear gloves if your hands might touch cleaners or chemicals.   Follow your doctor's instructions about:  ? Wound care.  ? Bandage (dressing) changes and removal.  Contact a doctor if:   Your symptoms get worse or do not improve.   You have a fever or chills.   You have redness spreading from the affected area.   You have more fluid, blood, or pus coming from the affected area.   Your finger or knuckle is swollen or is hard to move.  This information is not intended to replace advice given to you by your health care provider. Make sure you discuss any questions you have with your health care provider.  Document Released: 05/13/2009 Document Revised: 10/31/2015 Document Reviewed: 05/02/2014  Elsevier Interactive Patient Education  2018 [employer]

## 2018-05-06 NOTE — Progress Notes (Signed)
Patient with worsening infection pain from his small finger.  He apparently did not get IV antibiotics except for the clindamycin given in the emergency room, until his preoperative antibiotics were administered earlier this morning.  His finger does in fact look more swollen, more red, and somewhat worse.  Plan for surgical I&D, nail removal, and exploration.  Risks benefits and alternatives and postoperative recovery course from the discussed in detail and all questions answered.  Eulas PostJoshua P Sherice Ijames, MD

## 2018-05-06 NOTE — Anesthesia Preprocedure Evaluation (Addendum)
Anesthesia Evaluation  Patient identified by MRN, date of birth, ID band Patient awake    Reviewed: Allergy & Precautions, NPO status , Patient's Chart, lab work & pertinent test results  History of Anesthesia Complications Negative for: history of anesthetic complications  Airway Mallampati: II  TM Distance: >3 FB Neck ROM: Full    Dental  (+) Teeth Intact, Dental Advisory Given   Pulmonary former smoker,    breath sounds clear to auscultation       Cardiovascular hypertension, Pt. on medications  Rhythm:Regular Rate:Normal     Neuro/Psych negative neurological ROS  negative psych ROS   GI/Hepatic Neg liver ROS, GERD  ,  Endo/Other  negative endocrine ROS Obesity   Renal/GU negative Renal ROS     Musculoskeletal negative musculoskeletal ROS (+)   Abdominal   Peds  Hematology negative hematology ROS (+)   Anesthesia Other Findings   Reproductive/Obstetrics                            Anesthesia Physical Anesthesia Plan  ASA: II  Anesthesia Plan: General   Post-op Pain Management:    Induction: Intravenous  PONV Risk Score and Plan: 2 and Treatment may vary due to age or medical condition, Ondansetron and Midazolam  Airway Management Planned: LMA  Additional Equipment: None  Intra-op Plan:   Post-operative Plan: Extubation in OR  Informed Consent:   Plan Discussed with: CRNA and Anesthesiologist  Anesthesia Plan Comments:         Anesthesia Quick Evaluation

## 2018-05-07 ENCOUNTER — Encounter (HOSPITAL_COMMUNITY): Payer: Self-pay | Admitting: Orthopedic Surgery

## 2018-05-09 LAB — AEROBIC CULTURE W GRAM STAIN (SUPERFICIAL SPECIMEN)

## 2018-05-11 LAB — AEROBIC/ANAEROBIC CULTURE (SURGICAL/DEEP WOUND): GRAM STAIN: NONE SEEN

## 2018-05-11 LAB — AEROBIC/ANAEROBIC CULTURE W GRAM STAIN (SURGICAL/DEEP WOUND)

## 2019-06-05 ENCOUNTER — Encounter (HOSPITAL_COMMUNITY): Payer: Self-pay | Admitting: Emergency Medicine

## 2019-06-05 ENCOUNTER — Other Ambulatory Visit: Payer: Self-pay

## 2019-06-05 ENCOUNTER — Emergency Department (HOSPITAL_COMMUNITY)
Admission: EM | Admit: 2019-06-05 | Discharge: 2019-06-05 | Disposition: A | Discharge status: Home / Self Care | Payer: BC Managed Care – PPO | Attending: Emergency Medicine | Admitting: Emergency Medicine

## 2019-06-05 ENCOUNTER — Emergency Department (HOSPITAL_COMMUNITY): Payer: BC Managed Care – PPO

## 2019-06-05 DIAGNOSIS — Z79899 Other long term (current) drug therapy: Secondary | ICD-10-CM | POA: Diagnosis not present

## 2019-06-05 DIAGNOSIS — J189 Pneumonia, unspecified organism: Secondary | ICD-10-CM

## 2019-06-05 DIAGNOSIS — I10 Essential (primary) hypertension: Secondary | ICD-10-CM | POA: Insufficient documentation

## 2019-06-05 DIAGNOSIS — Z87891 Personal history of nicotine dependence: Secondary | ICD-10-CM | POA: Insufficient documentation

## 2019-06-05 DIAGNOSIS — Z96649 Presence of unspecified artificial hip joint: Secondary | ICD-10-CM | POA: Diagnosis not present

## 2019-06-05 DIAGNOSIS — R0781 Pleurodynia: Secondary | ICD-10-CM

## 2019-06-05 DIAGNOSIS — R079 Chest pain, unspecified: Secondary | ICD-10-CM

## 2019-06-05 LAB — CBC WITH DIFFERENTIAL/PLATELET
Abs Immature Granulocytes: 0.05 10*3/uL (ref 0.00–0.07)
Basophils Absolute: 0.1 10*3/uL (ref 0.0–0.1)
Basophils Relative: 0 %
Eosinophils Absolute: 0.1 10*3/uL (ref 0.0–0.5)
Eosinophils Relative: 0 %
HCT: 50.1 % (ref 39.0–52.0)
Hemoglobin: 17 g/dL (ref 13.0–17.0)
Immature Granulocytes: 0 %
Lymphocytes Relative: 7 %
Lymphs Abs: 1 10*3/uL (ref 0.7–4.0)
MCH: 32 pg (ref 26.0–34.0)
MCHC: 33.9 g/dL (ref 30.0–36.0)
MCV: 94.2 fL (ref 80.0–100.0)
Monocytes Absolute: 0.7 10*3/uL (ref 0.1–1.0)
Monocytes Relative: 5 %
Neutro Abs: 12.4 10*3/uL — ABNORMAL HIGH (ref 1.7–7.7)
Neutrophils Relative %: 88 %
Platelets: 417 10*3/uL — ABNORMAL HIGH (ref 150–400)
RBC: 5.32 MIL/uL (ref 4.22–5.81)
RDW: 12.1 % (ref 11.5–15.5)
WBC: 14.3 10*3/uL — ABNORMAL HIGH (ref 4.0–10.5)
nRBC: 0 % (ref 0.0–0.2)

## 2019-06-05 LAB — COMPREHENSIVE METABOLIC PANEL
ALT: 45 U/L — ABNORMAL HIGH (ref 0–44)
AST: 20 U/L (ref 15–41)
Albumin: 3.9 g/dL (ref 3.5–5.0)
Alkaline Phosphatase: 59 U/L (ref 38–126)
Anion gap: 10 (ref 5–15)
BUN: 19 mg/dL (ref 6–20)
CO2: 24 mmol/L (ref 22–32)
Calcium: 8.9 mg/dL (ref 8.9–10.3)
Chloride: 102 mmol/L (ref 98–111)
Creatinine, Ser: 0.87 mg/dL (ref 0.61–1.24)
GFR calc Af Amer: >60 mL/min (ref >60)
GFR calc non Af Amer: >60 mL/min (ref >60)
Glucose, Bld: 124 mg/dL — ABNORMAL HIGH (ref 70–99)
Potassium: 3.9 mmol/L (ref 3.5–5.1)
Sodium: 136 mmol/L (ref 135–145)
Total Bilirubin: 1.6 mg/dL — ABNORMAL HIGH (ref 0.3–1.2)
Total Protein: 7.5 g/dL (ref 6.5–8.1)

## 2019-06-05 LAB — D-DIMER, QUANTITATIVE: D-Dimer, Quant: 1.27 ug/mL-FEU — ABNORMAL HIGH (ref 0.00–0.50)

## 2019-06-05 LAB — TROPONIN I (HIGH SENSITIVITY)
Troponin I (High Sensitivity): <2 ng/L (ref <18)
Troponin I (High Sensitivity): <2 ng/L (ref <18)

## 2019-06-05 MED ORDER — AMOXICILLIN-POT CLAVULANATE 875-125 MG PO TABS: 1.0000 | ORAL_TABLET | Freq: Two times a day (BID) | ORAL | 0 refills | Status: AC

## 2019-06-05 MED ORDER — IOHEXOL 350 MG/ML SOLN
100.0000 mL | Freq: Once | INTRAVENOUS | Status: AC | PRN
  Administered 2019-06-05: 100 mL via INTRAVENOUS

## 2019-06-05 MED ORDER — SODIUM CHLORIDE (PF) 0.9 % IJ SOLN
INTRAMUSCULAR | Status: AC
  Filled 2019-06-05: qty 50

## 2019-06-05 NOTE — ED Triage Notes (Signed)
Pt arrived via EMS. Pt was diagnosed with bronchitis 9 days ago by tele-health. Pt was prescribed a Z pack and prednisone, and finished the regimen on Saturday. Pt began to feel ill this morning at 2am. Pt woke up with SOB, cough, and severe pain to right pec area and intercostal space.

## 2019-06-05 NOTE — ED Notes (Signed)
Patient transported to CT 

## 2019-06-05 NOTE — ED Provider Notes (Signed)
Pt's care assumed 6:30 am  Ct chest shows right upper lobe pneumonia.  Pt counseled on results.  Pt given rx for augmentin.  Pt advised he needs to follow up with his MD.  Pt will need followup chest xray in 3 -4 weeks to assure area clears.    Fransico Meadow, [place] 06/05/19 1021    Hayden Rasmussen, MD 06/05/19 [phone]

## 2019-06-05 NOTE — Discharge Instructions (Signed)
Return if any problems.  See your Physician for recheck in 3-4 days  ?

## 2019-06-05 NOTE — ED Provider Notes (Addendum)
[facility] DEPT Provider Note   CSN: [id number] Arrival date & time: 06/05/19  0500     History Chief Complaint  Patient presents with  . Shortness of Breath    Douglas Whitney is a 48 y.o. male with a hx of acid reflux, hypertension presents to the Emergency Department complaining of acute, persistent right-sided chest pain and shortness of breath. Patient reports this woke him from sleep. He had associated diaphoresis and nausea. Patient reports approximately 1 week ago he had cough and shortness of breath. He reports often being diagnosed with bronchitis. He had a E visit with his primary care provider who prescribed azithromycin and prednisone. Patient reports he had felt very normal for several days before this happened. He said never had pain like this before. No syncope. No history of DVT/PE, dissection. Patient reports pain is significantly worse with breathing and better with rest. He rates his pain at a 10/10. Patient given magnesium, Solu-Medrol and 100 mcg of fentanyl by EMS prior to arrival.     The history is provided by the patient and medical records. No language interpreter was used.       Past Medical History:  Diagnosis Date  . Acid reflux   . Hypertension   . Seasonal allergies   . Sinusitis   . Snores   . Wears glasses     Patient Active Problem List   Diagnosis Date Noted  . Paronychia of left little finger 05/05/2018  . HTN (hypertension) 05/05/2018  . Cholelithiasis with chronic cholecystitis 07/28/2015  . Deviated septum 04/14/2013    Past Surgical History:  Procedure Laterality Date  . CHOLECYSTECTOMY N/A 08/01/2015   Procedure: LAPAROSCOPIC CHOLECYSTECTOMY WITH INTRAOPERATIVE CHOLANGIOGRAM;  Surgeon: Armandina Gemma, MD;  Location: [facility];  Service: General;  Laterality: N/A;  . ESOPHAGOGASTRODUODENOSCOPY  2017  . FINGER AMPUTATION Left 1983   cut off tip index finger  . FRACTURE SURGERY    . I & D EXTREMITY Left  05/06/2018   Procedure: IRRIGATION AND DEBRIDEMENT SMALL FINGER;  Surgeon: Marchia Bond, MD;  Location: [facility];  Service: Orthopedics;  Laterality: Left;  . JOINT REPLACEMENT    . NASAL SEPTOPLASTY W/ TURBINOPLASTY Bilateral 04/14/2013   Procedure: NASAL SEPTOPLASTY BILATERAL INFERIOR TURBINATE REDUCTION ;  Surgeon: Jerrell Belfast, MD;  Location: [facility];  Service: ENT;  Laterality: Bilateral;  . ORIF FINGER FRACTURE Left 1984   middle  . TOTAL HIP ARTHROPLASTY Right 03/2016  . WISDOM TOOTH EXTRACTION         History reviewed. No pertinent family history.  Social History   Tobacco Use  . Smoking status: Former Smoker    Packs/day: 1.00    Years: 7.00    Pack years: 7.00    Types: Cigarettes    Quit date: 04/10/1993    Years since quitting: 26.1  . Smokeless tobacco: Former Systems developer    Types: Chew, Snuff    Quit date: 1999  Substance Use Topics  . Alcohol use: Yes    Comment: 05/05/2018 "maybe 2 drinks/year"  . Drug use: No    Home Medications Prior to Admission medications   Medication Sig Start Date End Date Taking? Authorizing Provider  lisinopril-hydrochlorothiazide (PRINZIDE,ZESTORETIC) 10-12.5 MG tablet Take 1 tablet by mouth daily. 04/06/18   [provider]  meloxicam (MOBIC) 15 MG tablet Take 15 mg by mouth daily as needed for pain. 04/19/18   [provider]  methocarbamol (ROBAXIN) 500 MG tablet Take 500 mg by  mouth 2 (two) times daily as needed for spasms. 04/19/18   [provider]  metoprolol succinate (TOPROL-XL) 100 MG 24 hr tablet Take 100 mg by mouth daily. 04/30/18   [provider]  Multiple Vitamin (MULTIVITAMIN WITH MINERALS) TABS tablet Take 1 tablet by mouth daily.    [provider]  omega-3 acid ethyl esters (LOVAZA) 1 G capsule Take 2 g by mouth daily.    [provider]  oxyCODONE (ROXICODONE) 5 MG immediate release tablet Take 1 tablet (5 mg total) by mouth every 4 (four) hours  as needed for severe pain. 05/06/18   Teryl LucyLandau, Joshua, MD  sennosides-docusate sodium (SENOKOT-S) 8.6-50 MG tablet Take 2 tablets by mouth daily. 05/06/18   Teryl LucyLandau, Joshua, MD    Allergies    Patient has no known allergies.  Review of Systems   Review of Systems  Constitutional: Negative for appetite change, diaphoresis, fatigue, fever and unexpected weight change.  HENT: Negative for mouth sores.   Eyes: Negative for visual disturbance.  Respiratory: Positive for cough and shortness of breath. Negative for chest tightness and wheezing.   Cardiovascular: Positive for chest pain. Negative for palpitations and leg swelling.  Gastrointestinal: Negative for abdominal pain, constipation, diarrhea, nausea and vomiting.  Endocrine: Negative for polydipsia, polyphagia and polyuria.  Genitourinary: Negative for dysuria, frequency, hematuria and urgency.  Musculoskeletal: Negative for back pain and neck stiffness.  Skin: Negative for rash.  Allergic/Immunologic: Negative for immunocompromised state.  Neurological: Negative for syncope, light-headedness and headaches.  Hematological: Does not bruise/bleed easily.  Psychiatric/Behavioral: Negative for sleep disturbance. The patient is not nervous/anxious.     Physical Exam Updated Vital Signs BP 136/79   Pulse 65   Temp 99.5 F (37.5 C) (Oral)   Resp 14   Ht 6\' 4"  (1.93 m)   Wt 116.1 kg   SpO2 98%   BMI 31.16 kg/m   Physical Exam Vitals and nursing note reviewed.  Constitutional:      Appearance: He is not diaphoretic.     Comments: Patient uncomfortable in the bed  HENT:     Head: Normocephalic.  Eyes:     General: No scleral icterus.    Conjunctiva/sclera: Conjunctivae normal.  Cardiovascular:     Rate and Rhythm: Normal rate and regular rhythm.     Pulses: Normal pulses.          Radial pulses are 2+ on the right side and 2+ on the left side.  Pulmonary:     Effort: Tachypnea present. No accessory muscle usage, prolonged  expiration, respiratory distress or retractions.     Breath sounds: Normal breath sounds. No stridor. No decreased breath sounds, wheezing, rhonchi or rales.     Comments: Equal chest rise. Mild increased work of breathing. Chest:     Chest wall: No tenderness.     Comments: Chest is nontender. No ecchymosis, flail segment or crepitus. Abdominal:     General: There is no distension.     Palpations: Abdomen is soft.     Tenderness: There is no abdominal tenderness. There is no guarding or rebound.  Musculoskeletal:     Cervical back: Normal range of motion.     Right lower leg: No tenderness. No edema.     Left lower leg: No tenderness. No edema.     Comments: Moves all extremities equally and without difficulty.  Skin:    General: Skin is warm and dry.     Capillary Refill: Capillary refill takes less than  2 seconds.  Neurological:     Mental Status: He is alert.     GCS: GCS eye subscore is 4. GCS verbal subscore is 5. GCS motor subscore is 6.     Comments: Speech is clear and goal oriented.  Psychiatric:        Mood and Affect: Mood normal.     ED Results / Procedures / Treatments   Labs (all labs ordered are listed, but only abnormal results are displayed) Labs Reviewed  CBC WITH DIFFERENTIAL/PLATELET - Abnormal; Notable for the following components:      Result Value   WBC 14.3 (*)    Platelets 417 (*)    Neutro Abs 12.4 (*)    All other components within normal limits  COMPREHENSIVE METABOLIC PANEL - Abnormal; Notable for the following components:   Glucose, Bld 124 (*)    ALT 45 (*)    Total Bilirubin 1.6 (*)    All other components within normal limits  D-DIMER, QUANTITATIVE (NOT AT [facility]) - Abnormal; Notable for the following components:   D-Dimer, Quant 1.27 (*)    All other components within normal limits  TROPONIN I (HIGH SENSITIVITY)    EKG EKG Interpretation  Date/Time:  Monday June 05 2019 05:42:18 EST Ventricular Rate:  57 PR Interval:    QRS  Duration: 97 QT Interval:  416 QTC Calculation: 405 R Axis:   22 Text Interpretation: Sinus rhythm Borderline prolonged PR interval ST elev, probable normal early repol pattern No significant change since last tracing Confirmed by Rochele Raring [phone]) on 06/05/2019 6:50:08 AM   Procedures Procedures (including critical care time)  Medications Ordered in ED Medications - No data to display  ED Course  I have reviewed the triage vital signs and the nursing notes.  Pertinent labs & imaging results that were available during my care of the patient were reviewed by me and considered in my medical decision making (see chart for details).  Clinical Course as of Jun 04 650  Mon Jun 05, 2019  0650 elevated  D-Dimer, Quant(!): 1.27 [HM]    Clinical Course User Index [HM] Carrel Leather, Boyd Kerbs   MDM Rules/Calculators/A&P                      Patient presents with right-sided chest pain and shortness of breath which woke him from sleep. Associated diaphoresis with EMS. They report severe pain and respiratory distress. Patient given magnesium and Solu-Medrol along with 100 mcg of fentanyl. This has improved his symptoms some. On arrival, patient is very uncomfortable in bed. He has visible pain with inspiration. He is afebrile without tachycardia or hypotension. No hypoxia. No known Covid contacts.  6:51 AM Pt continues to have pain.  Mild leukocytosis noted. D-dimer elevated, raising concern for possible PE.  CT angio ordered.    At shift change care was transferred to Langston Masker, PA-C who will follow pending studies, re-evaulate and determine disposition.     Final Clinical Impression(s) / ED Diagnoses Final diagnoses:  Right-sided chest pain  Pleuritic chest pain    Rx / DC Orders ED Discharge Orders    None       Joylynn Defrancesco, Boyd Kerbs 06/05/19 6045    Ward, Layla Maw, DO 06/05/19 [phone]

## 2019-06-18 ENCOUNTER — Encounter (HOSPITAL_BASED_OUTPATIENT_CLINIC_OR_DEPARTMENT_OTHER): Payer: Self-pay

## 2019-06-18 ENCOUNTER — Emergency Department (HOSPITAL_BASED_OUTPATIENT_CLINIC_OR_DEPARTMENT_OTHER): Payer: BC Managed Care – PPO

## 2019-06-18 ENCOUNTER — Other Ambulatory Visit: Payer: Self-pay

## 2019-06-18 ENCOUNTER — Emergency Department (HOSPITAL_BASED_OUTPATIENT_CLINIC_OR_DEPARTMENT_OTHER)
Admission: EM | Admit: 2019-06-18 | Discharge: 2019-06-18 | Disposition: A | Discharge status: Home / Self Care | Payer: BC Managed Care – PPO | Attending: Emergency Medicine | Admitting: Emergency Medicine

## 2019-06-18 DIAGNOSIS — Z79899 Other long term (current) drug therapy: Secondary | ICD-10-CM | POA: Insufficient documentation

## 2019-06-18 DIAGNOSIS — Z20822 Contact with and (suspected) exposure to covid-19: Secondary | ICD-10-CM | POA: Insufficient documentation

## 2019-06-18 DIAGNOSIS — I10 Essential (primary) hypertension: Secondary | ICD-10-CM | POA: Insufficient documentation

## 2019-06-18 DIAGNOSIS — Z87891 Personal history of nicotine dependence: Secondary | ICD-10-CM | POA: Diagnosis not present

## 2019-06-18 DIAGNOSIS — R05 Cough: Secondary | ICD-10-CM | POA: Diagnosis present

## 2019-06-18 DIAGNOSIS — J181 Lobar pneumonia, unspecified organism: Secondary | ICD-10-CM | POA: Insufficient documentation

## 2019-06-18 DIAGNOSIS — J189 Pneumonia, unspecified organism: Secondary | ICD-10-CM

## 2019-06-18 LAB — CBC WITH DIFFERENTIAL/PLATELET
Abs Immature Granulocytes: 0.04 10*3/uL (ref 0.00–0.07)
Basophils Absolute: 0.1 10*3/uL (ref 0.0–0.1)
Basophils Relative: 1 %
Eosinophils Absolute: 0.3 10*3/uL (ref 0.0–0.5)
Eosinophils Relative: 2 %
HCT: 47 % (ref 39.0–52.0)
Hemoglobin: 15.7 g/dL (ref 13.0–17.0)
Immature Granulocytes: 0 %
Lymphocytes Relative: 13 %
Lymphs Abs: 2 10*3/uL (ref 0.7–4.0)
MCH: 32 pg (ref 26.0–34.0)
MCHC: 33.4 g/dL (ref 30.0–36.0)
MCV: 95.7 fL (ref 80.0–100.0)
Monocytes Absolute: 1 10*3/uL (ref 0.1–1.0)
Monocytes Relative: 7 %
Neutro Abs: 11.3 10*3/uL — ABNORMAL HIGH (ref 1.7–7.7)
Neutrophils Relative %: 77 %
Platelets: 377 10*3/uL (ref 150–400)
RBC: 4.91 MIL/uL (ref 4.22–5.81)
RDW: 12.1 % (ref 11.5–15.5)
WBC: 14.8 10*3/uL — ABNORMAL HIGH (ref 4.0–10.5)
nRBC: 0 % (ref 0.0–0.2)

## 2019-06-18 LAB — COMPREHENSIVE METABOLIC PANEL
ALT: 24 U/L (ref 0–44)
AST: 12 U/L — ABNORMAL LOW (ref 15–41)
Albumin: 3.6 g/dL (ref 3.5–5.0)
Alkaline Phosphatase: 60 U/L (ref 38–126)
Anion gap: 10 (ref 5–15)
BUN: 9 mg/dL (ref 6–20)
CO2: 32 mmol/L (ref 22–32)
Calcium: 8.9 mg/dL (ref 8.9–10.3)
Chloride: 95 mmol/L — ABNORMAL LOW (ref 98–111)
Creatinine, Ser: 1.03 mg/dL (ref 0.61–1.24)
GFR calc Af Amer: >60 mL/min (ref >60)
GFR calc non Af Amer: >60 mL/min (ref >60)
Glucose, Bld: 91 mg/dL (ref 70–99)
Potassium: 4.1 mmol/L (ref 3.5–5.1)
Sodium: 137 mmol/L (ref 135–145)
Total Bilirubin: 1.2 mg/dL (ref 0.3–1.2)
Total Protein: 7.5 g/dL (ref 6.5–8.1)

## 2019-06-18 LAB — SARS CORONAVIRUS 2 (TAT 6-24 HRS): SARS Coronavirus 2: NEGATIVE

## 2019-06-18 LAB — SARS CORONAVIRUS 2 AG (30 MIN TAT): SARS Coronavirus 2 Ag: NEGATIVE

## 2019-06-18 LAB — LACTIC ACID, PLASMA: Lactic Acid, Venous: 1.2 mmol/L (ref 0.5–1.9)

## 2019-06-18 MED ORDER — LEVOFLOXACIN 750 MG PO TABS: 750.0000 mg | ORAL_TABLET | Freq: Every day | ORAL | 0 refills | Status: DC

## 2019-06-18 MED ORDER — LEVOFLOXACIN IN D5W 750 MG/150ML IV SOLN
750.0000 mg | Freq: Once | INTRAVENOUS | Status: AC
  Administered 2019-06-18: 750 mg via INTRAVENOUS
  Filled 2019-06-18: qty 150

## 2019-06-18 MED ORDER — SODIUM CHLORIDE 0.9 % IV BOLUS
500.0000 mL | Freq: Once | INTRAVENOUS | Status: AC
  Administered 2019-06-18: 16:00:00 500 mL via INTRAVENOUS

## 2019-06-18 MED ORDER — SODIUM CHLORIDE 0.9 % IV SOLN
INTRAVENOUS | Status: DC | PRN
  Administered 2019-06-18: 14:00:00 250 mL via INTRAVENOUS

## 2019-06-18 NOTE — Discharge Instructions (Addendum)
Take first dose of antibiotics tomorrow morning (take once daily), make sure you take this with food on your stomach, you can take a probiotic daily to help prevent diarrhea.  Continue using cough medication and inhaler.  If symptoms or not improving over the next 2 to 3 days contact your PCP or return to the emergency department if you start having high fevers, worsening cough, shortness of breath, chest pain or any other new or concerning symptoms return to the ED.

## 2019-06-18 NOTE — ED Provider Notes (Signed)
[facility] EMERGENCY DEPARTMENT Provider Note   CSN: [id number] Arrival date & time: 06/18/19  1240     History Chief Complaint  Patient presents with  . Cough    Douglas Whitney is a 49 y.o. male.  Douglas Whitney is a 49 y.o. male with a history of hypertension, GERD, seasonal allergies, who presents to the emergency department due to worsening productive cough and fatigue.  Patient was seen in the emergency department on 12/28 and was diagnosed with a right upper lobe pneumonia.  He was prescribed Augmentin, but states that he was unable to tolerate this antibiotic due to severe diarrhea, prior to this he was initially diagnosed with bronchitis and treated with a Z-Pak and prednisone, he contacted his PCP regarding Augmentin prescription and was prescribed a longer course of azithromycin for his pneumonia.  He presents today due to persistent cough with thick green foul tasting mucus as well as fatigue.  He has had some low-grade fevers up to 100 F, chills and fatigue.  He has had some occasional right-sided chest discomfort, had CTA regarding this during recent ED visit which showed pneumonia but no evidence of PE.  Reports that when he is up walking around he does not experience much shortness of breath he has not had any lightheadedness or syncope.  No associated nausea or vomiting.  States he has been taking Delsym for cough with some improvement.  His wife became very concerned due to his persistent symptoms and contacted his PCP who who recommended evaluation in the ED for possible IV antibiotics.  Since diagnosis he has not been tested for Covid.  He denies any known sick contacts.        Past Medical History:  Diagnosis Date  . Acid reflux   . Hypertension   . Seasonal allergies   . Sinusitis   . Snores   . Wears glasses     Patient Active Problem List   Diagnosis Date Noted  . Paronychia of left little finger 05/05/2018  . HTN (hypertension) 05/05/2018    . Cholelithiasis with chronic cholecystitis 07/28/2015  . Deviated septum 04/14/2013    Past Surgical History:  Procedure Laterality Date  . CHOLECYSTECTOMY N/A 08/01/2015   Procedure: LAPAROSCOPIC CHOLECYSTECTOMY WITH INTRAOPERATIVE CHOLANGIOGRAM;  Surgeon: Armandina Gemma, MD;  Location: [facility];  Service: General;  Laterality: N/A;  . ESOPHAGOGASTRODUODENOSCOPY  2017  . FINGER AMPUTATION Left 1983   cut off tip index finger  . FRACTURE SURGERY    . I & D EXTREMITY Left 05/06/2018   Procedure: IRRIGATION AND DEBRIDEMENT SMALL FINGER;  Surgeon: Marchia Bond, MD;  Location: [facility];  Service: Orthopedics;  Laterality: Left;  . JOINT REPLACEMENT    . NASAL SEPTOPLASTY W/ TURBINOPLASTY Bilateral 04/14/2013   Procedure: NASAL SEPTOPLASTY BILATERAL INFERIOR TURBINATE REDUCTION ;  Surgeon: Jerrell Belfast, MD;  Location: [facility];  Service: ENT;  Laterality: Bilateral;  . ORIF FINGER FRACTURE Left 1984   middle  . TOTAL HIP ARTHROPLASTY Right 03/2016  . WISDOM TOOTH EXTRACTION         No family history on file.  Social History   Tobacco Use  . Smoking status: Former Smoker    Packs/day: 1.00    Years: 7.00    Pack years: 7.00    Types: Cigarettes    Quit date: 04/10/1993    Years since quitting: 26.2  . Smokeless tobacco: Former Systems developer    Types: Chew, Snuff    Quit date:  1999  Substance Use Topics  . Alcohol use: Yes    Comment: 05/05/2018 "maybe 2 drinks/year"  . Drug use: No    Home Medications Prior to Admission medications   Medication Sig Start Date End Date Taking? Authorizing Provider  levofloxacin (LEVAQUIN) 750 MG tablet Take 1 tablet (750 mg total) by mouth daily. X 7 days 06/18/19   Jacqlyn Larsen, PA-C  lisinopril-hydrochlorothiazide (PRINZIDE,ZESTORETIC) 10-12.5 MG tablet Take 1 tablet by mouth daily. 04/06/18   [provider]  meloxicam (MOBIC) 15 MG tablet Take 15 mg by mouth daily as needed for pain. 04/19/18   [provider]   methocarbamol (ROBAXIN) 500 MG tablet Take 500 mg by mouth 2 (two) times daily as needed for spasms. 04/19/18   [provider]  metoprolol succinate (TOPROL-XL) 100 MG 24 hr tablet Take 100 mg by mouth daily. 04/30/18   [provider]  Multiple Vitamin (MULTIVITAMIN WITH MINERALS) TABS tablet Take 1 tablet by mouth daily.    [provider]  omega-3 acid ethyl esters (LOVAZA) 1 G capsule Take 2 g by mouth daily.    [provider]  oxyCODONE (ROXICODONE) 5 MG immediate release tablet Take 1 tablet (5 mg total) by mouth every 4 (four) hours as needed for severe pain. 05/06/18   Marchia Bond, MD  sennosides-docusate sodium (SENOKOT-S) 8.6-50 MG tablet Take 2 tablets by mouth daily. 05/06/18   Marchia Bond, MD    Allergies    Patient has no known allergies.  Review of Systems   Review of Systems  Constitutional: Positive for fatigue. Negative for chills and fever.  HENT: Negative for congestion, rhinorrhea and sore throat.   Respiratory: Positive for cough. Negative for shortness of breath.   Cardiovascular: Negative for chest pain.  Gastrointestinal: Negative for abdominal pain, diarrhea and nausea.  Genitourinary: Negative for dysuria and frequency.  Musculoskeletal: Positive for myalgias.  Skin: Negative for color change and rash.  Neurological: Negative for headaches.    Physical Exam Updated Vital Signs BP 136/79   Pulse 74   Temp 98.8 F (37.1 C) (Oral)   Resp 18   Ht '6\' 4"'$  (1.93 m)   Wt 115.7 kg   SpO2 99%   BMI 31.04 kg/m   Physical Exam Vitals and nursing note reviewed.  Constitutional:      General: He is not in acute distress.    Appearance: Normal appearance. He is well-developed. He is not ill-appearing or diaphoretic.     Comments: Overall well-appearing and in no distress  HENT:     Head: Normocephalic and atraumatic.     Mouth/Throat:     Mouth: Mucous membranes are moist.     Pharynx: Oropharynx is clear.  Eyes:      General:        Right eye: No discharge.        Left eye: No discharge.  Cardiovascular:     Rate and Rhythm: Normal rate and regular rhythm.     Heart sounds: Normal heart sounds. No murmur. No friction rub. No gallop.   Pulmonary:     Effort: Pulmonary effort is normal. No respiratory distress.     Breath sounds: Rhonchi present. No wheezing or rales.     Comments: Respirations equal and unlabored, patient able to speak in full sentences, satting well on room air.  Lungs with rhonchi in the right upper lung fields but otherwise clear to auscultation with good air movement. Abdominal:     General: Bowel  sounds are normal. There is no distension.     Palpations: Abdomen is soft. There is no mass.     Tenderness: There is no abdominal tenderness. There is no guarding.     Comments: Abdomen soft, nondistended, nontender to palpation in all quadrants without guarding or peritoneal signs  Musculoskeletal:        General: No deformity.     Cervical back: Neck supple.  Skin:    General: Skin is warm and dry.     Capillary Refill: Capillary refill takes less than 2 seconds.  Neurological:     Mental Status: He is alert.     Coordination: Coordination normal.     Comments: Speech is clear, able to follow commands Moves extremities without ataxia, coordination intact  Psychiatric:        Mood and Affect: Mood normal.        Behavior: Behavior normal.     ED Results / Procedures / Treatments   Labs (all labs ordered are listed, but only abnormal results are displayed) Labs Reviewed  CBC WITH DIFFERENTIAL/PLATELET - Abnormal; Notable for the following components:      Result Value   WBC 14.8 (*)    Neutro Abs 11.3 (*)    All other components within normal limits  COMPREHENSIVE METABOLIC PANEL - Abnormal; Notable for the following components:   Chloride 95 (*)    AST 12 (*)    All other components within normal limits  SARS CORONAVIRUS 2 AG (30 MIN TAT)  CULTURE, BLOOD (ROUTINE  X 2)  CULTURE, BLOOD (ROUTINE X 2)  SARS CORONAVIRUS 2 (TAT 6-24 HRS)  LACTIC ACID, PLASMA    EKG EKG Interpretation  Date/Time:  Sunday June 18 2019 12:51:43 EST Ventricular Rate:  88 PR Interval:    QRS Duration: 92 QT Interval:  355 QTC Calculation: 430 R Axis:   70 Text Interpretation: Sinus rhythm Consider left atrial enlargement nonspecific T wave changes laterally seem new since Dec 2020 Confirmed by Sherwood Gambler [phone]) on 06/18/2019 1:40:30 PM   Radiology DG Chest Port 1 View  Result Date: 06/18/2019 CLINICAL DATA:  Pneumonia with progression of symptoms EXAM: PORTABLE CHEST 1 VIEW COMPARISON:  June 05, 2019 chest radiograph and chest CT FINDINGS: There remains airspace opacity in the periphery of the right mid lung. This area of opacity has a somewhat nodular configuration, measuring 6.5 x 5.6 cm. Lungs elsewhere are clear. Heart is borderline enlarged with pulmonary vascularity normal. No adenopathy. No bone lesions. IMPRESSION: Airspace opacity in the right mid lung persists. The appearance is consistent with pneumonia. Note that on prior CT, no associated mass could be appreciated. The appearance of the current infiltrate does warrant continued surveillance; conceivably, an underlying mass with surrounding pneumonitis could present in this manner. Lungs elsewhere clear.  Stable cardiac prominence.  No adenopathy. Followup PA and lateral chest radiographs recommended in 3-4 weeks following trial of antibiotic therapy to ensure resolution and exclude underlying malignancy. Electronically Signed   By: Lowella Grip III M.D.   On: 06/18/2019 13:37    Procedures Procedures (including critical care time)  Medications Ordered in ED Medications  levofloxacin (LEVAQUIN) IVPB 750 mg (0 mg Intravenous Stopped 06/18/19 1558)  sodium chloride 0.9 % bolus 500 mL ( Intravenous Stopped 06/18/19 1601)    ED Course  I have reviewed the triage vital signs and the nursing  notes.  Pertinent labs & imaging results that were available during my care of the patient were reviewed by  me and considered in my medical decision making (see chart for details).  Clinical Course as of Jun 18 1640  Sun Jan 10, 246  4820 49 year old male diagnosed with pneumonia on 12/28 presents with persistent cough, and fatigue.  He denies significant shortness of breath and has normal O2 sats on arrival, well-appearing and in no respiratory distress but does have rhonchi in the right upper lung fields.  Patient has been on multiple antibiotics but is unsure exactly what he is taken, waiting to clarify medication course with patient's wife.  Depending on antibiotics patient may have failed outpatient therapy at this time.  Will get labs and chest x-ray as well as Covid test.   [KF]  1330 Clarified with patient's wife when patient was seen by PCP before Christmas he was treated with a Z-Pak and prednisone, he was prescribed amoxicillin as well but was unable to tolerate this due to persistent diarrhea.  When patient was seen in the ED on 12/28 he was prescribed Augmentin but did not take this due to concern for recurrent diarrhea, called PCP and was placed on a longer 9-day course of azithromycin.   [KF]  1340 EKG shows normal sinus without concerning changes.  EKG 12-Lead [KF]  1340 Chest x-ray shows persistent pneumonia, I reviewed images myself and compared them with x-ray on 12/28 I do not see any worsening of pneumonia which is reassuring  DG Chest Port 1 View [KF]  1415 Rapid Covid is negative, reflexive PCR sent  SARS Coronavirus 2 Ag (30 min TAT) - Nasal Swab (BD Veritor Kit) [KF]  1437 Leukocytosis of 14.8, unchanged from recent ED visit  CBC with Differential(!) [KF]  1450 Lactic acid not elevated, reassuring and does not suggest systemic response from infection  Lactic acid, plasma [KF]  1500 No significant electrolyte derangements, normal renal and liver function  Comprehensive  metabolic panel(!) [KF]  1478 Patient is overall feeling well, he maintained his normal O2 saturations when ambulating without increased work of breathing, and his lab work shows continued pneumonia but not significant worsening of disease.  Given the antibiotics patient received I am concerned that the reason patient has not improved is that he is truly only received coverage for an atypical pneumonia, and given lobar appearance feel patient needs broader coverage, will start patient on Levaquin given his intolerance of amoxicillin.  I discussed this plan with patient and wife and they are both in agreement.  Will discharge home with strict follow-up and return precautions.   [KF]    Clinical Course User Index [KF] Janet Berlin   MDM Rules/Calculators/A&P                      49 year old male diagnosed with pneumonia on 12/28 has completed 2 courses of azithromycin but did not tolerate amoxicillin or Augmentin due to diarrhea, presents with persistent cough and fatigue.  Work-up shows persistent but not worsening pneumonia and lab work is overall reassuring.  Concerned that persistent symptoms due to incomplete antibiotic coverage, and think patient would benefit from Levaquin, without hypoxia or oxygen requirement and reassuring lab work do not feel patient will require admission to the hospital and I have had shared decision-making discussion with the patient regarding this and he would prefer to go home.  Had long discussion with patient about return precautions and expected course of disease, if he is not seeing improvement in his symptoms over the next 2 to 3 days he should return  for reevaluation.  Covid test was negative and PCR.  He also has blood cultures pending.  Patient and wife expressed understanding and agreement with this plan.  Discharged home in good condition.  Patient discussed with Dr. Ok Edwards who helped guide patient's care and is in agreement with plan.  Final Clinical  Impression(s) / ED Diagnoses Final diagnoses:  Community acquired pneumonia of right upper lobe of lung    Rx / DC Orders ED Discharge Orders         Ordered    levofloxacin (LEVAQUIN) 750 MG tablet  Daily     06/18/19 1623           Benedetto Goad [place], [place] 06/19/19 1643    Sherwood Gambler, MD 06/22/19 [phone]

## 2019-06-18 NOTE — ED Notes (Signed)
Pt on cardiac monitor and auto VS. O2 Stats at 93% with exertion.

## 2019-06-18 NOTE — ED Notes (Signed)
Pt also reports some discomfort with urination as well.

## 2019-06-18 NOTE — ED Triage Notes (Signed)
Pt reports he has recently been seen for cough and diagnosed with pneumonia. Pt reports "bad tasting mucus" with his cough, and fatigue. Pt reports that he has not been COvid tested.

## 2019-06-18 NOTE — ED Notes (Signed)
Pt wife made aware that pt is in room and the provider will call with update.

## 2019-06-25 LAB — CULTURE, BLOOD (ROUTINE X 2)
Culture: NO GROWTH
Culture: NO GROWTH
Special Requests: ADEQUATE
Special Requests: ADEQUATE

## 2019-07-06 ENCOUNTER — Emergency Department (HOSPITAL_BASED_OUTPATIENT_CLINIC_OR_DEPARTMENT_OTHER)
Admission: EM | Admit: 2019-07-06 | Discharge: 2019-07-06 | Disposition: A | Discharge status: Home / Self Care | Payer: BC Managed Care – PPO | Attending: Emergency Medicine | Admitting: Emergency Medicine

## 2019-07-06 ENCOUNTER — Emergency Department (HOSPITAL_BASED_OUTPATIENT_CLINIC_OR_DEPARTMENT_OTHER): Payer: BC Managed Care – PPO

## 2019-07-06 ENCOUNTER — Encounter (HOSPITAL_BASED_OUTPATIENT_CLINIC_OR_DEPARTMENT_OTHER): Payer: Self-pay

## 2019-07-06 ENCOUNTER — Other Ambulatory Visit: Payer: Self-pay

## 2019-07-06 DIAGNOSIS — R05 Cough: Secondary | ICD-10-CM | POA: Diagnosis not present

## 2019-07-06 DIAGNOSIS — Z87891 Personal history of nicotine dependence: Secondary | ICD-10-CM | POA: Insufficient documentation

## 2019-07-06 DIAGNOSIS — J90 Pleural effusion, not elsewhere classified: Secondary | ICD-10-CM

## 2019-07-06 DIAGNOSIS — I10 Essential (primary) hypertension: Secondary | ICD-10-CM | POA: Insufficient documentation

## 2019-07-06 DIAGNOSIS — R918 Other nonspecific abnormal finding of lung field: Secondary | ICD-10-CM | POA: Diagnosis not present

## 2019-07-06 DIAGNOSIS — R059 Cough, unspecified: Secondary | ICD-10-CM

## 2019-07-06 DIAGNOSIS — R0781 Pleurodynia: Secondary | ICD-10-CM

## 2019-07-06 DIAGNOSIS — R0789 Other chest pain: Secondary | ICD-10-CM | POA: Diagnosis present

## 2019-07-06 LAB — COMPREHENSIVE METABOLIC PANEL
ALT: 19 U/L (ref 0–44)
AST: 13 U/L — ABNORMAL LOW (ref 15–41)
Albumin: 3.5 g/dL (ref 3.5–5.0)
Alkaline Phosphatase: 52 U/L (ref 38–126)
Anion gap: 9 (ref 5–15)
BUN: 8 mg/dL (ref 6–20)
CO2: 26 mmol/L (ref 22–32)
Calcium: 8.6 mg/dL — ABNORMAL LOW (ref 8.9–10.3)
Chloride: 100 mmol/L (ref 98–111)
Creatinine, Ser: 0.79 mg/dL (ref 0.61–1.24)
GFR calc Af Amer: >60 mL/min (ref >60)
GFR calc non Af Amer: >60 mL/min (ref >60)
Glucose, Bld: 102 mg/dL — ABNORMAL HIGH (ref 70–99)
Potassium: 3.8 mmol/L (ref 3.5–5.1)
Sodium: 135 mmol/L (ref 135–145)
Total Bilirubin: 0.7 mg/dL (ref 0.3–1.2)
Total Protein: 7.2 g/dL (ref 6.5–8.1)

## 2019-07-06 LAB — CBC WITH DIFFERENTIAL/PLATELET
Abs Immature Granulocytes: 0.04 10*3/uL (ref 0.00–0.07)
Basophils Absolute: 0.1 10*3/uL (ref 0.0–0.1)
Basophils Relative: 1 %
Eosinophils Absolute: 1.2 10*3/uL — ABNORMAL HIGH (ref 0.0–0.5)
Eosinophils Relative: 10 %
HCT: 44 % (ref 39.0–52.0)
Hemoglobin: 14.4 g/dL (ref 13.0–17.0)
Immature Granulocytes: 0 %
Lymphocytes Relative: 17 %
Lymphs Abs: 2.2 10*3/uL (ref 0.7–4.0)
MCH: 31 pg (ref 26.0–34.0)
MCHC: 32.7 g/dL (ref 30.0–36.0)
MCV: 94.8 fL (ref 80.0–100.0)
Monocytes Absolute: 1.3 10*3/uL — ABNORMAL HIGH (ref 0.1–1.0)
Monocytes Relative: 10 %
Neutro Abs: 8.1 10*3/uL — ABNORMAL HIGH (ref 1.7–7.7)
Neutrophils Relative %: 62 %
Platelets: 335 10*3/uL (ref 150–400)
RBC: 4.64 MIL/uL (ref 4.22–5.81)
RDW: 12.2 % (ref 11.5–15.5)
WBC: 13 10*3/uL — ABNORMAL HIGH (ref 4.0–10.5)
nRBC: 0 % (ref 0.0–0.2)

## 2019-07-06 LAB — CBG MONITORING, ED: Glucose-Capillary: 93 mg/dL (ref 70–99)

## 2019-07-06 LAB — TROPONIN I (HIGH SENSITIVITY): Troponin I (High Sensitivity): 2 ng/L (ref <18)

## 2019-07-06 MED ORDER — PREDNISONE 20 MG PO TABS: 40.0000 mg | ORAL_TABLET | Freq: Every day | ORAL | 0 refills | Status: DC

## 2019-07-06 MED ORDER — BENZONATATE 100 MG PO CAPS: 100.0000 mg | ORAL_CAPSULE | Freq: Three times a day (TID) | ORAL | 0 refills | Status: DC

## 2019-07-06 NOTE — ED Triage Notes (Signed)
Pt arrives to ED with reports of CP and cough that has been continued since the start of January. Pt reports increased pain yesterday, worsening this morning.

## 2019-07-06 NOTE — ED Provider Notes (Signed)
[facility] EMERGENCY DEPARTMENT Provider Note   CSN: [id number] Arrival date & time: 07/06/19  1027     History Chief Complaint  Patient presents with  . Chest Pain    Douglas Whitney is a 49 y.o. male.  Patient presents to the emergency department with intermittent recurrent sharp right-sided chest pains which have been ongoing since the end of December.  Patient developed pneumonia 1 month ago in the right middle lobe.  He was on a course of azithromycin as well as Augmentin and was transitioned to Levaquin.  Overall in early January symptoms improved and he was working.  He has had times where he has been very fatigued and the fatigue has been worse since the beginning of the week.  He has continued to cough.  He has had some white to yellow phlegm.  He is due to follow-up with the pulmonologist tomorrow.  Denies any shortness of breath or pain in the middle of the chest.  He had a coughing spell earlier today and was diaphoretic with this, otherwise no diaphoresis.  No exertional pain or shortness of breath.  No lower extremity swelling.  No abdominal pain, nausea, vomiting, or diarrhea.  He denies any current fevers.  Coronavirus testing was negative during work-up of current pneumonia.        Past Medical History:  Diagnosis Date  . Acid reflux   . Hypertension   . Seasonal allergies   . Sinusitis   . Snores   . Wears glasses     Patient Active Problem List   Diagnosis Date Noted  . Paronychia of left little finger 05/05/2018  . HTN (hypertension) 05/05/2018  . Cholelithiasis with chronic cholecystitis 07/28/2015  . Deviated septum 04/14/2013    Past Surgical History:  Procedure Laterality Date  . CHOLECYSTECTOMY N/A 08/01/2015   Procedure: LAPAROSCOPIC CHOLECYSTECTOMY WITH INTRAOPERATIVE CHOLANGIOGRAM;  Surgeon: Darnell Level, MD;  Location: [facility] OR;  Service: General;  Laterality: N/A;  . ESOPHAGOGASTRODUODENOSCOPY  2017  . FINGER AMPUTATION Left 1983   cut off tip index finger  . FRACTURE SURGERY    . I & D EXTREMITY Left 05/06/2018   Procedure: IRRIGATION AND DEBRIDEMENT SMALL FINGER;  Surgeon: Teryl Lucy, MD;  Location: [facility] OR;  Service: Orthopedics;  Laterality: Left;  . JOINT REPLACEMENT    . NASAL SEPTOPLASTY W/ TURBINOPLASTY Bilateral 04/14/2013   Procedure: NASAL SEPTOPLASTY BILATERAL INFERIOR TURBINATE REDUCTION ;  Surgeon: Osborn Coho, MD;  Location: [facility];  Service: ENT;  Laterality: Bilateral;  . ORIF FINGER FRACTURE Left 1984   middle  . TOTAL HIP ARTHROPLASTY Right 03/2016  . WISDOM TOOTH EXTRACTION         No family history on file.  Social History   Tobacco Use  . Smoking status: Former Smoker    Packs/day: 1.00    Years: 7.00    Pack years: 7.00    Types: Cigarettes    Quit date: 04/10/1993    Years since quitting: 26.2  . Smokeless tobacco: Former Neurosurgeon    Types: Chew, Snuff    Quit date: 1999  Substance Use Topics  . Alcohol use: Yes    Comment: 05/05/2018 "maybe 2 drinks/year"  . Drug use: No    Home Medications Prior to Admission medications   Medication Sig Start Date End Date Taking? Authorizing Provider  levofloxacin (LEVAQUIN) 750 MG tablet Take 1 tablet (750 mg total) by mouth daily. X 7 days 06/18/19   Dartha Lodge, PA-C  lisinopril-hydrochlorothiazide (PRINZIDE,ZESTORETIC) 10-12.5 MG tablet Take 1 tablet by mouth daily. 04/06/18   [provider]  meloxicam (MOBIC) 15 MG tablet Take 15 mg by mouth daily as needed for pain. 04/19/18   [provider]  methocarbamol (ROBAXIN) 500 MG tablet Take 500 mg by mouth 2 (two) times daily as needed for spasms. 04/19/18   [provider]  metoprolol succinate (TOPROL-XL) 100 MG 24 hr tablet Take 100 mg by mouth daily. 04/30/18   [provider]  Multiple Vitamin (MULTIVITAMIN WITH MINERALS) TABS tablet Take 1 tablet by mouth daily.    [provider]  omega-3 acid ethyl esters (LOVAZA)  1 G capsule Take 2 g by mouth daily.    [provider]  oxyCODONE (ROXICODONE) 5 MG immediate release tablet Take 1 tablet (5 mg total) by mouth every 4 (four) hours as needed for severe pain. 05/06/18   Marchia Bond, MD  sennosides-docusate sodium (SENOKOT-S) 8.6-50 MG tablet Take 2 tablets by mouth daily. 05/06/18   Marchia Bond, MD    Allergies    Patient has no known allergies.  Review of Systems   Review of Systems  Constitutional: Positive for diaphoresis (With coughing) and fatigue. Negative for fever.  Eyes: Negative for redness.  Respiratory: Positive for cough. Negative for shortness of breath.   Cardiovascular: Positive for chest pain. Negative for palpitations and leg swelling.  Gastrointestinal: Negative for abdominal pain, nausea and vomiting.  Genitourinary: Negative for dysuria.  Musculoskeletal: Negative for back pain and neck pain.  Skin: Negative for rash.  Neurological: Negative for syncope and light-headedness.  Psychiatric/Behavioral: The patient is not nervous/anxious.     Physical Exam Updated Vital Signs BP 118/81 (BP Location: Left Arm)   Pulse 75   Temp 98.3 F (36.8 C) (Oral)   Resp 15   Ht 6\' 4"  (1.93 m)   Wt 116.6 kg   SpO2 97%   BMI 31.28 kg/m   Physical Exam Vitals and nursing note reviewed.  Constitutional:      Appearance: He is well-developed.  HENT:     Head: Normocephalic and atraumatic.  Eyes:     General:        Right eye: No discharge.        Left eye: No discharge.     Conjunctiva/sclera: Conjunctivae normal.  Cardiovascular:     Rate and Rhythm: Normal rate and regular rhythm.     Heart sounds: Normal heart sounds.  Pulmonary:     Effort: Pulmonary effort is normal.     Breath sounds: Examination of the right-middle field reveals decreased breath sounds. Examination of the right-lower field reveals decreased breath sounds. Decreased breath sounds present. No wheezing, rhonchi or rales.  Abdominal:      Palpations: Abdomen is soft.     Tenderness: There is no abdominal tenderness. There is no guarding or rebound.  Musculoskeletal:     Cervical back: Normal range of motion and neck supple.     Right lower leg: No tenderness. No edema.     Left lower leg: No tenderness. No edema.  Skin:    General: Skin is warm and dry.  Neurological:     Mental Status: He is alert.     ED Results / Procedures / Treatments   Labs (all labs ordered are listed, but only abnormal results are displayed) Labs Reviewed  CBC WITH DIFFERENTIAL/PLATELET - Abnormal; Notable for the following components:      Result Value   WBC 13.0 (*)  Neutro Abs 8.1 (*)    Monocytes Absolute 1.3 (*)    Eosinophils Absolute 1.2 (*)    All other components within normal limits  COMPREHENSIVE METABOLIC PANEL - Abnormal; Notable for the following components:   Glucose, Bld 102 (*)    Calcium 8.6 (*)    AST 13 (*)    All other components within normal limits  CBG MONITORING, ED  TROPONIN I (HIGH SENSITIVITY)    EKG EKG Interpretation  Date/Time:  Thursday July 06 2019 10:39:18 EST Ventricular Rate:  77 PR Interval:    QRS Duration: 94 QT Interval:  368 QTC Calculation: 417 R Axis:   47 Text Interpretation: Sinus rhythm Probable left atrial enlargement Baseline wander in lead(s) V2 V3 V4 V5 V6 improved t wave from prior 1/21 Confirmed by Meridee Score [phone]) on 07/06/2019 10:51:40 AM   Radiology DG Chest Portable 1 View  Result Date: 07/06/2019 CLINICAL DATA:  Pneumonia, continued pain and cough EXAM: PORTABLE CHEST 1 VIEW COMPARISON:  Chest radiographs, 06/17/2018, CT chest, 06/05/2019 FINDINGS: Cardiomegaly. Redemonstrated, rounded masslike opacity of the lateral inferior right upper lobe, which does appear slightly improved but significantly persistent compared to radiographs dated 06/18/2019 and CT dated 06/05/2019. Small right pleural effusion. The visualized skeletal structures are unremarkable.  IMPRESSION: 1. Redemonstrated, rounded masslike opacity of the lateral inferior right upper lobe, which does appear slightly improved but significantly persistent compared to radiographs dated 06/18/2019 and CT dated 06/05/2019. This remains somewhat concerning for underlying mass. Recommend additional radiographs in 3-4 weeks to ensure resolution, given that airspace disease may persist for greater than 6 weeks. 2.  Increased small right pleural effusion. 3.  Cardiomegaly. Electronically Signed   By: Lauralyn Primes M.D.   On: 07/06/2019 11:46    Procedures Procedures (including critical care time)  Medications Ordered in ED Medications - No data to display  ED Course  I have reviewed the triage vital signs and the nursing notes.  Pertinent labs & imaging results that were available during my care of the patient were reviewed by me and considered in my medical decision making (see chart for details).  Patient seen and examined. Work-up initiated.  EKG reviewed with Dr. Charm Barges.  Pain is likely related to infectious process/pleurisy.  Will perform cardiac work-up and repeat chest x-ray to assess for improvement versus worsening.  Strongly encouraged follow-up with pulmonologist as planned on 2/3.   Vital signs reviewed and are as follows: BP 118/81 (BP Location: Left Arm)   Pulse 75   Temp 98.3 F (36.8 C) (Oral)   Resp 15   Ht 6\' 4"  (1.93 m)   Wt 116.6 kg   SpO2 97%   BMI 31.28 kg/m   1:15 PM I called and spoke with Dr. , who is actually the physician who will see the patient next week, by telephone.  She states that patient may need repeat CT however she would like to wait and do this as an outpatient.  Given lack of fever, would prefer to hold on additional antibiotics at this time.  States that we can consider giving prednisone 40 mg x 5 days.  Otherwise, follow-up in the office next week as planned.  Patient updated on results.  We discussed recommendations and he strongly  encouraged to keep the appointment next week and he plans on doing so.  Otherwise we will discharged home.  Prescription for Tessalon given as well.  Encouraged return to emergency department with fever, worsening shortness of breath or trouble  breathing, new symptoms or other concerns.      MDM Rules/Calculators/A&P                      Patient with persistent right upper lobe infiltrate now with slightly worse pleural effusion on the right.  Patient has normal oxygen saturation.  Main complaint is pleuritic pain related to this area.  He has been treated for pneumonia and has not had much improvement.  Patient will need further work-up by pulmonology and they were contacted today.  Fortunately his doctor had already referred him and he will be seen next week.  Recommendations as above.  Return instructions as above.  Discussed with patient that he will likely need further evaluation as outpatient.  He understands.   Final Clinical Impression(s) / ED Diagnoses Final diagnoses:  Right upper lobe pulmonary infiltrate  Pleural effusion  Pleuritic chest pain  Cough    Rx / DC Orders ED Discharge Orders         Ordered    predniSONE (DELTASONE) 20 MG tablet  Daily     07/06/19 1306    benzonatate (TESSALON) 100 MG capsule  Every 8 hours     07/06/19 1306           Renne Crigler, PA-C 07/06/19 1317    Terrilee Files, MD 07/06/19 [phone]

## 2019-07-06 NOTE — Discharge Instructions (Signed)
Please read and follow all provided instructions.  Your diagnoses today include:  1. Right upper lobe pulmonary infiltrate   2. Pleural effusion   3. Pleuritic chest pain   4. Cough     Tests performed today include:  An EKG of your heart  A chest x-ray  Cardiac enzymes - a blood test for heart muscle damage  Blood counts and electrolytes  Chest x-ray which shows some development of fluid at the base of the right lung as well as persistent pneumonia in the right lung  Vital signs. See below for your results today.   Medications prescribed:   Tessalon Perles - cough suppressant medication   Prednisone - steroid medicine   It is best to take this medication in the morning to prevent sleeping problems. If you are diabetic, monitor your blood sugar closely and stop taking Prednisone if blood sugar is over 300. Take with food to prevent stomach upset.   Take any prescribed medications only as directed.  Follow-up instructions: Please follow-up with Dr. Everardo All as planned next week. She does not want any additional antibiotics at this time. You may need another CT scan in a couple of weeks, but they will order this if needed.   Return instructions:  SEEK IMMEDIATE MEDICAL ATTENTION IF:  You have severe chest pain, especially if the pain is crushing or pressure-like and spreads to the arms, back, neck, or jaw, or if you have sweating, nausea (feeling sick to your stomach), or shortness of breath. THIS IS AN EMERGENCY. Don't wait to see if the pain will go away. Get medical help at once. Call 911 or 0 (operator). DO NOT drive yourself to the hospital.   Your chest pain gets worse and does not go away with rest.   You have an attack of chest pain lasting longer than usual, despite rest and treatment with the medications your caregiver has prescribed.   You wake from sleep with chest pain or shortness of breath.  You feel dizzy or faint.  You have chest pain not typical of  your usual pain for which you originally saw your caregiver.   You have any other emergent concerns regarding your health.   Your vital signs today were: BP 111/66    Pulse 69    Temp 98.3 F (36.8 C) (Oral)    Resp (!) 21    Ht 6\' 4"  (1.93 m)    Wt 116.6 kg    SpO2 97%    BMI 31.28 kg/m  If your blood pressure (BP) was elevated above 135/85 this visit, please have this repeated by your doctor within one month. --------------

## 2019-07-07 ENCOUNTER — Institutional Professional Consult (permissible substitution): Payer: BC Managed Care – PPO | Admitting: Pulmonary Disease

## 2019-07-12 ENCOUNTER — Ambulatory Visit: Payer: BC Managed Care – PPO | Admitting: Pulmonary Disease

## 2019-07-12 ENCOUNTER — Institutional Professional Consult (permissible substitution): Payer: BC Managed Care – PPO | Admitting: Critical Care Medicine

## 2019-07-12 ENCOUNTER — Encounter: Payer: Self-pay | Admitting: Pulmonary Disease

## 2019-07-12 ENCOUNTER — Other Ambulatory Visit: Payer: Self-pay

## 2019-07-12 VITALS — BP 104/60 | HR 83 | Temp 97.3°F | Ht 76.0 in | Wt 267.2 lb

## 2019-07-12 DIAGNOSIS — R9389 Abnormal findings on diagnostic imaging of other specified body structures: Secondary | ICD-10-CM

## 2019-07-12 DIAGNOSIS — R091 Pleurisy: Secondary | ICD-10-CM

## 2019-07-12 DIAGNOSIS — J90 Pleural effusion, not elsewhere classified: Secondary | ICD-10-CM | POA: Diagnosis not present

## 2019-07-12 MED ORDER — PREDNISONE 20 MG PO TABS: 40.0000 mg | ORAL_TABLET | Freq: Every day | ORAL | 0 refills | Status: DC

## 2019-07-12 MED ORDER — HYDROCODONE-HOMATROPINE 5-1.5 MG/5ML PO SYRP: 5.0000 mL | ORAL_SOLUTION | ORAL | 0 refills | Status: DC | PRN

## 2019-07-12 NOTE — Patient Instructions (Addendum)
Cough, Pleurisy START prednisone 40 mg daily x 40 mg START hycodan cough syrup as needed  Abnormal chest imaging with persistent right upper lobe infiltrate ORDER HR CT Chest without contrast to be scheduled next week  Nausea As needed zofran STOP new herbal supplements as these can sometimes cause nausea   Pleurisy  Pleurisy, also called pleuritis, is irritation and swelling (inflammation) of the linings of the lungs. The linings of the lungs are called pleura. They cover the outside of the lungs and the inside of the chest wall. There is a small amount of fluid (pleural fluid) between the pleura that allows the lungs to move in and out smoothly when you breathe. Pleurisy causes the pleura to be rough and dry and to rub together when you breathe, which is painful. In some cases, pleurisy can cause pleural fluid to build up between the pleura (pleural effusion). What are the causes? Common causes of this condition include:  A lung infection caused by bacteria or a virus.  A blood clot that travels to the lung (pulmonary embolism).  Air leaking into the pleural space (pneumothorax).  Lung cancer or a lung tumor.  A chest injury.  Diseases that can cause lung inflammation. These include rheumatoid arthritis, lupus, sickle cell disease, inflammatory bowel disease, and pancreatitis.  Heart or chest surgery.  Lung damage from inhaling asbestos.  A lung reaction to certain medicines. Sometimes the cause is unknown. What are the signs or symptoms? Chest pain is the main symptom of this condition. The pain is usually on one side. Chest pain may start suddenly and be sharp or stabbing. It may become a constant dull ache. You may also feel pain in your back or shoulder. The pain may get worse when you cough, take deep breaths, or make sudden movements. Other symptoms may include:  Shortness of breath.  Noisy breathing (wheezing).  Cough.  Chills.  Fever. How is this  diagnosed? This condition may be diagnosed based on:  Your medical history.  Your symptoms.  A physical exam. Your health care provider will listen to your breathing with a stethoscope to check for a rough, rubbing sound (friction rub). If you have pleural effusion, your breathing sounds may be muffled.  Tests, such as: ? Blood tests to check for infections or diseases and to measure the oxygen in your blood. ? Imaging studies of your lungs. These may include a chest X-ray, ultrasound, MRI, or CT scan. ? A procedure to remove pleural fluid with a needle for testing (thoracentesis). How is this treated? Treatment for this condition depends on the cause. Pleurisy that was caused by a virus usually clears up within 2 weeks. Treatment for pleurisy may include:  NSAIDs to help relieve pain and swelling.  Antibiotic medicines, if your condition was caused by a bacterial infection.  Prescription pain or cough medicine.  Medicines to dissolve a blood clot, if your condition was caused by pulmonary embolism.  Removal of pleural fluid or air. Follow these instructions at home: Medicines  Take over-the-counter and prescription medicines only as told by your health care provider.  If you were prescribed an antibiotic, take it as told by your health care provider. Do not stop taking the antibiotic even if you start to feel better. Activity  Rest and return to your normal activities as told by your health care provider. Ask your health care provider what activities are safe for you.  Do not drive or use heavy machinery while taking prescription pain  medicine. General instructions   Monitor your pleurisy for any changes.  Take deep breaths often, even if it is painful. This can help prevent lung infection (pneumonia) and collapse of lung tissue (atelectasis).  When lying down, lie on your painful side. This may reduce pain.  Do not smoke. If you need help quitting, ask your health care  provider.  Keep all follow-up visits as told by your health care provider. This is important. Contact a health care provider if:  You have pain that: ? Gets worse. ? Does not get better with medicine. ? Lasts for more than 1 week.  You have a fever or chills.  Your cough or shortness of breath is not improving at home.  You cough up pus-like (purulent) secretions. Get help right away if:  Your lips, fingernails, or toenails darken or turn blue.  You cough up blood.  You have any of the following symptoms that get worse: ? Difficulty breathing. ? Shortness of breath. ? Wheezing.  You have pain that spreads into your neck, arms, or jaw.  You develop a rash.  You vomit.  You faint. Summary  Pleurisy is inflammation of the linings of the lungs (pleura).  Pleurisy causes pain that makes it difficult for you to breathe or cough.  Pleurisy is often caused by an underlying infection or disease.  Treatment of pleurisy depends on the cause, and it often includes medicines. This information is not intended to replace advice given to you by your health care provider. Make sure you discuss any questions you have with your health care provider. Document Revised: 05/07/2017 Document Reviewed: 02/17/2016 Elsevier Patient Education  2020 [employer].

## 2019-07-12 NOTE — Progress Notes (Signed)
Subjective:   PATIENT ID: Douglas Whitney GENDER: male DOB: 20-Oct-1970, MRN: [id number]   HPI  Chief Complaint  Patient presents with  . Consult    right side pain below right nipple hurts to breath, cough, move, abnormal cxr 1/28 , uses albuterol 2-3 times a day 2 puffs each time    Reason for Visit: New consult for shortness of breath  Douglas Whitney is a 49 year old male who presents for right sided chest pain/pleuritic pain that worsens with coughing, cold air, laying down. He has had multiple ED visits for this complaint that initially began as a sharp stabbing pain that prompted EMS call. He was diagnosed with pneumonia and treated with Augmentin but stopped after 3 days due to diarrhea so he was switched to 9 day course of azithromycin. He felt antibiotics largely improved his symptoms however returned again to the ED for right chest pain, no respiratory symptoms. The pain has evolved in the last month and is described as a dull aching pain. He has productive cough previously with green sputum but this has cleared up with prednisone that was advised by me when ED physician contacted our Pulmonary line. He is taking Lawyer which are not helping.   History of recurrent pneumonia since childhood. Usually has yearly respiratory infection that requires antibiotics. However this is the worst he has every felt.  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 Quit >30 years ago.  I have personally reviewed patient's past medical/family/social history, allergies, current medications.  Past Medical History:  Diagnosis Date  . Acid reflux   . Hypertension   . Seasonal allergies   . Sinusitis   . Snores   . Wears glasses      No family history on file.   Social History   Occupational History  . Not on file  Tobacco Use  . Smoking status: Former Smoker    Packs/day: 2.00    Years: 6.00     Pack years: 12.00    Types: Cigarettes    Start date: 61    Quit date: 1998    Years since quitting: 23.1  . Smokeless tobacco: Former Neurosurgeon    Types: Chew, Snuff    Quit date: 1999  Substance and Sexual Activity  . Alcohol use: Yes    Comment: 05/05/2018 "maybe 2 drinks/year"  . Drug use: No  . Sexual activity: Not on file    Allergies  Allergen Reactions  . Amoxicillin Diarrhea     Outpatient Medications Prior to Visit  Medication Sig Dispense Refill  . albuterol (VENTOLIN HFA) 108 (90 Base) MCG/ACT inhaler Inhale 1-2 puffs into the lungs every 6 (six) hours as needed for wheezing or shortness of breath.    . benzonatate (TESSALON) 100 MG capsule Take 1 capsule (100 mg total) by mouth every 8 (eight) hours. 15 capsule 0  . lisinopril-hydrochlorothiazide (PRINZIDE,ZESTORETIC) 10-12.5 MG tablet Take 1 tablet by mouth daily.  3  . metoprolol succinate (TOPROL-XL) 100 MG 24 hr tablet Take 100 mg by mouth daily.  3  . Multiple Vitamin (MULTIVITAMIN WITH MINERALS) TABS tablet Take 1 tablet by mouth daily.    . meloxicam (MOBIC) 15 MG tablet Take 15 mg by mouth daily as needed for pain.  2  . methocarbamol (ROBAXIN) 500 MG tablet Take 500 mg by mouth 2 (two) times daily as needed for spasms.  0  . omega-3 acid  ethyl esters (LOVAZA) 1 G capsule Take 2 g by mouth daily.    . predniSONE (DELTASONE) 20 MG tablet Take 2 tablets (40 mg total) by mouth daily. 10 tablet 0  . sennosides-docusate sodium (SENOKOT-S) 8.6-50 MG tablet Take 2 tablets by mouth daily. 30 tablet 1   No facility-administered medications prior to visit.    Review of Systems  Constitutional: Negative for chills, diaphoresis, fever, malaise/fatigue and weight loss.  HENT: Negative for congestion.   Respiratory: Positive for cough, sputum production and shortness of breath. Negative for hemoptysis and wheezing.   Cardiovascular: Positive for chest pain. Negative for palpitations and leg swelling.     Objective:    Vitals:   07/12/19 1120  BP: 104/60  Pulse: 83  Temp: (!) 97.3 F (36.3 C)  TempSrc: Temporal  SpO2: 96%  Weight: 267 lb 3.2 oz (121.2 kg)  Height: 6\' 4"  (1.93 m)   SpO2: 96 % O2 Device: None (Room air)  Physical Exam: General: Well-appearing, no acute distress HENT: [place], AT Eyes: EOMI, no scleral icterus Respiratory: Clear to auscultation bilaterally.  No crackles, wheezing or rales Cardiovascular: RRR, -M/R/G, no JVD Extremities:-Edema,-tenderness Neuro: AAO x4, CNII-XII grossly intact Skin: Intact, no rashes or bruising Psych: Normal mood, normal affect  Data Reviewed:  Imaging: CXR 07/06/19 - Persistent right upper lobe mass-like, small right pleural effusion  PFT: None on file  Right chest 07/08/19 07/12/19 No evidence of pleural effusion previously seen on right chest. Lung and diaphragm visualized. No acute abnormalities     Assessment & Plan:   Discussion: 49 year old male with negligible smoking history with history of annual bronchitis/pneumonia who presents for consult for persistent right upper lobe pulmonary infiltrate. Prior effusion noted has resolved. Chest imaging since 05/2019 reviewed with patient and wife. Addressed patient and wife's questions. Pending repeat chest CT findings, we discussed possible bronchoscopy to rule out endobronchial lesion and atypical infection. Risks and benefits of bronchoscopy were reviewed including infection, bleeding or pneumothorax. Patient wishes to proceed for CT findings remain abnormal.  Cough, Pleurisy START prednisone 40 mg daily x 40 mg START hycodan cough syrup as needed  Abnormal chest imaging with persistent right upper lobe infiltrate ORDER HR CT Chest without contrast to be scheduled next week  Nausea As needed zofran STOP new herbal supplements as these can sometimes cause nausea   Health Maintenance Immunization History  Administered Date(s) Administered  . Influenza Whole 02/20/2019   CT Lung Screen -  not qualified  Orders Placed This Encounter  Procedures  . CT Chest High Resolution    SS. [place] 02/22/2019 [employer]     Standing Status:   Future    Standing Expiration Date:   09/08/2020    Scheduling Instructions:     Please schedule this week per Dr. 11/08/2020    Order Specific Question:   Preferred imaging location?    Answer:   [place] CT - [facility]    Order Specific Question:   Radiology Contrast Protocol - do NOT remove file path    Answer:   [email or web address]   Meds ordered this encounter  Medications  . HYDROcodone-homatropine (HYCODAN) 5-1.5 MG/5ML syrup    Sig: Take 5 mLs by mouth every 4 (four) hours as needed for cough.    Dispense:  240 mL    Refill:  0  . predniSONE (DELTASONE) 20 MG tablet    Sig: Take 2 tablets (40 mg total) by mouth daily with breakfast.    Dispense:  10 tablet    Refill:  0    Return in about 1 month (around 08/09/2019) for or sooner if CT abnormal.  I have spent a total time of 45-minutes on the day of the appointment reviewing prior documentation, coordinating care and discussing medical diagnosis and plan with the patient/family. Imaging, labs and tests included in this note have been reviewed and interpreted independently by me.  [place], MD [facility] 07/12/2019 7:38 PM  Office Number [phone]

## 2019-07-14 ENCOUNTER — Encounter: Payer: Self-pay | Admitting: Pulmonary Disease

## 2019-07-14 ENCOUNTER — Other Ambulatory Visit: Payer: Self-pay

## 2019-07-14 ENCOUNTER — Ambulatory Visit (INDEPENDENT_AMBULATORY_CARE_PROVIDER_SITE_OTHER): Payer: BC Managed Care – PPO | Admitting: Pulmonary Disease

## 2019-07-14 DIAGNOSIS — R05 Cough: Secondary | ICD-10-CM

## 2019-07-14 DIAGNOSIS — R9389 Abnormal findings on diagnostic imaging of other specified body structures: Secondary | ICD-10-CM

## 2019-07-14 DIAGNOSIS — R059 Cough, unspecified: Secondary | ICD-10-CM

## 2019-07-14 MED ORDER — LEVOFLOXACIN 500 MG PO TABS: 500.0000 mg | ORAL_TABLET | Freq: Every day | ORAL | 0 refills | Status: DC

## 2019-07-14 NOTE — Assessment & Plan Note (Signed)
Plan: Levaquin course today When in office need to consider sputum cultures or bronchoscopy for further evaluation after high-resolution CT chest Patient complete high-resolution CT chest on 07/18/2019 as planned We will schedule follow-up next week to evaluate how patient is doing as well as discuss high-resolution CT chest results

## 2019-07-14 NOTE — Progress Notes (Signed)
Virtual Visit via Telephone Note  I connected with Douglas Whitney on 07/14/19 at  4:00 PM EST by telephone and verified that I am speaking with the correct person using two identifiers.  Location: Patient: Home Provider: Office [facility] - [address] [place], [address], [place], [place] [address]   I discussed the limitations, risks, security and privacy concerns of performing an evaluation and management service by telephone and the availability of in person appointments. I also discussed with the patient that there may be a patient responsible charge related to this service. The patient expressed understanding and agreed to proceed.  Patient consented to consult via telephone: Yes People present and their role in pt care: Pt   History of Present Illness:  49 year old male former smoker initially consulted with our practice on 07/12/2019 for evaluation of recurrent pneumonia as well as abnormal CT chest.  Past medical history: Hypertension, cholelithiasis Smoking history: Former smoker.  Quit 1998.  12-pack-year smoking history. Maintenance: None Patient of Dr. Everardo All  Chief complaint: Cough    49 year old male established with our practice on 07/12/2019 for evaluation of recurrent pneumonia/mass on CT.  After office visit on 07/12/2019 patient was prescribed prednisone as well as scheduled for a high-resolution CT chest.  CT is scheduled for 07/18/2019.  Patient and spouse contacted our office on 07/14/2019 reporting intermittent low-grade fevers as well as increased cough and congestion with discolored green sputum.  Patient continues to struggle with sleeping.  Patient has already been treated empirically with a 3-day course of Augmentin which was stopped due to diarrhea.  Patient then had 9 days of azithromycin.  Patient had been previously treated and improved symptomatically on a course of Levaquin prior to consult with our practice.  There are no sputum cultures on  file.   Observations/Objective:  06/18/2019-SARS-CoV-2-negative  07/06/2019-CBC with differential- eosinophils relative 10, eosinophils absolute 1.2  07/06/2019-chest x-ray-redemonstrated round-like masslike opacity of the lateral inferior right upper lobe which does appear slightly improved but significantly persistent compared to radiographs dated 06/18/2019 and CT dated on 06/05/2019.  06/05/2019-CTA chest-right upper lobe pneumonia with trace right pleural effusion and generalized airway thickening, negative for PE, recommending 3 to 4-week imaging follow-up after trial of antibiotic therapy  Social History   Tobacco Use  Smoking Status Former Smoker  . Packs/day: 2.00  . Years: 6.00  . Pack years: 12.00  . Types: Cigarettes  . Start date: 27  . Quit date: 55  . Years since quitting: 23.1  Smokeless Tobacco Former Neurosurgeon  . Types: Chew, Snuff  . Quit date: 1999   Immunization History  Administered Date(s) Administered  . Influenza Whole 02/20/2019    Assessment and Plan:  Abnormal chest CT Plan: Levaquin course today When in office need to consider sputum cultures or bronchoscopy for further evaluation after high-resolution CT chest Patient complete high-resolution CT chest on 07/18/2019 as planned We will schedule follow-up next week to evaluate how patient is doing as well as discuss high-resolution CT chest results  Cough Plan: Levaquin today Finish prednisone as outlined Continue use Hycodan cough syrup as needed Complete high-resolution CT chest as ordered   Follow Up Instructions:  Return in about 1 week (around 07/21/2019), or if symptoms worsen or fail to improve, for Follow up with Elisha Headland FNP-C, Follow up with Dr. Everardo All, After Chest CT.   I discussed the assessment and treatment plan with the patient. The patient was provided an opportunity to ask questions and all were answered.  The patient agreed with the plan and demonstrated an understanding of  the instructions.   The patient was advised to call back or seek an in-person evaluation if the symptoms worsen or if the condition fails to improve as anticipated.  I provided 24 minutes of non-face-to-face time during this encounter.   Lauraine Rinne, NP

## 2019-07-14 NOTE — Assessment & Plan Note (Signed)
Plan: Levaquin today Finish prednisone as outlined Continue use Hycodan cough syrup as needed Complete high-resolution CT chest as ordered

## 2019-07-14 NOTE — Patient Instructions (Addendum)
You were seen today by Coral Ceo, NP  for:   1. Abnormal chest CT  Levaquin 500mg  tablet >>> Take 1 500 mg tablet daily for the next 7 days >>> Take with food >>> start probiotic for good gut health >>>avoid rigorous exercise for next 2 weeks   Complete prednisone as ordered  Complete high-resolution CT chest as ordered  2. Cough  Can use Hycodan cough syrup as needed   Follow Up:    Return in about 1 week (around 07/21/2019), or if symptoms worsen or fail to improve, for Follow up with 09/18/2019 FNP-C, Follow up with Dr. Elisha Headland, After Chest CT.   Please do your part to reduce the spread of COVID-19:      Reduce your risk of any infection  and COVID19 by using the similar precautions used for avoiding the common cold or flu:  Everardo All Wash your hands often with soap and warm water for at least 20 seconds.  If soap and water are not readily available, use an alcohol-based hand sanitizer with at least 60% alcohol.  . If coughing or sneezing, cover your mouth and nose by coughing or sneezing into the elbow areas of your shirt or coat, into a tissue or into your sleeve (not your hands). Marland Kitchen A MASK when in public  . Avoid shaking hands with others and consider head nods or verbal greetings only. . Avoid touching your eyes, nose, or mouth with unwashed hands.  . Avoid close contact with people who are sick. . Avoid places or events with large numbers of people in one location, like concerts or sporting events. . If you have some symptoms but not all symptoms, continue to monitor at home and seek medical attention if your symptoms worsen. . If you are having a medical emergency, call 911.   ADDITIONAL HEALTHCARE OPTIONS FOR PATIENTS  [facility] / e-Visit: Drinda Butts         [facility]: [phone]  [phone] Urgent Care: [facility]                   [facility]: [facility]      It is flu season:   >>> Best ways to protect herself from the flu: Receive the yearly flu vaccine, practice good hand hygiene washing with soap and also using hand sanitizer when available, eat a nutritious meals, get adequate rest, hydrate appropriately   Please contact the office if your symptoms worsen or you have concerns that you are not improving.   Thank you for choosing [facility] for your healthcare, and for allowing [phone] to partner with you on your healthcare journey. I am thankful to be able to provide care to you today.   [place] FNP-C

## 2019-07-17 ENCOUNTER — Telehealth: Payer: Self-pay | Admitting: Pulmonary Disease

## 2019-07-17 NOTE — Telephone Encounter (Signed)
LMTCB x1 for pt.  

## 2019-07-18 ENCOUNTER — Ambulatory Visit (INDEPENDENT_AMBULATORY_CARE_PROVIDER_SITE_OTHER)
Admission: RE | Admit: 2019-07-18 | Discharge: 2019-07-18 | Disposition: A | Discharge status: Home / Self Care | Payer: BC Managed Care – PPO | Source: Ambulatory Visit | Attending: Pulmonary Disease | Admitting: Pulmonary Disease

## 2019-07-18 ENCOUNTER — Ambulatory Visit: Payer: BC Managed Care – PPO | Admitting: Pulmonary Disease

## 2019-07-18 ENCOUNTER — Encounter: Payer: Self-pay | Admitting: Pulmonary Disease

## 2019-07-18 ENCOUNTER — Other Ambulatory Visit: Payer: Self-pay

## 2019-07-18 VITALS — BP 122/68 | HR 103 | Temp 97.3°F | Ht 76.0 in | Wt 260.2 lb

## 2019-07-18 DIAGNOSIS — J90 Pleural effusion, not elsewhere classified: Secondary | ICD-10-CM

## 2019-07-18 DIAGNOSIS — J851 Abscess of lung with pneumonia: Secondary | ICD-10-CM | POA: Insufficient documentation

## 2019-07-18 MED ORDER — MOXIFLOXACIN HCL 400 MG PO TABS: 400.0000 mg | ORAL_TABLET | Freq: Every day | ORAL | 0 refills | Status: DC

## 2019-07-18 NOTE — Telephone Encounter (Signed)
ATC pt, no answer. Left message for pt to call back.  

## 2019-07-18 NOTE — Telephone Encounter (Signed)
I called and spoke with the pt's spouse  Pt just arriving to get his chest ct now  She is anxious and concerned to get the results of this asap  She reports that the pt has been coughing so much he thinks he may have pulled a muscle or injured his back somehow  Has severe pain "below the mass 4 ribs down"  Pt currently scheduled for in office visit with Dr Everardo All on 07/20/19 There is an opening right now on the schedule this afternoon if you wanted to get him added on today a little sooner? Please advise thanks

## 2019-07-18 NOTE — Telephone Encounter (Signed)
Spoke with the pt's spouse and scheduled appt for today at 4:15

## 2019-07-18 NOTE — Telephone Encounter (Signed)
Please go ahead and add him to the schedule for today. -JE

## 2019-07-18 NOTE — Patient Instructions (Addendum)
Right lung abscess START Moxifloxacin 400 mg x 28 days CONTINUE hycodan cough syrup as needed START ibuprofen 400 mg every 6 hours as needed START tylenol 500 mg every 6 hours as needed Follow-up in one month

## 2019-07-18 NOTE — Progress Notes (Signed)
Subjective:   PATIENT ID: Douglas Rainier GENDER: male DOB: 1970/10/13, MRN: [id number]   HPI  Chief Complaint  Patient presents with  . Follow-up    unable to sleep due to pain, muscle spasms, CT results    Reason for Visit: Follow-up abnormal CT  Douglas Whitney is a 49 year old male with history of recurrent pneumonias who initially presented for right sided chest pain/pleuritic pain that worsens with coughing, cold air, laying down. He was diagnosed with pneumonia and treated with Augmentin but stopped after 3 days due to diarrhea so he was switched to 9 day course of azithromycin.   On our last visit he prescribed short prednisone course with some improvement in his chest pain. However since completing the course he has had persistent chest pain. Only improves with hycodan. His cough is now productive with green-yellowish sputum that tastes sour and putrid. Reports low-grade fevers and chills.   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 Quit >30 years ago.  I have personally reviewed patient's past medical/family/social history/allergies/current medications.  Past Medical History:  Diagnosis Date  . Acid reflux   . Hypertension   . Seasonal allergies   . Sinusitis   . Snores   . Wears glasses       Outpatient Medications Prior to Visit  Medication Sig Dispense Refill  . albuterol (VENTOLIN HFA) 108 (90 Base) MCG/ACT inhaler Inhale 1-2 puffs into the lungs every 6 (six) hours as needed for wheezing or shortness of breath.    Marland Kitchen HYDROcodone-homatropine (HYCODAN) 5-1.5 MG/5ML syrup Take 5 mLs by mouth every 4 (four) hours as needed for cough. 240 mL 0  . levofloxacin (LEVAQUIN) 500 MG tablet Take 1 tablet (500 mg total) by mouth daily. 7 tablet 0  . lisinopril-hydrochlorothiazide (PRINZIDE,ZESTORETIC) 10-12.5 MG tablet Take 1 tablet by mouth daily.  3  . metoprolol succinate (TOPROL-XL)  100 MG 24 hr tablet Take 100 mg by mouth daily.  3  . Multiple Vitamin (MULTIVITAMIN WITH MINERALS) TABS tablet Take 1 tablet by mouth daily.    . benzonatate (TESSALON) 100 MG capsule Take 1 capsule (100 mg total) by mouth every 8 (eight) hours. (Patient not taking: Reported on 07/18/2019) 15 capsule 0  . predniSONE (DELTASONE) 20 MG tablet Take 2 tablets (40 mg total) by mouth daily with breakfast. 10 tablet 0   No facility-administered medications prior to visit.    Review of Systems  Constitutional: Negative for chills, diaphoresis, fever, malaise/fatigue and weight loss.  HENT: Negative for congestion.   Respiratory: Positive for cough and sputum production. Negative for hemoptysis, shortness of breath and wheezing.   Cardiovascular: Positive for chest pain. Negative for palpitations and leg swelling.     Objective:   Vitals:   07/18/19 1642  BP: 122/68  Pulse: (!) 103  Temp: (!) 97.3 F (36.3 C)  TempSrc: Temporal  SpO2: 99%  Weight: 260 lb 3.2 oz (118 kg)  Height: 6\' 4"  (1.93 m)   SpO2: 99 % O2 Device: None (Room air)  Physical Exam: General: Well-appearing, no acute distress HENT: [place], AT Eyes: EOMI, no scleral icterus Respiratory: Clear to auscultation bilaterally.  No crackles, wheezing or rales Cardiovascular: RRR, -M/R/Whitney, no JVD Extremities:-Edema,-tenderness Neuro: AAO x4, CNII-XII grossly intact Skin: Intact, no rashes or bruising Psych: Normal mood, normal affect  Physical Exam: General: Well-appearing, no acute distress HENT: [place], AT Eyes: EOMI, no scleral icterus Respiratory: Inspiratory rhonchi,  expiratory rattling in right upper lobe Cardiovascular: RRR, -M/R/Whitney, no JVD Neuro: AAO x4, CNII-XII grossly intact Skin: Intact, no rashes or bruising Psych: Normal mood, normal affect  Data Reviewed:  Imaging: CXR 07/06/19 - Persistent right upper lobe mass-like, small right pleural effusion CT Chest 07/18/19 - Evolution of prior right upper lobe  consolidation, now cavitary. Suspect lung abscess vs atypical pneumonia vs malignancy  PFT: None on file  Right chest [place] 07/12/19 No evidence of pleural effusion previously seen on right chest. Lung and diaphragm visualized. No acute abnormalities     Assessment & Plan:   Discussion: 49 year old male with negligible smoking history and recurrent pneumonias/bronchitis who presents for follow-up. CT chest concerning for lung abscess. I personally spoke with Dr. Ashley Murrain from radiology to discuss CT chest including discussing the differential of lung abscess, atypical infection and possible malignancy. Reviewed imaging and discussed medical management as noted below with patient and wife. We spent extensive time discussing patient's clinical course with antibiotic management and pain regimen. I addressed questions to the best of knowledge.  Right lung abscess START Moxifloxacin 400 mg x 28 days CONTINUE hycodan cough syrup as needed. Appropriate to refill in the future START ibuprofen 400 mg every 6 hours as needed START tylenol 500 mg every 6 hours as needed Follow-up in one month  Health Maintenance Immunization History  Administered Date(s) Administered  . Influenza Whole 02/20/2019   CT Lung Screen - not qualified  No orders of the defined types were placed in this encounter.  Meds ordered this encounter  Medications  . moxifloxacin (AVELOX) 400 MG tablet    Sig: Take 1 tablet (400 mg total) by mouth daily.    Dispense:  28 tablet    Refill:  0    Return in about 1 month (around 08/15/2019).  I have spent a total time of 54-minutes on the day of the appointment reviewing prior documentation, coordinating care and discussing medical diagnosis and plan with the patient/family. Imaging, labs and tests included in this note have been reviewed and interpreted independently by me.  Azlynn Mitnick Mechele Collin, MD [facility] 07/18/2019 4:49 PM  Office Number [phone]

## 2019-07-19 NOTE — Telephone Encounter (Signed)
Pt was seen in office on 07/18/2019 by Dr. Everardo All.

## 2019-07-19 NOTE — Telephone Encounter (Signed)
Updated wife and daughter-in-law on signs on signs of sepsis. Advised them I would have a low threshold for him to be seen in the ED if he has persistent fever refractory to OTC medications or has low blood pressure with SBP <90 or low oxygen <90%. Otherwise continue to push fluids and conservative management at home with prescribed antibiotics.  Mechele Collin, M.D. [facility] 07/19/2019 2:09 PM

## 2019-07-20 ENCOUNTER — Ambulatory Visit: Payer: BC Managed Care – PPO | Admitting: Pulmonary Disease

## 2019-07-24 ENCOUNTER — Other Ambulatory Visit: Payer: Self-pay | Admitting: Pulmonary Disease

## 2019-07-24 MED ORDER — BENZONATATE 100 MG PO CAPS: 100.0000 mg | ORAL_CAPSULE | Freq: Four times a day (QID) | ORAL | 1 refills | Status: DC | PRN

## 2019-07-24 MED ORDER — HYDROCODONE-HOMATROPINE 5-1.5 MG/5ML PO SYRP: 5.0000 mL | ORAL_SOLUTION | ORAL | 0 refills | Status: DC | PRN

## 2019-07-24 NOTE — Progress Notes (Signed)
Refilled hyocodan and prescribed tessalon perles for cough.

## 2019-07-24 NOTE — Telephone Encounter (Signed)
Dr. Ellison, please see pt's mychart message and advise on it for pt. 

## 2019-07-31 NOTE — Telephone Encounter (Signed)
Dr. Everardo All please advise on patient email:   Douglas Whitney has been doing really good for two days.  Last night he was up most of the night coughing.  This morning he was gagging and trying to throw up from the nasty tasting smell that he had at the beginning of the antibiotic.  After a few days on the antibiotic it went away but now it is back.  Douglas Whitney wants the doctor to know this.  Thank you, Amy Mayford Knife

## 2019-08-01 NOTE — Telephone Encounter (Signed)
I called patient. His productive cough is intermittent and seems to occur when he exerts himself. Symptoms relieved with tessalon perles and cough syrup. No fevers or chills. Overall feels better with antibiotics.  Discussed returning to work. I stressed protective face shield from inhaling excessive dust or debris. Also warned him that he may have more productive cough from exerting himself but this should be ok. Reassured patient that his lung abscess is not contagious.  Plan to see patient on 3/8 for check-up. Will schedule CT chest at that time with telephone follow-up to discuss results.  Mechele Collin, M.D. [facility] Medicine 08/01/2019 6:04 PM

## 2019-08-14 ENCOUNTER — Encounter: Payer: Self-pay | Admitting: Pulmonary Disease

## 2019-08-14 ENCOUNTER — Ambulatory Visit (INDEPENDENT_AMBULATORY_CARE_PROVIDER_SITE_OTHER): Payer: BC Managed Care – PPO | Admitting: Pulmonary Disease

## 2019-08-14 ENCOUNTER — Other Ambulatory Visit: Payer: Self-pay

## 2019-08-14 VITALS — BP 136/74 | HR 87 | Temp 97.8°F | Ht 76.0 in | Wt 271.0 lb

## 2019-08-14 DIAGNOSIS — J851 Abscess of lung with pneumonia: Secondary | ICD-10-CM

## 2019-08-14 LAB — BASIC METABOLIC PANEL
BUN: 13 mg/dL (ref 6–23)
CO2: 30 mEq/L (ref 19–32)
Calcium: 9.7 mg/dL (ref 8.4–10.5)
Chloride: 101 mEq/L (ref 96–112)
Creatinine, Ser: 0.96 mg/dL (ref 0.40–1.50)
GFR: 82.74 mL/min (ref >60.00)
Glucose, Bld: 90 mg/dL (ref 70–99)
Potassium: 3.7 mEq/L (ref 3.5–5.1)
Sodium: 138 mEq/L (ref 135–145)

## 2019-08-14 MED ORDER — ALBUTEROL SULFATE HFA 108 (90 BASE) MCG/ACT IN AERS: 1.0000 | INHALATION_SPRAY | Freq: Four times a day (QID) | RESPIRATORY_TRACT | 6 refills | Status: DC | PRN

## 2019-08-14 MED ORDER — MOXIFLOXACIN HCL 400 MG PO TABS: 400.0000 mg | ORAL_TABLET | Freq: Every day | ORAL | 0 refills | Status: DC

## 2019-08-14 NOTE — Progress Notes (Signed)
Subjective:   PATIENT ID: Douglas Whitney GENDER: male DOB: 12-Sep-1970, MRN: [id number]   HPI  Chief Complaint  Patient presents with  . Follow-up    last antibiotic taken today     Reason for Visit: Follow-up abnormal CT  Douglas Whitney is a 49 year old male with history of recurrent pneumonias who presents with follow-up for lung abscess.  He has completed 4 weeks of antibiotics. His symptoms overall has improved but still have mild productive cough with occasional foul yellowish colored sputum which is improved from the putrid green sputum. He no longer needs any cough syrup. Denies fevers, chills. He has resumed work and reports fatigue and dyspnea with heavy lifting and moderate exertion.  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 Quit >30 years ago.  Past Medical History:  Diagnosis Date  . Acid reflux   . Hypertension   . Seasonal allergies   . Sinusitis   . Snores   . Wears glasses       Outpatient Medications Prior to Visit  Medication Sig Dispense Refill  . benzonatate (TESSALON) 100 MG capsule Take 1 capsule (100 mg total) by mouth every 6 (six) hours as needed for cough. 30 capsule 1  . lisinopril-hydrochlorothiazide (PRINZIDE,ZESTORETIC) 10-12.5 MG tablet Take 1 tablet by mouth daily.  3  . metoprolol succinate (TOPROL-XL) 100 MG 24 hr tablet Take 100 mg by mouth daily.  3  . Multiple Vitamin (MULTIVITAMIN WITH MINERALS) TABS tablet Take 1 tablet by mouth daily.    Marland Kitchen albuterol (VENTOLIN HFA) 108 (90 Base) MCG/ACT inhaler Inhale 1-2 puffs into the lungs every 6 (six) hours as needed for wheezing or shortness of breath.    . levofloxacin (LEVAQUIN) 500 MG tablet Take 1 tablet (500 mg total) by mouth daily. 7 tablet 0  . moxifloxacin (AVELOX) 400 MG tablet Take 1 tablet (400 mg total) by mouth daily. 28 tablet 0  . HYDROcodone-homatropine (HYCODAN) 5-1.5 MG/5ML syrup Take 5  mLs by mouth every 4 (four) hours as needed for cough. (Patient not taking: Reported on 08/14/2019) 240 mL 0   No facility-administered medications prior to visit.    Review of Systems  Constitutional: Negative for chills, diaphoresis, fever, malaise/fatigue and weight loss.  HENT: Negative for congestion.   Respiratory: Positive for shortness of breath. Negative for cough, hemoptysis, sputum production and wheezing.   Cardiovascular: Negative for chest pain, palpitations and leg swelling.   Objective:   Vitals:   08/14/19 0933  BP: 136/74  Pulse: 87  Temp: 97.8 F (36.6 C)  TempSrc: Temporal  SpO2: 98%  Weight: 271 lb (122.9 kg)  Height: 6\' 4"  (1.93 m)   SpO2: 98 % O2 Device: None (Room air)  Physical Exam: General: Well-appearing, no acute distress HENT: [place], AT Eyes: EOMI, no scleral icterus Respiratory: RUL wheeze Cardiovascular: RRR, -M/R/G, no JVD GI: BS+, soft, nontender Neuro: AAO x4, CNII-XII grossly intact Skin: Intact, no rashes or bruising Psych: Normal mood, normal affect  Data Reviewed:  Imaging: CXR 07/06/19 - Persistent right upper lobe mass-like, small right pleural effusion CT Chest 07/18/19 - Evolution of prior right upper lobe consolidation, now cavitary. Suspect lung abscess vs atypical pneumonia vs malignancy  PFT: None on file  Assessment & Plan:   Discussion: 49 year-old male with negligible smoking history and recurrent pneumonias/bronchitis who presents for follow-up. Completed 4 weeks of Moxifloxacin with improvement of symptoms. Will obtain repeat CT  Chest for further evaluation. For his shortness of breath, discussed plan for albuterol for symptomatic relief. This is likely related to deconditioning.  Right lung abscess s/p 4 weeks antibiotics CONTINUE two additional weeks of antibiotics Order CT chest with contrast within the next 1-2 weeks  Shortness of breath - new problem START albuterol TWO puffs as needed every four hours for  shortness of breath  Follow-up with telephone visit after CT Chest. Depending on results, we may need to extend antibiotics and repeat chest imaging. Patient prefers follow-up with either me or NP Mack.  Health Maintenance Immunization History  Administered Date(s) Administered  . Influenza Whole 02/20/2019   CT Lung Screen - not qualified  Orders Placed This Encounter  Procedures  . CT Chest W Contrast    Please scheduled within the next 1-2 weeks    Standing Status:   Future    Standing Expiration Date:   10/13/2020    Order Specific Question:   ** REASON FOR EXAM (FREE TEXT)    Answer:   lung abscess    Order Specific Question:   If indicated for the ordered procedure, I authorize the administration of contrast media per Radiology protocol    Answer:   Yes    Order Specific Question:   Preferred imaging location?    Answer:   [place] [facility] - [facility]    Order Specific Question:   Radiology Contrast Protocol - do NOT remove file path    Answer:   \\[email or web address]\Radiant\CTProtocols.pdf  . Basic Metabolic Panel (BMET)    Standing Status:   Future    Standing Expiration Date:   08/13/2020   Meds ordered this encounter  Medications  . albuterol (VENTOLIN HFA) 108 (90 Base) MCG/ACT inhaler    Sig: Inhale 1-2 puffs into the lungs every 6 (six) hours as needed for wheezing or shortness of breath.    Dispense:  8 g    Refill:  6  . moxifloxacin (AVELOX) 400 MG tablet    Sig: Take 1 tablet (400 mg total) by mouth daily.    Dispense:  14 tablet    Refill:  0    Return in about 6 months (around 02/14/2020).  I have spent a total time of 31-minutes on the day of the appointment reviewing prior documentation, coordinating care and discussing medical diagnosis and plan with the patient/family. Imaging, labs and tests included in this note have been reviewed and interpreted independently by me.  Daejah Klebba Mechele Collin, MD [facility] 08/14/2019 10:12 AM  Office Number  [phone]

## 2019-08-14 NOTE — Patient Instructions (Signed)
Right lung abscess s/p 4 weeks antibiotics Shortness of breath CONTINUE two additional weeks of antibiotics Order CT chest with contrast within the next 1-2 weeks START albuterol TWO puffs as needed every four hours for shortness of breath  Follow-up with telephone visit after CT Chest. Depending on results, we may need to extend antibiotics and repeat chest imaging

## 2019-08-17 MED ORDER — BENZONATATE 100 MG PO CAPS: 100.0000 mg | ORAL_CAPSULE | Freq: Four times a day (QID) | ORAL | 1 refills | Status: DC | PRN

## 2019-08-17 NOTE — Telephone Encounter (Signed)
Dr. Everardo All, please see mychart message and advise if it is okay to refill med for pt.

## 2019-08-24 ENCOUNTER — Ambulatory Visit (INDEPENDENT_AMBULATORY_CARE_PROVIDER_SITE_OTHER)
Admission: RE | Admit: 2019-08-24 | Discharge: 2019-08-24 | Disposition: A | Discharge status: Home / Self Care | Payer: BC Managed Care – PPO | Source: Ambulatory Visit | Attending: Pulmonary Disease | Admitting: Pulmonary Disease

## 2019-08-24 ENCOUNTER — Other Ambulatory Visit: Payer: Self-pay

## 2019-08-24 DIAGNOSIS — J851 Abscess of lung with pneumonia: Secondary | ICD-10-CM | POA: Diagnosis not present

## 2019-08-24 MED ORDER — IOHEXOL 300 MG/ML  SOLN
80.0000 mL | Freq: Once | INTRAMUSCULAR | Status: AC | PRN
  Administered 2019-08-24: 80 mL via INTRAVENOUS

## 2019-08-24 NOTE — Telephone Encounter (Signed)
Dr Everardo All, pt wanted [place] to just let you know that he had his CT done today, thanks

## 2019-08-25 NOTE — Telephone Encounter (Signed)
Recall placed in pt chart for 6 month follow up.  Nothing further needed at this time- will close encounter.

## 2019-08-25 NOTE — Telephone Encounter (Signed)
Called and discussed CT results with patient. Near resolution of lung abscess. Advised him to complete the remainder of his antibiotics for total of 6 week course. OK to continue albuterol as needed.   Please schedule patient to follow-up with me in 6 months. We will order CT chest at that visit for complete resolution of his infection.   JE

## 2020-01-31 ENCOUNTER — Encounter: Payer: Self-pay | Admitting: Pulmonary Disease

## 2020-01-31 ENCOUNTER — Ambulatory Visit: Payer: BC Managed Care – PPO | Admitting: Pulmonary Disease

## 2020-01-31 ENCOUNTER — Other Ambulatory Visit: Payer: Self-pay

## 2020-01-31 VITALS — BP 122/74 | HR 87 | Temp 97.3°F | Ht 76.0 in | Wt 277.8 lb

## 2020-01-31 DIAGNOSIS — J851 Abscess of lung with pneumonia: Secondary | ICD-10-CM

## 2020-01-31 MED ORDER — MOXIFLOXACIN HCL 400 MG PO TABS: 400.0000 mg | ORAL_TABLET | Freq: Every day | ORAL | 0 refills | Status: DC

## 2020-01-31 NOTE — Progress Notes (Signed)
Subjective:   PATIENT ID: Douglas Whitney GENDER: male DOB: 07/15/1970, MRN: [id number]   HPI  Chief Complaint  Patient presents with  . Follow-up    pain on right side when takes deep breaths for the past month    Reason for Visit: Follow-up abnormal CT  Mr. Douglas Whitney is a 49 year old male with history of recurrent pneumonias and bronchitis who presents for follow-up for lung abscess  He has previously completed 6 weeks of antibiotics. He reports chronic cough with light yellow sputum production that improves with albuterol use. He states it is exacerbated by activity, changes in weather and dust and allergens. He reports very light wheezing and some chest tightness. Denies fevers and chills.  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 Quit >30 years ago.  Past Medical History:  Diagnosis Date  . Acid reflux   . Hypertension   . Seasonal allergies   . Sinusitis   . Snores   . Wears glasses       Outpatient Medications Prior to Visit  Medication Sig Dispense Refill  . albuterol (VENTOLIN HFA) 108 (90 Base) MCG/ACT inhaler Inhale 1-2 puffs into the lungs every 6 (six) hours as needed for wheezing or shortness of breath. 8 g 6  . benzonatate (TESSALON) 100 MG capsule Take 1 capsule (100 mg total) by mouth every 6 (six) hours as needed for cough. 30 capsule 1  . lisinopril-hydrochlorothiazide (PRINZIDE,ZESTORETIC) 10-12.5 MG tablet Take 1 tablet by mouth daily.  3  . metoprolol succinate (TOPROL-XL) 100 MG 24 hr tablet Take 100 mg by mouth daily.  3  . Multiple Vitamin (MULTIVITAMIN WITH MINERALS) TABS tablet Take 1 tablet by mouth daily.    Marland Kitchen moxifloxacin (AVELOX) 400 MG tablet Take 1 tablet (400 mg total) by mouth daily. (Patient not taking: Reported on 01/31/2020) 14 tablet 0   No facility-administered medications prior to visit.    Review of Systems  Constitutional: Negative for  chills, diaphoresis, fever, malaise/fatigue and weight loss.  HENT: Negative for congestion.   Respiratory: Positive for cough, sputum production and wheezing. Negative for hemoptysis and shortness of breath.   Cardiovascular: Positive for chest pain. Negative for palpitations and leg swelling.   Objective:   Vitals:   01/31/20 1641  BP: 122/74  Pulse: 87  Temp: (!) 97.3 F (36.3 C)  TempSrc: Other (Comment)  SpO2: 96%  Weight: 277 lb 12.8 oz (126 kg)  Height: 6\' 4"  (1.93 m)   SpO2: 96 % (room air) O2 Device: None (Room air)  Physical Exam: General: Well-appearing, no acute distress HENT: [place], AT Eyes: EOMI, no scleral icterus Respiratory: Minimal RUL wheeze Cardiovascular: RRR, -M/R/G, no JVD Extremities:-Edema,-tenderness Neuro: AAO x4, CNII-XII grossly intact Skin: Intact, no rashes or bruising Psych: Normal mood, normal affect  Data Reviewed:  Imaging: CXR 07/06/19 - Persistent right upper lobe mass-like, small right pleural effusion CT Chest 07/18/19 - Evolution of prior right upper lobe consolidation, now cavitary. Suspect lung abscess vs atypical pneumonia vs malignancy CT Chest 08/24/19 - Interval resoluation of RUL abscess CT Chest 02/07/20 - Near resolution of RUL consolidation with minimal scarring  PFT: None on file  Assessment & Plan:   Discussion: 49 year-old male with negligible smoking history and recurrent pneumonias/bronchitis who presents for follow-up. Completed 4 weeks of Moxifloxacin with improvement of symptoms. Will obtain repeat CT Chest for further evaluation. For his shortness of breath, discussed plan for  albuterol for symptomatic relief. This is likely related to deconditioning.  Chest pain, wheezing --START antibiotics for five days --We will arrange for pulmonary function tests  Hx lung abscess --CT Chest ordered and reviewed. Near resolution of prior abscess ADDENDUM 02/09/20: Patient updated on scan results. No further imaging  indicated  Health Maintenance Immunization History  Administered Date(s) Administered  . Influenza Whole 02/20/2019   CT Lung Screen - not qualified  Orders Placed This Encounter  Procedures  . CT Chest Wo Contrast    Chantel message DC Ins-State [employer] No labs rqd     Standing Status:   Future    Number of Occurrences:   1    Standing Expiration Date:   01/30/2021    Order Specific Question:   Preferred imaging location?    Answer:   [place] [facility] - [facility]    Order Specific Question:   Radiology Contrast Protocol - do NOT remove file path    Answer:   \\[email or web address]\Radiant\CTProtocols.pdf  . Pulmonary function test    Spirometry before and after bronchodilator    Standing Status:   Future    Standing Expiration Date:   01/30/2021    Order Specific Question:   Where should this test be performed?    Answer:   [facility]   Meds ordered this encounter  Medications  . moxifloxacin (AVELOX) 400 MG tablet    Sig: Take 1 tablet (400 mg total) by mouth daily.    Dispense:  5 tablet    Refill:  0    No follow-ups on file. After PFTs  I have spent a total time of 32-minutes on the day of the appointment reviewing prior documentation, coordinating care and discussing medical diagnosis and plan with the patient/family. Imaging, labs and tests included in this note have been reviewed and interpreted independently by me.  Isaura Schiller Mechele Collin, MD [facility] 01/31/2020 4:48 PM  Office Number [phone]

## 2020-01-31 NOTE — Patient Instructions (Signed)
Chest pain, wheezing --START antibiotics for five days --We will arrange for pulmonary function tests  Hx lung abscess --Obtain CT Chest

## 2020-02-06 ENCOUNTER — Encounter: Payer: Self-pay | Admitting: Nurse Practitioner

## 2020-02-07 ENCOUNTER — Other Ambulatory Visit: Payer: Self-pay

## 2020-02-07 ENCOUNTER — Ambulatory Visit (INDEPENDENT_AMBULATORY_CARE_PROVIDER_SITE_OTHER)
Admission: RE | Admit: 2020-02-07 | Discharge: 2020-02-07 | Disposition: A | Discharge status: Home / Self Care | Payer: BC Managed Care – PPO | Source: Ambulatory Visit | Attending: Pulmonary Disease | Admitting: Pulmonary Disease

## 2020-02-07 DIAGNOSIS — J851 Abscess of lung with pneumonia: Secondary | ICD-10-CM | POA: Diagnosis not present

## 2020-02-13 ENCOUNTER — Other Ambulatory Visit (HOSPITAL_COMMUNITY): Payer: BC Managed Care – PPO

## 2020-02-15 ENCOUNTER — Ambulatory Visit: Payer: BC Managed Care – PPO | Admitting: Pulmonary Disease

## 2020-02-15 ENCOUNTER — Telehealth: Payer: Self-pay | Admitting: Unknown Physician Specialty

## 2020-02-15 NOTE — Telephone Encounter (Signed)
Called to Discuss with patient about Covid symptoms and the use of the monoclonal antibody infusion for those with mild to moderate Covid symptoms and at a high risk of hospitalization.     Pt appears to qualify for this infusion due to co-morbid conditions and/or a member of an at-risk group in accordance with the FDA Emergency Use Authorization.    Unable to reach pt    

## 2020-03-12 ENCOUNTER — Ambulatory Visit: Payer: BC Managed Care – PPO | Admitting: Nurse Practitioner

## 2020-07-18 NOTE — Telephone Encounter (Signed)
Patient sent email  Do I need to make appointment to do a at home test for a C-pap machine. I struggle at night if I'm on my back.  Thank You, Douglas Whitney  [phone] 07-17-20  I let him know that I didn't see where one was ordered or he needed to schedule one. I did advise him to call and make an appointment with JE to discuss the issue.   Dr. Everardo All please advise if any further recommendations are needed

## 2020-07-19 ENCOUNTER — Telehealth: Payer: Self-pay | Admitting: *Deleted

## 2020-07-19 NOTE — Telephone Encounter (Signed)
-----   Message from Chi Mechele Collin, MD sent at 07/19/2020  8:20 AM EST ----- Regarding: Clinic or video visit per patient preference Need appointment to evaluate for sleep apnea

## 2020-07-19 NOTE — Telephone Encounter (Signed)
Called spoke with patient.  He is unavailable today but open for Monday  Scheduled video at 0945  Nothing further needed at this time

## 2020-07-22 ENCOUNTER — Telehealth (INDEPENDENT_AMBULATORY_CARE_PROVIDER_SITE_OTHER): Payer: Self-pay | Admitting: Pulmonary Disease

## 2020-07-22 ENCOUNTER — Encounter: Payer: Self-pay | Admitting: Pulmonary Disease

## 2020-07-22 DIAGNOSIS — R0683 Snoring: Secondary | ICD-10-CM | POA: Insufficient documentation

## 2020-07-22 DIAGNOSIS — R0681 Apnea, not elsewhere classified: Secondary | ICD-10-CM

## 2020-07-22 NOTE — Progress Notes (Addendum)
Virtual Visit via Video Note  I connected with [name] on 07/22/20 at  9:45 AM EST by a video enabled telemedicine application and verified that I am speaking with the correct person using two identifiers.  Location: Patient: Home Provider: [facility]   I discussed the limitations of evaluation and management by telemedicine and the availability of in person appointments. The patient expressed understanding and agreed to proceed.   As noted below: I discussed the assessment and treatment plan with the patient. The patient was provided an opportunity to ask questions and all were answered. The patient agreed with the plan and demonstrated an understanding of the instructions.   The patient was advised to call back or seek an in-person evaluation if the symptoms worsen or if the condition fails to improve as anticipated.  I provided 31 minutes of non-face-to-face time during this encounter.   Yaeli Hartung Mechele Collin, MD    Subjective:   PATIENT ID: [name] GENDER: male DOB: 1970-11-18, MRN: [id number]   HPI  Chief Complaint  Patient presents with  . Consult    Being told he holds his breath when sleeping on back. Feels rested when waking up. Snores when sleeping on back.     Reason for Visit: Concern for sleep apnea  Mr. Tollie Canada is a 50 year old male with negligible smoking history and prior right-sided lung abscess status post prolonged antibiotics who presents for evaluation for sleep apnea.  He presents with a new complaint concerning for sleep apnea.  His wife has told him that he has had episodes of snoring and not breathing when sleeping on his back. This can occur when he is laying flat or even when he is sleeping in a recliner. He denies excessive daytime sleepiness, falling asleep easily or fatigue. He will have episodes of awakening at night gasping nearly every day. Denies morning headaches or difficulty concentrating. 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   Results of the Epworth flowsheet 07/22/2020  Sitting and reading 0  Watching TV 0  Sitting, inactive in a public place (e.g. 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 Quit >30 years ago.  Past Medical History:  Diagnosis Date  . Acid reflux   . Hypertension   . Seasonal allergies   . Sinusitis   . Snores   . Wears glasses       Outpatient Medications Prior to Visit  Medication Sig Dispense Refill  . albuterol (VENTOLIN HFA) 108 (90 Base) MCG/ACT inhaler Inhale 1-2 puffs into the lungs every 6 (six) hours as needed for wheezing or shortness of breath. 8 g 6  . benzonatate (TESSALON) 100 MG capsule Take 1 capsule (100 mg total) by mouth every 6 (six) hours as needed for cough. 30 capsule 1  . lisinopril-hydrochlorothiazide (PRINZIDE,ZESTORETIC) 10-12.5 MG tablet Take 1 tablet by mouth daily.  3  . Multiple Vitamin (MULTIVITAMIN WITH MINERALS) TABS tablet Take 1 tablet by mouth daily.    . metoprolol  succinate (TOPROL-XL) 100 MG 24 hr tablet Take 100 mg by mouth daily. (Patient not taking: Reported on 07/22/2020)  3  . moxifloxacin (AVELOX) 400 MG tablet Take 1 tablet (400 mg total) by mouth daily. (Patient not taking: Reported on 07/22/2020) 5 tablet 0   No facility-administered medications prior to visit.    Review of Systems  Constitutional: Negative for chills, diaphoresis, fever, malaise/fatigue and weight loss.  HENT: Negative for congestion.   Respiratory: Negative for cough, hemoptysis, sputum production, shortness  of breath and wheezing.   Cardiovascular: Negative for chest pain, palpitations and leg swelling.   Objective:   There were no vitals filed for this visit.    Physical Exam: General: Well-appearing, no acute distress HENT: [place], AT, OP clear, MMM, overcrowded Eyes: EOMI, no scleral icterus Respiratory: Normal respiratory rate, no respiratory distress Neuro: AAO x4, CNII-XII grossly intact Psych: Normal mood, normal affect  Data Reviewed:  Imaging: CXR 07/06/19 - Persistent right upper lobe mass-like, small right pleural effusion CT Chest 07/18/19 - Evolution of prior right upper lobe consolidation, now cavitary. Suspect lung abscess vs atypical pneumonia vs malignancy CT Chest 08/24/19 - Interval resoluation of RUL abscess CT Chest 02/07/20 - Near resolution of RUL consolidation with minimal scarring  PFT: None on file  Imaging, labs and tests noted above have been reviewed independently by me.  Assessment & Plan:   Discussion: 50 year old male with negligible smoking history, prior right-sided lung abscess status post prolonged antibiotics, HTN who presents for evaluation for sleep apnea. Epworth score 0. STOP BANG >5 high risk. With witnessed episodes of apnea and elevated score on STOP BANG, he will need sleep study to rule out obstructive sleep apnea.   Snoring Witnessed apnea --ORDER home sleep study  Health Maintenance Immunization History  Administered Date(s) Administered  . Influenza Whole 02/20/2019   CT Lung Screen - not qualified  Orders Placed This Encounter  Procedures  . Home sleep test    Standing Status:   Future    Standing Expiration Date:   07/22/2021    Scheduling Instructions:     First Available    Order Specific Question:   Where should this test be performed:    Answer:   [facility]   No orders of the defined types were placed in this encounter.   Return in about 2 months (around 09/19/2020).  Makaela Cando Mechele Collin, MD [facility] 07/22/2020 9:51 AM  Office Number [phone]

## 2020-07-22 NOTE — Patient Instructions (Signed)
Snoring Witnessed apnea --ORDER home sleep study  Follow-up with me in 2 months or when sleep study available

## 2020-07-30 ENCOUNTER — Ambulatory Visit: Payer: BC Managed Care – PPO

## 2020-07-30 ENCOUNTER — Other Ambulatory Visit: Payer: Self-pay

## 2020-07-30 DIAGNOSIS — R0681 Apnea, not elsewhere classified: Secondary | ICD-10-CM

## 2020-07-31 ENCOUNTER — Telehealth: Payer: Self-pay | Admitting: Pulmonary Disease

## 2020-07-31 NOTE — Telephone Encounter (Signed)
Pt took home sleep machine yesterday to do sleep study.  When I tried to download report this morning there was only 59 minutes of data.  I called pt reschedule for another study & he states the pulse ox and nasal cannula were very uncomfortable & he wasn't going to be able to do it.  I asked him if he wanted to do an inlab study & he states he does not.  I told him I would send a message to Dr Everardo All to make aware.

## 2020-07-31 NOTE — Telephone Encounter (Signed)
Noted  

## 2021-01-10 ENCOUNTER — Other Ambulatory Visit: Payer: Self-pay

## 2021-01-10 ENCOUNTER — Ambulatory Visit: Payer: BC Managed Care – PPO | Admitting: Cardiology

## 2021-01-10 ENCOUNTER — Ambulatory Visit (INDEPENDENT_AMBULATORY_CARE_PROVIDER_SITE_OTHER): Payer: BC Managed Care – PPO

## 2021-01-10 ENCOUNTER — Encounter: Payer: Self-pay | Admitting: Cardiology

## 2021-01-10 VITALS — BP 140/70 | HR 71 | Ht 75.0 in | Wt 266.0 lb

## 2021-01-10 DIAGNOSIS — R002 Palpitations: Secondary | ICD-10-CM

## 2021-01-10 DIAGNOSIS — R42 Dizziness and giddiness: Secondary | ICD-10-CM

## 2021-01-10 DIAGNOSIS — I10 Essential (primary) hypertension: Secondary | ICD-10-CM

## 2021-01-10 MED ORDER — LISINOPRIL-HYDROCHLOROTHIAZIDE 10-12.5 MG PO TABS: 1.0000 | ORAL_TABLET | Freq: Every day | ORAL | 5 refills | Status: DC

## 2021-01-10 NOTE — Patient Instructions (Addendum)
Medication Instructions:    Take lisinopril-hydrochlorothiazide (PRINZIDE,ZESTORETIC) 10-12.5 MG tablet once a dayl  2.   STOP taking Metoprolol Succinate (Toprol-XL).  Lab Work: None ordered If you have labs (blood work) drawn today and your tests are completely normal, you will receive your results only by: MyChart Message (if you have MyChart) OR A paper copy in the mail If you have any lab test that is abnormal or we need to change your treatment, we will call you to review the results.   Testing/Procedures:  Your physician has recommended that you wear a Zio XT monitor for 2 weeks.   This monitor is a medical device that records the heart's electrical activity. Doctors most often use these monitors to diagnose arrhythmias. Arrhythmias are problems with the speed or rhythm of the heartbeat. The monitor is a small device applied to your chest. You can wear one while you do your normal daily activities. While wearing this monitor if you have any symptoms to push the button and record what you felt. Once you have worn this monitor for the period of time provider prescribed (Usually 14 days), you will return the monitor device in the postage paid box. Once it is returned they will download the data collected and provide [place] with a report which the provider will then review and we will call you with those results. Important tips:  Avoid showering during the first 24 hours of wearing the monitor. Avoid excessive sweating to help maximize wear time. Do not submerge the device, no hot tubs, and no swimming pools. Keep any lotions or oils away from the patch. After 24 hours you may shower with the patch on. Take brief showers with your back facing the shower head.  Do not remove patch once it has been placed because that will interrupt data and decrease adhesive wear time. Push the button when you have any symptoms and write down what you were feeling. Once you have completed wearing your  monitor, remove and place into box which has postage paid and place in your outgoing mailbox.  If for some reason you have misplaced your box then call our office and we can provide another box and/or mail it off for you.      Follow-Up: At [facility], you and your health needs are our priority.  As part of our continuing mission to provide you with exceptional heart care, we have created designated Provider Care Teams.  These Care Teams include your primary Cardiologist (physician) and Advanced Practice Providers (APPs -  Physician Assistants and Nurse Practitioners) who all work together to provide you with the care you need, when you need it.  We recommend signing up for the patient portal called "MyChart".  Sign up information is provided on this After Visit Summary.  MyChart is used to connect with patients for Virtual Visits (Telemedicine).  Patients are able to view lab/test results, encounter notes, upcoming appointments, etc.  Non-urgent messages can be sent to your provider as well.   To learn more about what you can do with MyChart, go to [email or web address].    Your next appointment:   6 week(s)  The format for your next appointment:   In Person  Provider:    Dr. Sarita Haver  Other Instructions

## 2021-01-10 NOTE — Progress Notes (Signed)
Cardiology Office Note:    Date:  01/10/2021   ID:  DRADEN COTTINGHAM, DOB 11-26-1970, MRN [id number]  PCP:  Aida Puffer, MD   [facility] Providers Cardiologist:  Debbe Odea, MD     Referring MD: Aida Puffer, MD   Chief Complaint  Patient presents with   New Patient (Initial Visit)    Referred by PCP for Family history. Meds reviewed verbally with patient.    Douglas Whitney is a 50 y.o. male who is being seen today for the evaluation of palpitations at the request of Aida Puffer, MD.   History of Present Illness:    Douglas Whitney is a 50 y.o. male with a hx of hypertension who presents due to palpitations and family history of A. fib.  Patient is worried since father has atrial fibrillation and wondering if he may have this.  States he rarely has palpitations maybe once or twice a month not associated with dizziness.  He denies chest pain or shortness of breath.  Has been on lisinopril, HCTZ combo for some time, recently started on Toprol-XL for BP control.  States taking only Toprol-XL.  Works outside a lot of the times in the heat, has dizziness with standing.  He feels well after he goes inside in an air conditioned environment.  Past Medical History:  Diagnosis Date   Acid reflux    Hypertension    Seasonal allergies    Sinusitis    Snores    Wears glasses     Past Surgical History:  Procedure Laterality Date   CHOLECYSTECTOMY N/A 08/01/2015   Procedure: LAPAROSCOPIC CHOLECYSTECTOMY WITH INTRAOPERATIVE CHOLANGIOGRAM;  Surgeon: Darnell Level, MD;  Location: [facility] OR;  Service: General;  Laterality: N/A;   ESOPHAGOGASTRODUODENOSCOPY  2017   FINGER AMPUTATION Left 1983   cut off tip index finger   FRACTURE SURGERY     I & D EXTREMITY Left 05/06/2018   Procedure: IRRIGATION AND DEBRIDEMENT SMALL FINGER;  Surgeon: Teryl Lucy, MD;  Location: [facility] OR;  Service: Orthopedics;  Laterality: Left;   JOINT REPLACEMENT     NASAL SEPTOPLASTY W/ TURBINOPLASTY  Bilateral 04/14/2013   Procedure: NASAL SEPTOPLASTY BILATERAL INFERIOR TURBINATE REDUCTION ;  Surgeon: Osborn Coho, MD;  Location: [facility];  Service: ENT;  Laterality: Bilateral;   ORIF FINGER FRACTURE Left 1984   middle   TOTAL HIP ARTHROPLASTY Right 03/2016   WISDOM TOOTH EXTRACTION      Current Medications: Current Meds  Medication Sig   ADVAIR DISKUS 100-50 MCG/ACT AEPB Inhale 1 puff into the lungs 2 (two) times daily.   albuterol (VENTOLIN HFA) 108 (90 Base) MCG/ACT inhaler Inhale 1-2 puffs into the lungs every 6 (six) hours as needed for wheezing or shortness of breath.   Multiple Vitamin (MULTIVITAMIN WITH MINERALS) TABS tablet Take 1 tablet by mouth daily.   [DISCONTINUED] lisinopril-hydrochlorothiazide (PRINZIDE,ZESTORETIC) 10-12.5 MG tablet Take 1 tablet by mouth daily.     Allergies:   Patient has no known allergies.   Social History   Socioeconomic History   Marital status: Married    Spouse name: Not on file   Number of children: Not on file   Years of education: Not on file   Highest education level: Not on file  Occupational History   Not on file  Tobacco Use   Smoking status: Former    Packs/day: 2.00    Years: 6.00    Pack years: 12.00    Types: Cigarettes    Start  date: 67    Quit date: 19    Years since quitting: 24.6   Smokeless tobacco: Former    Types: Chew, Snuff    Quit date: 1999  Vaping Use   Vaping Use: Never used  Substance and Sexual Activity   Alcohol use: Yes    Comment: 05/05/2018 "maybe 2 drinks/year"   Drug use: No   Sexual activity: Not on file  Other Topics Concern   Not on file  Social History Narrative   Not on file   Social Determinants of Health   Financial Resource Strain: Not on file  Food Insecurity: Not on file  Transportation Needs: Not on file  Physical Activity: Not on file  Stress: Not on file  Social Connections: Not on file     Family History: The patient's family history is not  on file.  ROS:   Please see the history of present illness.     All other systems reviewed and are negative.  EKGs/Labs/Other Studies Reviewed:    The following studies were reviewed today:   EKG:  EKG is  ordered today.  The ekg ordered today demonstrates normal sinus rhythm, possible old inferior infarct.  Recent Labs: No results found for requested labs within last 8760 hours.  Recent Lipid Panel No results found for: CHOL, TRIG, HDL, CHOLHDL, VLDL, LDLCALC, LDLDIRECT   Risk Assessment/Calculations:          Physical Exam:    VS:  BP 140/70 (BP Location: Left Arm, Patient Position: Sitting, Cuff Size: Large)   Pulse 71   Ht 6\' 3"  (1.905 m)   Wt 266 lb (120.7 kg)   SpO2 99%   BMI 33.25 kg/m     Wt Readings from Last 3 Encounters:  01/10/21 266 lb (120.7 kg)  01/31/20 277 lb 12.8 oz (126 kg)  08/14/19 271 lb (122.9 kg)     GEN:  Well nourished, well developed in no acute distress HEENT: Normal NECK: No JVD; No carotid bruits LYMPHATICS: No lymphadenopathy CARDIAC: RRR, no murmurs, rubs, gallops RESPIRATORY:  Clear to auscultation without rales, wheezing or rhonchi  ABDOMEN: Soft, non-tender, non-distended MUSCULOSKELETAL:  No edema; No deformity  SKIN: Warm and dry NEUROLOGIC:  Alert and oriented x 3 PSYCHIATRIC:  Normal affect   ASSESSMENT:    1. Primary hypertension   2. Palpitations   3. Dizziness    PLAN:    In order of problems listed above:  Hypertension, BP elevated today, states taking Toprol-XL only for BP control.  Advised patient to take lisinopril 10, HCTZ 12.5 mg daily combination pill.  Hold Toprol-XL.  Check BP at home and keep a log. Palpitations, family history of A. fib.  Place cardiac monitor to evaluate any arrhythmias such as A. fib. Dizziness associated with working in the heat.  Hydration, staying cool advised.  Follow-up after cardiac monitor.    Medication Adjustments/Labs and Tests Ordered: Current medicines are reviewed  at length with the patient today.  Concerns regarding medicines are outlined above.  Orders Placed This Encounter  Procedures   LONG TERM MONITOR (3-14 DAYS)   EKG 12-Lead   Meds ordered this encounter  Medications   lisinopril-hydrochlorothiazide (ZESTORETIC) 10-12.5 MG tablet    Sig: Take 1 tablet by mouth daily.    Dispense:  30 tablet    Refill:  5    Patient Instructions  Medication Instructions:    Take lisinopril-hydrochlorothiazide (PRINZIDE,ZESTORETIC) 10-12.5 MG tablet once a dayl  2.   STOP taking Metoprolol  Succinate (Toprol-XL).  Lab Work: None ordered If you have labs (blood work) drawn today and your tests are completely normal, you will receive your results only by: MyChart Message (if you have MyChart) OR A paper copy in the mail If you have any lab test that is abnormal or we need to change your treatment, we will call you to review the results.   Testing/Procedures:  Your physician has recommended that you wear a Zio XT monitor for 2 weeks.   This monitor is a medical device that records the heart's electrical activity. Doctors most often use these monitors to diagnose arrhythmias. Arrhythmias are problems with the speed or rhythm of the heartbeat. The monitor is a small device applied to your chest. You can wear one while you do your normal daily activities. While wearing this monitor if you have any symptoms to push the button and record what you felt. Once you have worn this monitor for the period of time provider prescribed (Usually 14 days), you will return the monitor device in the postage paid box. Once it is returned they will download the data collected and provide [place] with a report which the provider will then review and we will call you with those results. Important tips:  Avoid showering during the first 24 hours of wearing the monitor. Avoid excessive sweating to help maximize wear time. Do not submerge the device, no hot tubs, and no swimming  pools. Keep any lotions or oils away from the patch. After 24 hours you may shower with the patch on. Take brief showers with your back facing the shower head.  Do not remove patch once it has been placed because that will interrupt data and decrease adhesive wear time. Push the button when you have any symptoms and write down what you were feeling. Once you have completed wearing your monitor, remove and place into box which has postage paid and place in your outgoing mailbox.  If for some reason you have misplaced your box then call our office and we can provide another box and/or mail it off for you.      Follow-Up: At [facility], you and your health needs are our priority.  As part of our continuing mission to provide you with exceptional heart care, we have created designated Provider Care Teams.  These Care Teams include your primary Cardiologist (physician) and Advanced Practice Providers (APPs -  Physician Assistants and Nurse Practitioners) who all work together to provide you with the care you need, when you need it.  We recommend signing up for the patient portal called "MyChart".  Sign up information is provided on this After Visit Summary.  MyChart is used to connect with patients for Virtual Visits (Telemedicine).  Patients are able to view lab/test results, encounter notes, upcoming appointments, etc.  Non-urgent messages can be sent to your provider as well.   To learn more about what you can do with MyChart, go to [email or web address].    Your next appointment:   6 week(s)  The format for your next appointment:   In Person  Provider:    Dr. Sarita Haver  Other Instructions    Signed, Debbe Odea, MD  01/10/2021 12:23 PM    [facility]

## 2021-02-25 ENCOUNTER — Ambulatory Visit: Payer: BC Managed Care – PPO | Admitting: Cardiology

## 2021-03-04 ENCOUNTER — Ambulatory Visit: Payer: BC Managed Care – PPO | Admitting: Cardiology

## 2021-03-04 ENCOUNTER — Other Ambulatory Visit: Payer: Self-pay

## 2021-03-04 ENCOUNTER — Encounter: Payer: Self-pay | Admitting: Cardiology

## 2021-03-04 VITALS — BP 132/70 | HR 82 | Ht 76.0 in | Wt 271.4 lb

## 2021-03-04 DIAGNOSIS — R002 Palpitations: Secondary | ICD-10-CM

## 2021-03-04 DIAGNOSIS — I10 Essential (primary) hypertension: Secondary | ICD-10-CM

## 2021-03-04 NOTE — Patient Instructions (Signed)

## 2021-03-04 NOTE — Progress Notes (Signed)
Cardiology Office Note:    Date:  03/04/2021   ID:  Douglas Whitney, DOB 1970-08-24, MRN [id number]  PCP:  Aida Puffer, MD   [facility] Providers Cardiologist:  Debbe Odea, MD     Referring MD: Aida Puffer, MD   Chief Complaint  Patient presents with   Other    6 week follow up -- Meds reviewed verbally with patient.     History of Present Illness:    Douglas Whitney is a 50 y.o. male with a hx of hypertension who presents for follow-up.  Previously seen due to palpitations and family history of A. fib.    BP medications were adjusted, Toprol-XL was stopped, HCTZ and lisinopril combo was restarted.  Cardiac monitor also placed to evaluate any significant arrhythmias. He also had dizziness due to working outside in the heat.  Adequate hydration was advised.  Presents for cardiac monitor results.  Has been taking BP medications as prescribed.  BP seems controlled.  He has felt well since starting BP medication.  Denies any symptoms of chest pain or shortness of breath.  Symptoms of palpitations overall improved.   Past Medical History:  Diagnosis Date   Acid reflux    Hypertension    Seasonal allergies    Sinusitis    Snores    Wears glasses     Past Surgical History:  Procedure Laterality Date   CHOLECYSTECTOMY N/A 08/01/2015   Procedure: LAPAROSCOPIC CHOLECYSTECTOMY WITH INTRAOPERATIVE CHOLANGIOGRAM;  Surgeon: Darnell Level, MD;  Location: [facility] OR;  Service: General;  Laterality: N/A;   ESOPHAGOGASTRODUODENOSCOPY  2017   FINGER AMPUTATION Left 1983   cut off tip index finger   FRACTURE SURGERY     I & D EXTREMITY Left 05/06/2018   Procedure: IRRIGATION AND DEBRIDEMENT SMALL FINGER;  Surgeon: Teryl Lucy, MD;  Location: [facility] OR;  Service: Orthopedics;  Laterality: Left;   JOINT REPLACEMENT     NASAL SEPTOPLASTY W/ TURBINOPLASTY Bilateral 04/14/2013   Procedure: NASAL SEPTOPLASTY BILATERAL INFERIOR TURBINATE REDUCTION ;  Surgeon: Osborn Coho, MD;  Location:  [facility];  Service: ENT;  Laterality: Bilateral;   ORIF FINGER FRACTURE Left 1984   middle   TOTAL HIP ARTHROPLASTY Right 03/2016   WISDOM TOOTH EXTRACTION      Current Medications: Current Meds  Medication Sig   ADVAIR DISKUS 100-50 MCG/ACT AEPB Inhale 1 puff into the lungs 2 (two) times daily.   albuterol (VENTOLIN HFA) 108 (90 Base) MCG/ACT inhaler Inhale 1-2 puffs into the lungs every 6 (six) hours as needed for wheezing or shortness of breath.   lisinopril-hydrochlorothiazide (ZESTORETIC) 10-12.5 MG tablet Take 1 tablet by mouth daily.   Multiple Vitamin (MULTIVITAMIN WITH MINERALS) TABS tablet Take 1 tablet by mouth daily.     Allergies:   Patient has no known allergies.   Social History   Socioeconomic History   Marital status: Married    Spouse name: Not on file   Number of children: Not on file   Years of education: Not on file   Highest education level: Not on file  Occupational History   Not on file  Tobacco Use   Smoking status: Former    Packs/day: 2.00    Years: 6.00    Pack years: 12.00    Types: Cigarettes    Start date: 59    Quit date: 1998    Years since quitting: 24.7   Smokeless tobacco: Former    Types: Chew, Snuff    Quit  date: 1999  Vaping Use   Vaping Use: Never used  Substance and Sexual Activity   Alcohol use: Yes    Comment: 05/05/2018 "maybe 2 drinks/year"   Drug use: No   Sexual activity: Not on file  Other Topics Concern   Not on file  Social History Narrative   Not on file   Social Determinants of Health   Financial Resource Strain: Not on file  Food Insecurity: Not on file  Transportation Needs: Not on file  Physical Activity: Not on file  Stress: Not on file  Social Connections: Not on file     Family History: The patient's family history is not on file.  ROS:   Please see the history of present illness.     All other systems reviewed and are negative.  EKGs/Labs/Other Studies Reviewed:    The  following studies were reviewed today:   EKG:  EKG is  ordered today.  The ekg ordered today demonstrates normal sinus rhythm, possible old inferior infarct.  Recent Labs: No results found for requested labs within last 8760 hours.  Recent Lipid Panel No results found for: CHOL, TRIG, HDL, CHOLHDL, VLDL, LDLCALC, LDLDIRECT   Risk Assessment/Calculations:          Physical Exam:    VS:  BP 132/70 (BP Location: Left Arm, Patient Position: Sitting, Cuff Size: Large)   Pulse 82   Ht 6\' 4"  (1.93 m)   Wt 271 lb 6 oz (123.1 kg)   SpO2 96%   BMI 33.03 kg/m     Wt Readings from Last 3 Encounters:  03/04/21 271 lb 6 oz (123.1 kg)  01/10/21 266 lb (120.7 kg)  01/31/20 277 lb 12.8 oz (126 kg)     GEN:  Well nourished, well developed in no acute distress HEENT: Normal NECK: No JVD; No carotid bruits LYMPHATICS: No lymphadenopathy CARDIAC: RRR, no murmurs, rubs, gallops RESPIRATORY:  Clear to auscultation without rales, wheezing or rhonchi  ABDOMEN: Soft, non-tender, non-distended MUSCULOSKELETAL:  No edema; No deformity  SKIN: Warm and dry NEUROLOGIC:  Alert and oriented x 3 PSYCHIATRIC:  Normal affect   ASSESSMENT:    1. Primary hypertension   2. Palpitations     PLAN:    In order of problems listed above:  Hypertension, BP controlled since starting lisinopril/HCTZ combo.  Continue medication as prescribed, low-salt diet advised. Palpitations, family history of A. fib.  Cardiac monitor with no evidence for atrial fibrillation.  Patient made aware of results, reassured.  Symptoms of palpitations improved.  Follow-up as needed   Medication Adjustments/Labs and Tests Ordered: Current medicines are reviewed at length with the patient today.  Concerns regarding medicines are outlined above.  No orders of the defined types were placed in this encounter.  No orders of the defined types were placed in this encounter.   Patient Instructions  Medication Instructions:   Your physician recommends that you continue on your current medications as directed. Please refer to the Current Medication list given to you today.  *If you need a refill on your cardiac medications before your next appointment, please call your pharmacy*   Lab Work: None ordered If you have labs (blood work) drawn today and your tests are completely normal, you will receive your results only by: MyChart Message (if you have MyChart) OR A paper copy in the mail If you have any lab test that is abnormal or we need to change your treatment, we will call you to review the results.  Testing/Procedures: None ordered   Follow-Up: At [facility], you and your health needs are our priority.  As part of our continuing mission to provide you with exceptional heart care, we have created designated Provider Care Teams.  These Care Teams include your primary Cardiologist (physician) and Advanced Practice Providers (APPs -  Physician Assistants and Nurse Practitioners) who all work together to provide you with the care you need, when you need it.  We recommend signing up for the patient portal called "MyChart".  Sign up information is provided on this After Visit Summary.  MyChart is used to connect with patients for Virtual Visits (Telemedicine).  Patients are able to view lab/test results, encounter notes, upcoming appointments, etc.  Non-urgent messages can be sent to your provider as well.   To learn more about what you can do with MyChart, go to [email or web address].    Your next appointment:   Follow up as needed   The format for your next appointment:   In Person  Provider:   Debbe Odea, MD   Other Instructions    Signed, Debbe Odea, MD  03/04/2021 10:25 AM    [facility]

## 2021-04-29 IMAGING — DX DG CHEST 1V PORT
1 series · 1 of 1 positions shown · non-contrast
Comparison: June 05, 2019 chest radiograph and chest CT

CLINICAL DATA: Pneumonia with progression of symptoms

EXAM:
PORTABLE CHEST 1 VIEW

[chest ap]
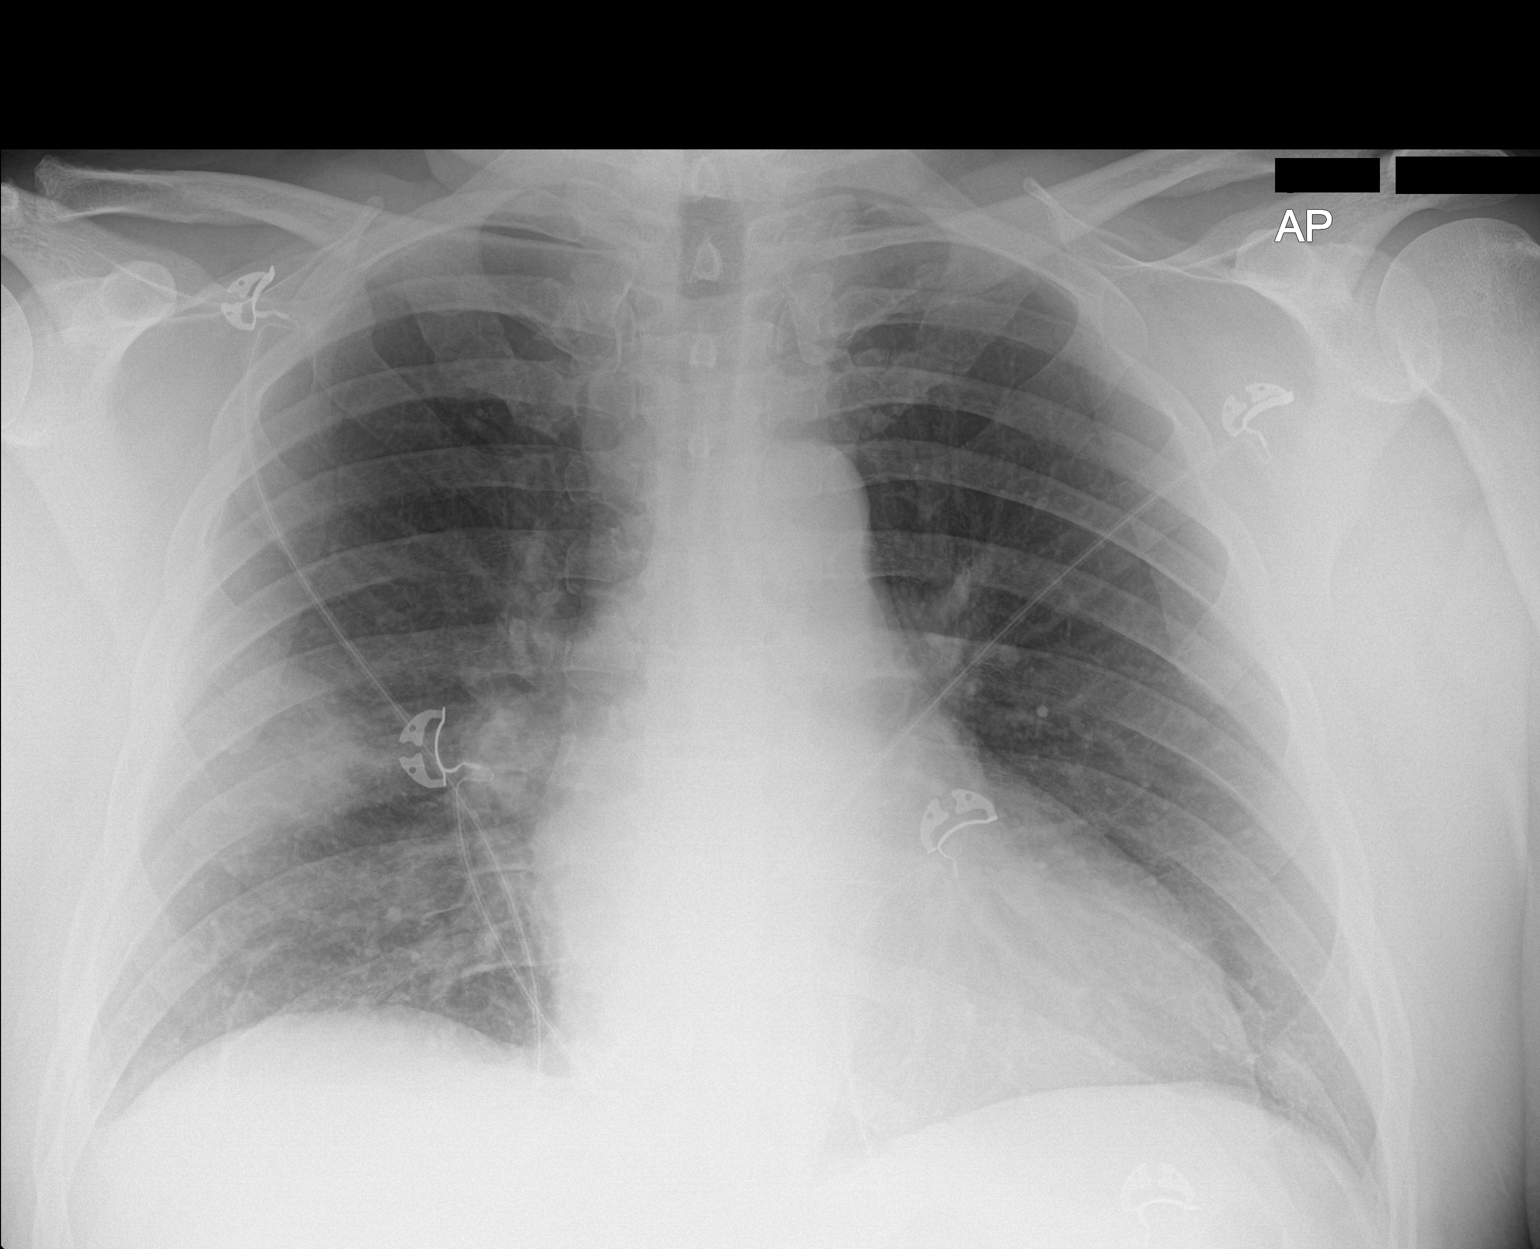

[1 of 1 positions shown; findings below may reference images not displayed]

FINDINGS: There remains airspace opacity in the periphery of the right mid
lung. This area of opacity has a somewhat nodular configuration,
measuring 6.5 x 5.6 cm. Lungs elsewhere are clear. Heart is
borderline enlarged with pulmonary vascularity normal. No
adenopathy. No bone lesions.
IMPRESSION: Airspace opacity in the right mid lung persists. The appearance is
consistent with pneumonia. Note that on prior CT, no associated mass
could be appreciated. The appearance of the current infiltrate does
warrant continued surveillance; conceivably, an underlying mass with
surrounding pneumonitis could present in this manner.

Lungs elsewhere clear.  Stable cardiac prominence.  No adenopathy.

Followup PA and lateral chest radiographs recommended in 3-4 weeks
following trial of antibiotic therapy to ensure resolution and
exclude underlying malignancy.

## 2021-05-17 IMAGING — DX DG CHEST 1V PORT
1 series · 1 of 1 positions shown · non-contrast
Comparison: Chest radiographs, 06/17/2018, CT chest, 06/05/2019

CLINICAL DATA: Pneumonia, continued pain and cough

EXAM:
PORTABLE CHEST 1 VIEW

[chest ap]
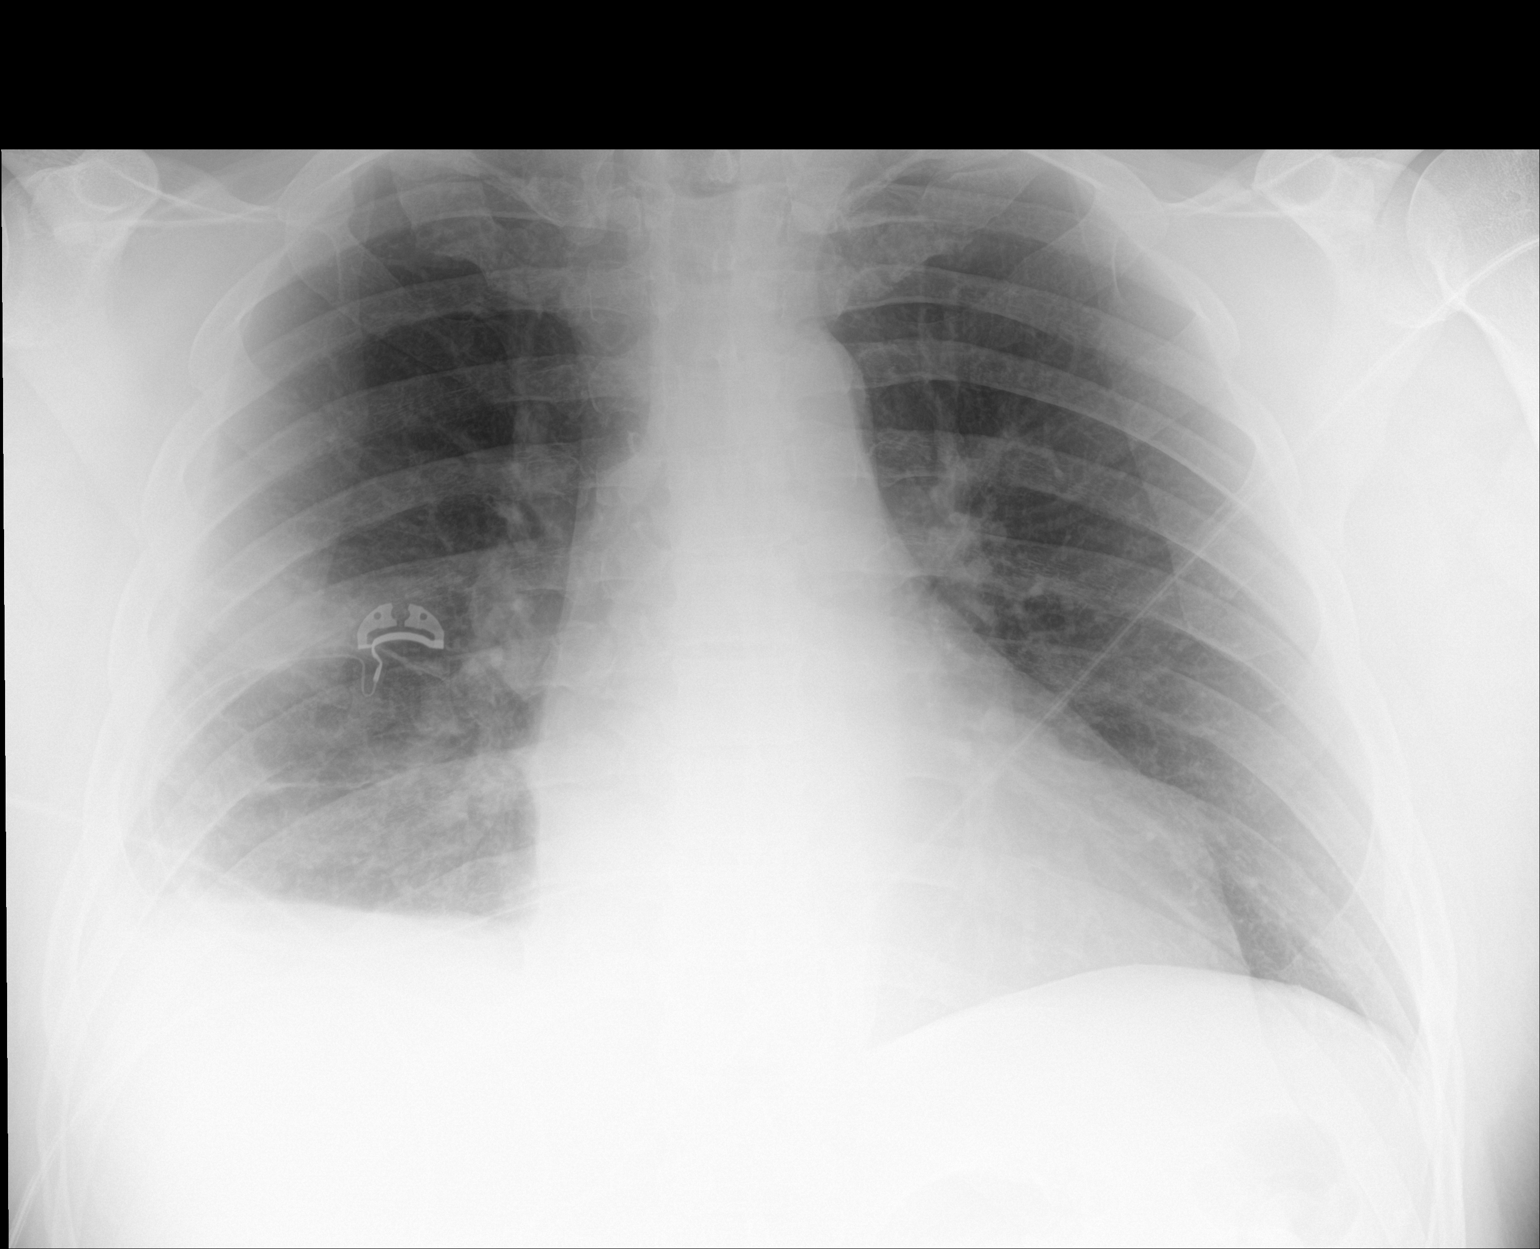

[1 of 1 positions shown; findings below may reference images not displayed]

FINDINGS: Cardiomegaly. Redemonstrated, rounded masslike opacity of the
lateral inferior right upper lobe, which does appear slightly
improved but significantly persistent compared to radiographs dated
06/18/2019 and CT dated 06/05/2019. Small right pleural effusion.
The visualized skeletal structures are unremarkable.
IMPRESSION: 1. Redemonstrated, rounded masslike opacity of the lateral inferior
right upper lobe, which does appear slightly improved but
significantly persistent compared to radiographs dated 06/18/2019
and CT dated 06/05/2019. This remains somewhat concerning for
underlying mass. Recommend additional radiographs in 3-4 weeks to
ensure resolution, given that airspace disease may persist for
greater than 6 weeks.

2.  Increased small right pleural effusion.

3.  Cardiomegaly.

## 2021-06-15 ENCOUNTER — Encounter: Payer: Self-pay | Admitting: Nurse Practitioner

## 2021-06-16 ENCOUNTER — Other Ambulatory Visit: Payer: Self-pay

## 2021-06-16 ENCOUNTER — Encounter: Payer: Self-pay | Admitting: Nurse Practitioner

## 2021-06-16 ENCOUNTER — Ambulatory Visit: Payer: BC Managed Care – PPO | Admitting: Nurse Practitioner

## 2021-06-16 VITALS — BP 153/72 | HR 82 | Temp 98.2°F | Ht 76.0 in | Wt 270.4 lb

## 2021-06-16 DIAGNOSIS — J4520 Mild intermittent asthma, uncomplicated: Secondary | ICD-10-CM

## 2021-06-16 DIAGNOSIS — I10 Essential (primary) hypertension: Secondary | ICD-10-CM

## 2021-06-16 DIAGNOSIS — Z0000 Encounter for general adult medical examination without abnormal findings: Secondary | ICD-10-CM

## 2021-06-16 DIAGNOSIS — R7989 Other specified abnormal findings of blood chemistry: Secondary | ICD-10-CM

## 2021-06-16 DIAGNOSIS — Z1211 Encounter for screening for malignant neoplasm of colon: Secondary | ICD-10-CM

## 2021-06-16 DIAGNOSIS — Z7689 Persons encountering health services in other specified circumstances: Secondary | ICD-10-CM

## 2021-06-16 DIAGNOSIS — Z6832 Body mass index (BMI) 32.0-32.9, adult: Secondary | ICD-10-CM | POA: Insufficient documentation

## 2021-06-16 DIAGNOSIS — Z6833 Body mass index (BMI) 33.0-33.9, adult: Secondary | ICD-10-CM | POA: Insufficient documentation

## 2021-06-16 MED ORDER — LISINOPRIL-HYDROCHLOROTHIAZIDE 10-12.5 MG PO TABS: 1.0000 | ORAL_TABLET | Freq: Every day | ORAL | 0 refills | Status: DC

## 2021-06-16 MED ORDER — ADVAIR DISKUS 100-50 MCG/ACT IN AEPB: 1.0000 | INHALATION_SPRAY | Freq: Two times a day (BID) | RESPIRATORY_TRACT | 1 refills | Status: DC

## 2021-06-16 MED ORDER — ALBUTEROL SULFATE HFA 108 (90 BASE) MCG/ACT IN AERS: 1.0000 | INHALATION_SPRAY | Freq: Four times a day (QID) | RESPIRATORY_TRACT | 1 refills | Status: DC | PRN

## 2021-06-16 NOTE — Progress Notes (Signed)
New Patient Office Visit  Subjective:  Patient ID: Douglas Whitney, male    DOB: June 12, 1970  Age: 51 y.o. MRN: [id number]  CC:  Chief Complaint  Patient presents with   New Patient (Initial Visit)    HPI Douglas Whitney presents to establish new primary care provider. He is coming from Dr. Rex Kras, who has recently retired. States that he did have a CPE in the last 12 months. The patient does have history of hypertension. His blood pressure is elevated today. He states that he did not take his blood pressure medication today. States that he does take testosterone injections weekly.  He in unsure of when he last had testosterone levels and other labs checked. He administers his own testosterone injections. 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 He is a non smoker. The patient denies chest pain, chest pressure, or unusual shortness of breath.   Past Medical History:  Diagnosis Date   Acid reflux    Hypertension    Seasonal allergies    Sinusitis    Snores    Wears glasses     Past Surgical History:  Procedure Laterality Date   CHOLECYSTECTOMY N/A 08/01/2015   Procedure: LAPAROSCOPIC CHOLECYSTECTOMY WITH INTRAOPERATIVE CHOLANGIOGRAM;  Surgeon: Armandina Gemma, MD;  Location: [facility];  Service: General;  Laterality: N/A;   ESOPHAGOGASTRODUODENOSCOPY  2017   FINGER AMPUTATION Left 1983   cut off tip index finger   FRACTURE SURGERY     I & D EXTREMITY Left 05/06/2018   Procedure: IRRIGATION AND DEBRIDEMENT SMALL FINGER;  Surgeon: Marchia Bond, MD;  Location: [facility];  Service: Orthopedics;  Laterality: Left;   JOINT REPLACEMENT     NASAL SEPTOPLASTY W/ TURBINOPLASTY Bilateral 04/14/2013   Procedure: NASAL SEPTOPLASTY BILATERAL INFERIOR TURBINATE REDUCTION ;  Surgeon: Jerrell Belfast, MD;  Location: [facility];  Service: ENT;  Laterality: Bilateral;   ORIF FINGER FRACTURE Left 1984   middle   TOTAL HIP ARTHROPLASTY Right 03/2016    WISDOM TOOTH EXTRACTION      Family History  Problem Relation Age of Onset   High blood pressure Father    Stroke Other     Social History   Socioeconomic History   Marital status: Married    Spouse name: Not on file   Number of children: Not on file   Years of education: Not on file   Highest education level: Not on file  Occupational History   Not on file  Tobacco Use   Smoking status: Former    Packs/day: 2.00    Years: 6.00    Pack years: 12.00    Types: Cigarettes    Start date: 40    Quit date: 1998    Years since quitting: 25.0   Smokeless tobacco: Former    Types: Chew, Snuff    Quit date: 1999  Vaping Use   Vaping Use: Never used  Substance and Sexual Activity   Alcohol use: Never    Comment: 05/05/2018 "maybe 2 drinks/year"   Drug use: No   Sexual activity: Yes    Partners: Female  Other Topics Concern   Not on file  Social History Narrative   Not on file   Social Determinants of Health   Financial Resource Strain: Not on file  Food Insecurity: Not on file  Transportation Needs: Not on file  Physical Activity: Not on file  Stress: Not on file  Social Connections: Not on file  Intimate Partner Violence: Not on file    ROS Review of Systems  Constitutional:  Negative for activity change, chills, fatigue and fever.  HENT:  Negative for congestion, postnasal drip, rhinorrhea, sinus pressure, sinus pain, sneezing and sore throat.   Eyes: Negative.   Respiratory:  Negative for cough, shortness of breath and wheezing.   Cardiovascular:  Negative for chest pain and palpitations.       Elevated blood pressure  has not taken blood pressure medication.   Gastrointestinal:  Negative for constipation, diarrhea, nausea and vomiting.  Endocrine: Negative for cold intolerance, heat intolerance, polydipsia and polyuria.       History of low testosterone.   Genitourinary:  Negative for dysuria, frequency and urgency.  Musculoskeletal:  Negative for back  pain and myalgias.  Skin:  Negative for rash.  Allergic/Immunologic: Negative for environmental allergies.  Neurological:  Negative for dizziness, weakness and headaches.  Psychiatric/Behavioral:  The patient is not nervous/anxious.    Objective:   Today's Vitals   06/16/21 0841  BP: (!) 153/72  Pulse: 82  Temp: 98.2 F (36.8 C)  SpO2: 97%  Weight: 270 lb 6.4 oz (122.7 kg)  Height: 6\' 4"  (1.93 m)   Body mass index is 32.91 kg/m.   Physical Exam Vitals and nursing note reviewed.  Constitutional:      Appearance: Normal appearance. He is well-developed.  HENT:     Head: Normocephalic and atraumatic.  Eyes:     Pupils: Pupils are equal, round, and reactive to light.  Cardiovascular:     Rate and Rhythm: Normal rate and regular rhythm.     Pulses: Normal pulses.     Heart sounds: Normal heart sounds.  Pulmonary:     Effort: Pulmonary effort is normal.     Breath sounds: Normal breath sounds.  Abdominal:     Palpations: Abdomen is soft.  Musculoskeletal:        General: Normal range of motion.     Cervical back: Normal range of motion and neck supple.  Lymphadenopathy:     Cervical: No cervical adenopathy.  Skin:    General: Skin is warm and dry.     Capillary Refill: Capillary refill takes less than 2 seconds.  Neurological:     General: No focal deficit present.     Mental Status: He is alert and oriented to person, place, and time.  Psychiatric:        Mood and Affect: Mood normal.        Behavior: Behavior normal.        Thought Content: Thought content normal.        Judgment: Judgment normal.    Assessment & Plan:  1. Encounter to establish care Appointment today to establish new primary care provider. Will get progress notes from previous provider to review and update chart.   2. Essential hypertension New prescription for blood pressure medication sent to his pharmacy today. Will recheck BP when comes for lab appointment in next few days.  -  lisinopril-hydrochlorothiazide (ZESTORETIC) 10-12.5 MG tablet; Take 1 tablet by mouth daily.  Dispense: 90 tablet; Refill: 0  3. Mild intermittent asthma without complication Generally stable. Continue to use inhalers and respiratory medications as prescribed. Refills sent to his pharmacy today.  - ADVAIR DISKUS 100-50 MCG/ACT AEPB; Inhale 1 puff into the lungs 2 (two) times daily.  Dispense: 3 each; Refill: 1 - albuterol (VENTOLIN HFA) 108 (90 Base) MCG/ACT inhaler; Inhale 1-2 puffs into the lungs every 6 (six)  hours as needed for wheezing or shortness of breath.  Dispense: 24 g; Refill: 1  4. Low testosterone Check testosterone, PSA, fasting lipids, and other fasting labs in next few days. Adjust testosterone dosing as indicated.   5. Body mass index (BMI) of 32.0-32.9 in adult Encourage patient to limit calorie intake to 2000 cal/day or less.  He should consume a low cholesterol, low-fat diet.  Patient should incorporate exercise into his daily routine.   6. Screening for colon cancer Order for cologuard sent to [facility] today. - Cologuard   Problem List Items Addressed This Visit       Cardiovascular and Mediastinum   Essential hypertension   Relevant Medications   lisinopril-hydrochlorothiazide (ZESTORETIC) 10-12.5 MG tablet     Respiratory   Mild intermittent asthma without complication   Relevant Medications   predniSONE (DELTASONE) 20 MG tablet   ADVAIR DISKUS 100-50 MCG/ACT AEPB   albuterol (VENTOLIN HFA) 108 (90 Base) MCG/ACT inhaler     Other   Screening for colon cancer   Relevant Orders   Cologuard   Low testosterone   Body mass index (BMI) of 32.0-32.9 in adult   Other Visit Diagnoses     Encounter to establish care    -  Primary       Outpatient Encounter Medications as of 06/16/2021  Medication Sig   azithromycin (ZITHROMAX) 250 MG tablet Take 250 mg by mouth as directed.   cefdinir (OMNICEF) 300 MG capsule Take 300 mg by mouth 2 (two) times daily.    Multiple Vitamin (MULTIVITAMIN WITH MINERALS) TABS tablet Take 1 tablet by mouth daily.   predniSONE (DELTASONE) 20 MG tablet Take by mouth.   testosterone cypionate (DEPOTESTOSTERONE CYPIONATE) 200 MG/ML injection SMARTSIG:1 Milliliter(s) IM   [DISCONTINUED] ADVAIR DISKUS 100-50 MCG/ACT AEPB Inhale 1 puff into the lungs 2 (two) times daily.   [DISCONTINUED] albuterol (VENTOLIN HFA) 108 (90 Base) MCG/ACT inhaler Inhale 1-2 puffs into the lungs every 6 (six) hours as needed for wheezing or shortness of breath.   [DISCONTINUED] lisinopril-hydrochlorothiazide (ZESTORETIC) 10-12.5 MG tablet Take 1 tablet by mouth daily.   ADVAIR DISKUS 100-50 MCG/ACT AEPB Inhale 1 puff into the lungs 2 (two) times daily.   albuterol (VENTOLIN HFA) 108 (90 Base) MCG/ACT inhaler Inhale 1-2 puffs into the lungs every 6 (six) hours as needed for wheezing or shortness of breath.   lisinopril-hydrochlorothiazide (ZESTORETIC) 10-12.5 MG tablet Take 1 tablet by mouth daily.   No facility-administered encounter medications on file as of 06/16/2021.    Follow-up: Return in about 6 weeks (around 07/28/2021) for health maintenance exam - need records from Dr. Rex Kras - see below for labs -.   Ronnell Freshwater, NP

## 2021-06-16 NOTE — Patient Instructions (Signed)
Fat and Cholesterol Restricted Eating Plan Getting too much fat and cholesterol in your diet may cause health problems. Choosing the right foods helps keep your fat and cholesterol at normal levels. This can keep you from getting certain diseases. Your doctor may recommend an eating plan that includes: Total fat: ______% or less of total calories a day. This is ______g of fat a day. Saturated fat: ______% or less of total calories a day. This is ______g of saturated fat a day. Cholesterol: less than _________mg a day. Fiber: ______g a day. What are tips for following this plan? General tips Work with your doctor to lose weight if you need to. Avoid: Foods with added sugar. Fried foods. Foods with trans fat or partially hydrogenated oils. This includes some margarines and baked goods. If you drink alcohol: Limit how much you have to: 0-1 drink a day for women who are not pregnant. 0-2 drinks a day for men. Know how much alcohol is in a drink. In the [place] one drink equals one 12 oz bottle of beer (355 mL), one 5 oz glass of wine (148 mL), or one 1 oz glass of hard liquor (44 mL). Reading food labels Check food labels for: Trans fats. Partially hydrogenated oils. Saturated fat (g) in each serving. Cholesterol (mg) in each serving. Fiber (g) in each serving. Choose foods with healthy fats, such as: Monounsaturated fats and polyunsaturated fats. These include olive and canola oil, flaxseeds, walnuts, almonds, and seeds. Omega-3 fats. These are found in certain fish, flaxseed oil, and ground flaxseeds. Choose grain products that have whole grains. Look for the word "whole" as the first word in the ingredient list. Cooking Cook foods using low-fat methods. These include baking, boiling, grilling, and broiling. Eat more home-cooked foods. Eat at restaurants and buffets less often. Eat less fast food. Avoid cooking using saturated fats, such as butter, cream, palm oil, palm kernel oil, and  coconut oil. Meal planning  At meals, divide your plate into four equal parts: Fill one-half of your plate with vegetables, green salads, and fruit. Fill one-fourth of your plate with whole grains. Fill one-fourth of your plate with low-fat (lean) protein foods. Eat fish that is high in omega-3 fats at least two times a week. This includes mackerel, tuna, sardines, and salmon. Eat foods that are high in fiber, such as whole grains, beans, apples, pears, berries, broccoli, carrots, peas, and barley. What foods should I eat? Fruits All fresh, canned (in natural juice), or frozen fruits. Vegetables Fresh or frozen vegetables (raw, steamed, roasted, or grilled). Green salads. Grains Whole grains, such as whole wheat or whole grain breads, crackers, cereals, and pasta. Unsweetened oatmeal, bulgur, barley, quinoa, or brown rice. Corn or whole wheat flour tortillas. Meats and other protein foods Ground beef (85% or leaner), grass-fed beef, or beef trimmed of fat. Skinless chicken or turkey. Ground chicken or turkey. Pork trimmed of fat. All fish and seafood. Egg whites. Dried beans, peas, or lentils. Unsalted nuts or seeds. Unsalted canned beans. Nut butters without added sugar or oil. Dairy Low-fat or nonfat dairy products, such as skim or 1% milk, 2% or reduced-fat cheeses, low-fat and fat-free ricotta or cottage cheese, or plain low-fat and nonfat yogurt. Fats and oils Tub margarine without trans fats. Light or reduced-fat mayonnaise and salad dressings. Avocado. Olive, canola, sesame, or safflower oils. The items listed above may not be a complete list of foods and beverages you can eat. Contact a dietitian for more information. What foods   should I avoid? Fruits Canned fruit in heavy syrup. Fruit in cream or butter sauce. Fried fruit. Vegetables Vegetables cooked in cheese, cream, or butter sauce. Fried vegetables. Grains White bread. White pasta. White rice. Cornbread. Bagels, pastries,  and croissants. Crackers and snack foods that contain trans fat and hydrogenated oils. Meats and other protein foods Fatty cuts of meat. Ribs, chicken wings, bacon, sausage, bologna, salami, chitterlings, fatback, hot dogs, bratwurst, and packaged lunch meats. Liver and organ meats. Whole eggs and egg yolks. Chicken and turkey with skin. Fried meat. Dairy Whole or 2% milk, cream, half-and-half, and cream cheese. Whole milk cheeses. Whole-fat or sweetened yogurt. Full-fat cheeses. Nondairy creamers and whipped toppings. Processed cheese, cheese spreads, and cheese curds. Fats and oils Butter, stick margarine, lard, shortening, ghee, or bacon fat. Coconut, palm kernel, and palm oils. Beverages Alcohol. Sugar-sweetened drinks such as sodas, lemonade, and fruit drinks. Sweets and desserts Corn syrup, sugars, honey, and molasses. Candy. Jam and jelly. Syrup. Sweetened cereals. Cookies, pies, cakes, donuts, muffins, and ice cream. The items listed above may not be a complete list of foods and beverages you should avoid. Contact a dietitian for more information. Summary Choosing the right foods helps keep your fat and cholesterol at normal levels. This can keep you from getting certain diseases. At meals, fill one-half of your plate with vegetables, green salads, and fruits. Eat high fiber foods, like whole grains, beans, apples, pears, berries, carrots, peas, and barley. Limit added sugar, saturated fats, alcohol, and fried foods. This information is not intended to replace advice given to you by your health care provider. Make sure you discuss any questions you have with your health care provider. Document Revised: 10/04/2020 Document Reviewed: 10/04/2020 Elsevier Patient Education  2022 [employer] Inc.  

## 2021-06-17 ENCOUNTER — Other Ambulatory Visit: Payer: BC Managed Care – PPO

## 2021-06-17 ENCOUNTER — Telehealth: Payer: Self-pay | Admitting: Nurse Practitioner

## 2021-06-17 DIAGNOSIS — Z7689 Persons encountering health services in other specified circumstances: Secondary | ICD-10-CM

## 2021-06-17 DIAGNOSIS — R7989 Other specified abnormal findings of blood chemistry: Secondary | ICD-10-CM

## 2021-06-17 DIAGNOSIS — Z0000 Encounter for general adult medical examination without abnormal findings: Secondary | ICD-10-CM

## 2021-06-17 DIAGNOSIS — Z1211 Encounter for screening for malignant neoplasm of colon: Secondary | ICD-10-CM

## 2021-06-17 NOTE — Telephone Encounter (Signed)
I think he said he self administers these injections. He had testosterone level drawn today. I will adjust the dosing as indicated.

## 2021-06-17 NOTE — Telephone Encounter (Signed)
Patient states that he has been getting testosteron 200mg  per 60ml. He is administering 9ml weekly.

## 2021-06-18 LAB — LIPID PANEL
Chol/HDL Ratio: 4.6 ratio (ref 0.0–5.0)
Cholesterol, Total: 157 mg/dL (ref 100–199)
HDL: 34 mg/dL — ABNORMAL LOW (ref >39)
LDL Chol Calc (NIH): 104 mg/dL — ABNORMAL HIGH (ref 0–99)
Triglycerides: 101 mg/dL (ref 0–149)
VLDL Cholesterol Cal: 19 mg/dL (ref 5–40)

## 2021-06-18 LAB — COMPREHENSIVE METABOLIC PANEL
ALT: 27 IU/L (ref 0–44)
AST: 25 IU/L (ref 0–40)
Albumin/Globulin Ratio: 1.7 (ref 1.2–2.2)
Albumin: 4.5 g/dL (ref 3.8–4.9)
Alkaline Phosphatase: 69 IU/L (ref 44–121)
BUN/Creatinine Ratio: 17 (ref 9–20)
BUN: 18 mg/dL (ref 6–24)
Bilirubin Total: 0.6 mg/dL (ref 0.0–1.2)
CO2: 26 mmol/L (ref 20–29)
Calcium: 9.2 mg/dL (ref 8.7–10.2)
Chloride: 99 mmol/L (ref 96–106)
Creatinine, Ser: 1.05 mg/dL (ref 0.76–1.27)
Globulin, Total: 2.7 g/dL (ref 1.5–4.5)
Glucose: 93 mg/dL (ref 70–99)
Potassium: 4.3 mmol/L (ref 3.5–5.2)
Sodium: 141 mmol/L (ref 134–144)
Total Protein: 7.2 g/dL (ref 6.0–8.5)
eGFR: 85 mL/min/{1.73_m2} (ref >59)

## 2021-06-18 LAB — CBC WITH DIFFERENTIAL/PLATELET
Basophils Absolute: 0.1 10*3/uL (ref 0.0–0.2)
Basos: 1 %
EOS (ABSOLUTE): 0.1 10*3/uL (ref 0.0–0.4)
Eos: 1 %
Hematocrit: 47.6 % (ref 37.5–51.0)
Hemoglobin: 16.2 g/dL (ref 13.0–17.7)
Immature Grans (Abs): 0 10*3/uL (ref 0.0–0.1)
Immature Granulocytes: 0 %
Lymphocytes Absolute: 3.5 10*3/uL — ABNORMAL HIGH (ref 0.7–3.1)
Lymphs: 35 %
MCH: 31.6 pg (ref 26.6–33.0)
MCHC: 34 g/dL (ref 31.5–35.7)
MCV: 93 fL (ref 79–97)
Monocytes Absolute: 0.8 10*3/uL (ref 0.1–0.9)
Monocytes: 8 %
Neutrophils Absolute: 5.5 10*3/uL (ref 1.4–7.0)
Neutrophils: 55 %
Platelets: 362 10*3/uL (ref 150–450)
RBC: 5.12 x10E6/uL (ref 4.14–5.80)
RDW: 12.3 % (ref 11.6–15.4)
WBC: 10.1 10*3/uL (ref 3.4–10.8)

## 2021-06-18 LAB — HEMOGLOBIN A1C
Est. average glucose Bld gHb Est-mCnc: 105 mg/dL
Hgb A1c MFr Bld: 5.3 % (ref 4.8–5.6)

## 2021-06-18 LAB — PROLACTIN: Prolactin: 9.6 ng/mL (ref 4.0–15.2)

## 2021-06-18 LAB — TSH: TSH: 2.1 u[IU]/mL (ref 0.450–4.500)

## 2021-06-18 LAB — TESTOSTERONE: Testosterone: 86 ng/dL — ABNORMAL LOW (ref 264–916)

## 2021-06-18 LAB — PSA: Prostate Specific Ag, Serum: 3.8 ng/mL (ref 0.0–4.0)

## 2021-06-18 NOTE — Progress Notes (Signed)
Please let the patient know that his labs are back. His LDLis mildly elevated. Recommend the patient limit intake of fried and fatty foods. Increase intake of lean proteins and green leafy vegetables. Also recommend increased physical activity.  Also, his testosterone is very low. I know he is supposed to be taking the 200mg  injections weekly. Can you ask him when his last shot was given and if he needs new precipitation? All other labs looked good. Thanks.

## 2021-06-29 LAB — COLOGUARD: COLOGUARD: NEGATIVE

## 2021-06-30 NOTE — Progress Notes (Signed)
Please let the patient know that his cologuard test was negative. We should repeat this in three years.  ?Thanks so much.   -HB

## 2021-07-09 ENCOUNTER — Other Ambulatory Visit: Payer: Self-pay | Admitting: Physician Assistant

## 2021-07-09 NOTE — Telephone Encounter (Signed)
Refill with one vial. Patient has appoointment 07/29/2021 for follow up

## 2021-07-29 ENCOUNTER — Encounter: Payer: BC Managed Care – PPO | Admitting: Nurse Practitioner

## 2021-08-12 ENCOUNTER — Encounter: Payer: Self-pay | Admitting: Nurse Practitioner

## 2021-08-12 ENCOUNTER — Other Ambulatory Visit: Payer: Self-pay

## 2021-08-12 ENCOUNTER — Ambulatory Visit (INDEPENDENT_AMBULATORY_CARE_PROVIDER_SITE_OTHER): Payer: BC Managed Care – PPO | Admitting: Nurse Practitioner

## 2021-08-12 VITALS — BP 108/70 | HR 70 | Temp 98.0°F | Ht 76.0 in | Wt 272.6 lb

## 2021-08-12 DIAGNOSIS — R7989 Other specified abnormal findings of blood chemistry: Secondary | ICD-10-CM

## 2021-08-12 DIAGNOSIS — R002 Palpitations: Secondary | ICD-10-CM | POA: Diagnosis not present

## 2021-08-12 DIAGNOSIS — Z0001 Encounter for general adult medical examination with abnormal findings: Secondary | ICD-10-CM

## 2021-08-12 DIAGNOSIS — Z6833 Body mass index (BMI) 33.0-33.9, adult: Secondary | ICD-10-CM

## 2021-08-12 DIAGNOSIS — I10 Essential (primary) hypertension: Secondary | ICD-10-CM

## 2021-08-12 MED ORDER — TESTOSTERONE CYPIONATE 200 MG/ML IM SOLN: INTRAMUSCULAR | 1 refills | Status: DC

## 2021-08-12 NOTE — Progress Notes (Signed)
Established patient visit ? ? ?Patient: Douglas Whitney   DOB: 05/30/1971   51 y.o. Male  MRN: [id number] ?Visit Date: 08/12/2021 ? ? ?Chief Complaint  ?Patient presents with  ? Annual Exam  ? ?Subjective  ?  ?HPI  ? ?The patient is here for annual wellness visit. Did have routine, fasting labs done prior to this.  ?-very low testosterone levels while taking testosterone 200mg  weekly ?-history of palpitations. States that he had a few days a week or so ago, when he had palpitations, felt like "fight or flight" sensation. Start out of no where. Resolved quickly. Denied chest pain, chest pressure, or shortness of breath. Has seen cardiology in the past for similar symptoms. No problems were found.  ?-no current concerns or complaints.  ? ? ?Medications: ?Outpatient Medications Prior to Visit  ?Medication Sig  ? ADVAIR DISKUS 100-50 MCG/ACT AEPB Inhale 1 puff into the lungs 2 (two) times daily.  ? albuterol (VENTOLIN HFA) 108 (90 Base) MCG/ACT inhaler Inhale 1-2 puffs into the lungs every 6 (six) hours as needed for wheezing or shortness of breath.  ? lisinopril-hydrochlorothiazide (ZESTORETIC) 10-12.5 MG tablet Take 1 tablet by mouth daily.  ? Multiple Vitamin (MULTIVITAMIN WITH MINERALS) TABS tablet Take 1 tablet by mouth daily.  ? [DISCONTINUED] azithromycin (ZITHROMAX) 250 MG tablet Take 250 mg by mouth as directed.  ? [DISCONTINUED] cefdinir (OMNICEF) 300 MG capsule Take 300 mg by mouth 2 (two) times daily.  ? [DISCONTINUED] predniSONE (DELTASONE) 20 MG tablet Take by mouth.  ? [DISCONTINUED] testosterone cypionate (DEPOTESTOSTERONE CYPIONATE) 200 MG/ML injection INJECT 1 ML (200 MG TOTAL) INTO THE MUSCLE 3 TIMES A MONTH.  ? ?No facility-administered medications prior to visit.  ? ? ?Review of Systems  ?Constitutional:  Negative for activity change, chills, fatigue and fever.  ?HENT:  Negative for congestion, postnasal drip, rhinorrhea, sinus pressure, sinus pain, sneezing and sore throat.   ?Eyes: Negative.    ?Respiratory:  Negative for cough, shortness of breath and wheezing.   ?Cardiovascular:  Positive for palpitations. Negative for chest pain.  ?Gastrointestinal:  Negative for constipation, diarrhea, nausea and vomiting.  ?Endocrine: Negative for cold intolerance, heat intolerance, polydipsia and polyuria.  ?     Very low testosterone.   ?Genitourinary:  Negative for dysuria, frequency and urgency.  ?Musculoskeletal:  Negative for back pain and myalgias.  ?Skin:  Negative for rash.  ?Allergic/Immunologic: Negative for environmental allergies.  ?Neurological:  Negative for dizziness, weakness and headaches.  ?Psychiatric/Behavioral:  The patient is not nervous/anxious.   ? ?Last CBC ?Lab Results  ?Component Value Date  ? WBC 10.1 06/17/2021  ? HGB 16.2 06/17/2021  ? HCT 47.6 06/17/2021  ? MCV 93 06/17/2021  ? MCH 31.6 06/17/2021  ? RDW 12.3 06/17/2021  ? PLT 362 06/17/2021  ? ?Last metabolic panel ?Lab Results  ?Component Value Date  ? GLUCOSE 93 06/17/2021  ? NA 141 06/17/2021  ? K 4.3 06/17/2021  ? CL 99 06/17/2021  ? CO2 26 06/17/2021  ? BUN 18 06/17/2021  ? CREATININE 1.05 06/17/2021  ? EGFR 85 06/17/2021  ? CALCIUM 9.2 06/17/2021  ? PROT 7.2 06/17/2021  ? ALBUMIN 4.5 06/17/2021  ? LABGLOB 2.7 06/17/2021  ? AGRATIO 1.7 06/17/2021  ? BILITOT 0.6 06/17/2021  ? ALKPHOS 69 06/17/2021  ? AST 25 06/17/2021  ? ALT 27 06/17/2021  ? ANIONGAP 9 07/06/2019  ? ?Last lipids ?Lab Results  ?Component Value Date  ? CHOL 157 06/17/2021  ? HDL 34 (L) 06/17/2021  ?  LDLCALC 104 (H) 06/17/2021  ? TRIG 101 06/17/2021  ? CHOLHDL 4.6 06/17/2021  ? ?Last hemoglobin A1c ?Lab Results  ?Component Value Date  ? HGBA1C 5.3 06/17/2021  ? ?Last thyroid functions ?Lab Results  ?Component Value Date  ? TSH 2.100 06/17/2021  ? ?  ? ? Objective  ?  ? ?Today's Vitals  ? 08/12/21 6579  ?BP: 108/70  ?Pulse: 70  ?Temp: 98 ?F (36.7 ?C)  ?SpO2: 98%  ?Weight: 272 lb 9.6 oz (123.7 kg)  ?Height: 6\' 4"  (1.93 m)  ? ?Body mass index is 33.18 kg/m?.  ? ?BP  Readings from Last 3 Encounters:  ?08/12/21 108/70  ?06/16/21 (!) 153/72  ?03/04/21 132/70  ?  ?Wt Readings from Last 3 Encounters:  ?08/12/21 272 lb 9.6 oz (123.7 kg)  ?06/16/21 270 lb 6.4 oz (122.7 kg)  ?03/04/21 271 lb 6 oz (123.1 kg)  ?  ?Physical Exam ?Vitals and nursing note reviewed.  ?Constitutional:   ?   Appearance: Normal appearance. He is well-developed.  ?HENT:  ?   Head: Normocephalic and atraumatic.  ?   Right Ear: Tympanic membrane, ear canal and external ear normal.  ?   Left Ear: Ear canal and external ear normal.  ?   Nose: Nose normal.  ?   Mouth/Throat:  ?   Mouth: Mucous membranes are dry.  ?   Pharynx: Oropharynx is clear.  ?Eyes:  ?   Pupils: Pupils are equal, round, and reactive to light.  ?Cardiovascular:  ?   Rate and Rhythm: Normal rate and regular rhythm.  ?   Pulses: Normal pulses.  ?   Heart sounds: Normal heart sounds.  ?Pulmonary:  ?   Effort: Pulmonary effort is normal.  ?   Breath sounds: Normal breath sounds.  ?Abdominal:  ?   General: Bowel sounds are normal. There is no distension.  ?   Palpations: Abdomen is soft. There is no mass.  ?   Tenderness: There is no abdominal tenderness. There is no guarding or rebound.  ?   Hernia: No hernia is present.  ?Musculoskeletal:     ?   General: Normal range of motion.  ?   Cervical back: Normal range of motion and neck supple.  ?Lymphadenopathy:  ?   Cervical: No cervical adenopathy.  ?Skin: ?   General: Skin is warm and dry.  ?   Capillary Refill: Capillary refill takes less than 2 seconds.  ?Neurological:  ?   General: No focal deficit present.  ?   Mental Status: He is alert and oriented to person, place, and time.  ?Psychiatric:     ?   Mood and Affect: Mood normal.     ?   Behavior: Behavior normal.     ?   Thought Content: Thought content normal.     ?   Judgment: Judgment normal.  ?  ? ? Assessment & Plan  ?  ?1. Encounter for general adult medical examination with abnormal findings ?Annual wellness visit today  ? ?2. Low  testosterone ?Will conitnue having patient take testosterone injections 200mg  once weekly. Recheck testosteron, prolactin, CMP, and fasting lipids prior to next visit and adjust dosing as indicated.  ?- testosterone cypionate (DEPOTESTOSTERONE CYPIONATE) 200 MG/ML injection; Inject 200mg  [place] q week  Dispense: 10 mL; Refill: 1 ? ?3. Essential hypertension ?Stable. Continue BP medication as prescribed  ? ?4. Palpitation ?Resolved. Patient advised to be seen if palpitations occur again, especially if chest pain or shortness of breath  accompany them.  ? ?5. Body mass index (BMI) of 33.0-33.9 in adult ?Encourage patient to limit calorie intake to 2000 cal/day or less.  He should consume a low cholesterol, low-fat diet.   ? ?Problem List Items Addressed This Visit   ? ?  ? Cardiovascular and Mediastinum  ? Essential hypertension  ?  ? Other  ? Low testosterone  ? Relevant Medications  ? testosterone cypionate (DEPOTESTOSTERONE CYPIONATE) 200 MG/ML injection  ? Body mass index (BMI) of 33.0-33.9 in adult  ? Palpitation  ? ?Other Visit Diagnoses   ? ? Encounter for general adult medical examination with abnormal findings    -  Primary  ? ?  ?  ? ?Return in about 4 months (around 12/12/2021) for blood pressure, check fasting lipids, prolactin, and testosterone. .  ?   ? ? ? ? ?Ronnell Freshwater, NP  ?[facility] ?[phone] (phone) ?[phone] (fax) ? ?[facility]  ?

## 2021-08-12 NOTE — Patient Instructions (Signed)
Fat and Cholesterol Restricted Eating Plan Getting too much fat and cholesterol in your diet may cause health problems. Choosing the right foods helps keep your fat and cholesterol at normal levels. This can keep you from getting certain diseases. Your doctor may recommend an eating plan that includes: Total fat: ______% or less of total calories a day. This is ______g of fat a day. Saturated fat: ______% or less of total calories a day. This is ______g of saturated fat a day. Cholesterol: less than _________mg a day. Fiber: ______g a day. What are tips for following this plan? General tips Work with your doctor to lose weight if you need to. Avoid: Foods with added sugar. Fried foods. Foods with trans fat or partially hydrogenated oils. This includes some margarines and baked goods. If you drink alcohol: Limit how much you have to: 0-1 drink a day for women who are not pregnant. 0-2 drinks a day for men. Know how much alcohol is in a drink. In the [place] one drink equals one 12 oz bottle of beer (355 mL), one 5 oz glass of wine (148 mL), or one 1 oz glass of hard liquor (44 mL). Reading food labels Check food labels for: Trans fats. Partially hydrogenated oils. Saturated fat (g) in each serving. Cholesterol (mg) in each serving. Fiber (g) in each serving. Choose foods with healthy fats, such as: Monounsaturated fats and polyunsaturated fats. These include olive and canola oil, flaxseeds, walnuts, almonds, and seeds. Omega-3 fats. These are found in certain fish, flaxseed oil, and ground flaxseeds. Choose grain products that have whole grains. Look for the word "whole" as the first word in the ingredient list. Cooking Cook foods using low-fat methods. These include baking, boiling, grilling, and broiling. Eat more home-cooked foods. Eat at restaurants and buffets less often. Eat less fast food. Avoid cooking using saturated fats, such as butter, cream, palm oil, palm kernel oil, and  coconut oil. Meal planning  At meals, divide your plate into four equal parts: Fill one-half of your plate with vegetables, green salads, and fruit. Fill one-fourth of your plate with whole grains. Fill one-fourth of your plate with low-fat (lean) protein foods. Eat fish that is high in omega-3 fats at least two times a week. This includes mackerel, tuna, sardines, and salmon. Eat foods that are high in fiber, such as whole grains, beans, apples, pears, berries, broccoli, carrots, peas, and barley. What foods should I eat? Fruits All fresh, canned (in natural juice), or frozen fruits. Vegetables Fresh or frozen vegetables (raw, steamed, roasted, or grilled). Green salads. Grains Whole grains, such as whole wheat or whole grain breads, crackers, cereals, and pasta. Unsweetened oatmeal, bulgur, barley, quinoa, or brown rice. Corn or whole wheat flour tortillas. Meats and other protein foods Ground beef (85% or leaner), grass-fed beef, or beef trimmed of fat. Skinless chicken or turkey. Ground chicken or turkey. Pork trimmed of fat. All fish and seafood. Egg whites. Dried beans, peas, or lentils. Unsalted nuts or seeds. Unsalted canned beans. Nut butters without added sugar or oil. Dairy Low-fat or nonfat dairy products, such as skim or 1% milk, 2% or reduced-fat cheeses, low-fat and fat-free ricotta or cottage cheese, or plain low-fat and nonfat yogurt. Fats and oils Tub margarine without trans fats. Light or reduced-fat mayonnaise and salad dressings. Avocado. Olive, canola, sesame, or safflower oils. The items listed above may not be a complete list of foods and beverages you can eat. Contact a dietitian for more information. What foods   should I avoid? Fruits Canned fruit in heavy syrup. Fruit in cream or butter sauce. Fried fruit. Vegetables Vegetables cooked in cheese, cream, or butter sauce. Fried vegetables. Grains White bread. White pasta. White rice. Cornbread. Bagels, pastries,  and croissants. Crackers and snack foods that contain trans fat and hydrogenated oils. Meats and other protein foods Fatty cuts of meat. Ribs, chicken wings, bacon, sausage, bologna, salami, chitterlings, fatback, hot dogs, bratwurst, and packaged lunch meats. Liver and organ meats. Whole eggs and egg yolks. Chicken and turkey with skin. Fried meat. Dairy Whole or 2% milk, cream, half-and-half, and cream cheese. Whole milk cheeses. Whole-fat or sweetened yogurt. Full-fat cheeses. Nondairy creamers and whipped toppings. Processed cheese, cheese spreads, and cheese curds. Fats and oils Butter, stick margarine, lard, shortening, ghee, or bacon fat. Coconut, palm kernel, and palm oils. Beverages Alcohol. Sugar-sweetened drinks such as sodas, lemonade, and fruit drinks. Sweets and desserts Corn syrup, sugars, honey, and molasses. Candy. Jam and jelly. Syrup. Sweetened cereals. Cookies, pies, cakes, donuts, muffins, and ice cream. The items listed above may not be a complete list of foods and beverages you should avoid. Contact a dietitian for more information. Summary Choosing the right foods helps keep your fat and cholesterol at normal levels. This can keep you from getting certain diseases. At meals, fill one-half of your plate with vegetables, green salads, and fruits. Eat high fiber foods, like whole grains, beans, apples, pears, berries, carrots, peas, and barley. Limit added sugar, saturated fats, alcohol, and fried foods. This information is not intended to replace advice given to you by your health care provider. Make sure you discuss any questions you have with your health care provider. Document Revised: 10/04/2020 Document Reviewed: 10/04/2020 Elsevier Patient Education  2022 [employer] Inc.  

## 2021-08-21 ENCOUNTER — Encounter: Payer: Self-pay | Admitting: Nurse Practitioner

## 2021-08-25 ENCOUNTER — Encounter: Payer: Self-pay | Admitting: Nurse Practitioner

## 2021-08-25 ENCOUNTER — Ambulatory Visit: Payer: BC Managed Care – PPO | Admitting: Nurse Practitioner

## 2021-08-25 ENCOUNTER — Other Ambulatory Visit: Payer: Self-pay

## 2021-08-25 VITALS — BP 132/83 | HR 87 | Temp 98.5°F | Ht 76.0 in | Wt 273.4 lb

## 2021-08-25 DIAGNOSIS — Z6833 Body mass index (BMI) 33.0-33.9, adult: Secondary | ICD-10-CM

## 2021-08-25 DIAGNOSIS — J0140 Acute pansinusitis, unspecified: Secondary | ICD-10-CM | POA: Diagnosis not present

## 2021-08-25 DIAGNOSIS — B372 Candidiasis of skin and nail: Secondary | ICD-10-CM | POA: Diagnosis not present

## 2021-08-25 MED ORDER — CLOTRIMAZOLE-BETAMETHASONE 1-0.05 % EX CREA: 1.0000 "application " | TOPICAL_CREAM | Freq: Two times a day (BID) | CUTANEOUS | 1 refills | Status: DC

## 2021-08-25 MED ORDER — CEFUROXIME AXETIL 500 MG PO TABS: 500.0000 mg | ORAL_TABLET | Freq: Two times a day (BID) | ORAL | 0 refills | Status: DC

## 2021-08-25 NOTE — Progress Notes (Signed)
Established patient visit ? ? ?Patient: Douglas Whitney   DOB: 10-Feb-1971   51 y.o. Male  MRN: [id number] ?Visit Date: 08/25/2021 ? ?Chief Complaint  ?Patient presents with  ? Nasal Congestion  ? ?Subjective  ?  ?Sinusitis ?This is a new problem. The current episode started in the past 7 days. The problem has been gradually improving since onset. There has been no fever. Associated symptoms include congestion, coughing, headaches and sinus pressure. Pertinent negatives include no chills, shortness of breath, sneezing or sore throat. Treatments tried: Delsym for cough and NSAIDs which helped for pain contorl. The treatment provided mild relief.   ? ? ?Medications: ?Outpatient Medications Prior to Visit  ?Medication Sig  ? ADVAIR DISKUS 100-50 MCG/ACT AEPB Inhale 1 puff into the lungs 2 (two) times daily.  ? albuterol (VENTOLIN HFA) 108 (90 Base) MCG/ACT inhaler Inhale 1-2 puffs into the lungs every 6 (six) hours as needed for wheezing or shortness of breath.  ? lisinopril-hydrochlorothiazide (ZESTORETIC) 10-12.5 MG tablet Take 1 tablet by mouth daily.  ? Multiple Vitamin (MULTIVITAMIN WITH MINERALS) TABS tablet Take 1 tablet by mouth daily.  ? testosterone cypionate (DEPOTESTOSTERONE CYPIONATE) 200 MG/ML injection Inject 200mg  [place] q week  ? ?No facility-administered medications prior to visit.  ? ? ?Review of Systems  ?Constitutional:  Positive for fatigue. Negative for activity change, chills and fever.  ?HENT:  Positive for congestion, postnasal drip, rhinorrhea, sinus pressure and sinus pain. Negative for sneezing and sore throat.   ?Eyes: Negative.   ?Respiratory:  Positive for cough. Negative for shortness of breath and wheezing.   ?Cardiovascular:  Negative for chest pain and palpitations.  ?Gastrointestinal:  Positive for nausea. Negative for constipation, diarrhea and vomiting.  ?Endocrine: Negative for cold intolerance, heat intolerance, polydipsia and polyuria.  ?Genitourinary:  Negative for dysuria, frequency  and urgency.  ?Musculoskeletal:  Negative for back pain and myalgias.  ?Skin:  Positive for rash.  ?     Round shaped, itchy rash on the top of the foot. Started off very small and is gradually spreading in outward directed and getting worse   ?Allergic/Immunologic: Positive for environmental allergies.  ?Neurological:  Positive for headaches. Negative for dizziness and weakness.  ?Psychiatric/Behavioral:  The patient is not nervous/anxious.   ? ? Objective  ?  ? ?Today's Vitals  ? 08/25/21 1436  ?BP: 132/83  ?Pulse: 87  ?Temp: 98.5 ?F (36.9 ?C)  ?SpO2: 97%  ?Weight: 273 lb 6.4 oz (124 kg)  ?Height: 6\' 4"  (1.93 m)  ? ?Body mass index is 33.28 kg/m?.  ? ?Physical Exam ?Vitals and nursing note reviewed.  ?Constitutional:   ?   Appearance: Normal appearance. He is well-developed. He is ill-appearing.  ?HENT:  ?   Head: Normocephalic.  ?   Right Ear: Tympanic membrane is erythematous and bulging.  ?   Left Ear: Tympanic membrane is erythematous and bulging.  ?   Nose: Congestion present.  ?   Right Sinus: Maxillary sinus tenderness and frontal sinus tenderness present.  ?   Left Sinus: Maxillary sinus tenderness and frontal sinus tenderness present.  ?   Mouth/Throat:  ?   Pharynx: Posterior oropharyngeal erythema present.  ?Eyes:  ?   Pupils: Pupils are equal, round, and reactive to light.  ?Cardiovascular:  ?   Rate and Rhythm: Normal rate and regular rhythm.  ?   Pulses: Normal pulses.  ?   Heart sounds: Normal heart sounds.  ?Pulmonary:  ?   Effort: Pulmonary effort is normal.  ?  Breath sounds: Normal breath sounds.  ?   Comments: Dry, non-productive cough noted  ?Abdominal:  ?   Palpations: Abdomen is soft.  ?Musculoskeletal:     ?   General: Normal range of motion.  ?   Cervical back: Normal range of motion and neck supple.  ?Lymphadenopathy:  ?   Cervical: Cervical adenopathy present.  ?Skin: ?   General: Skin is warm and dry.  ?   Capillary Refill: Capillary refill takes less than 2 seconds.  ?   Comments:  Small, round, coarsely textured rash present on the dorsal surface of the foot. Itchy.   ?Neurological:  ?   General: No focal deficit present.  ?   Mental Status: He is alert and oriented to person, place, and time.  ?Psychiatric:     ?   Mood and Affect: Mood normal.     ?   Behavior: Behavior normal.     ?   Thought Content: Thought content normal.     ?   Judgment: Judgment normal.  ?  ? ? Assessment & Plan  ?  ? ?1. Acute non-recurrent pansinusitis ?Start ceftin 500mg  twice daily for 10 days. Rest and increase fluids. Continue using OTC medication to control symptoms.   ?- cefUROXime (CEFTIN) 500 MG tablet; Take 1 tablet (500 mg total) by mouth 2 (two) times daily with a meal.  Dispense: 20 tablet; Refill: 0 ? ?2. Candidiasis, cutaneous ?Start lotrisone cream. Apply twice daily for next 7 to 10 days.  ?- clotrimazole-betamethasone (LOTRISONE) cream; Apply 1 application. topically 2 (two) times daily.  Dispense: 45 g; Refill: 1 ? ?3. Body mass index (BMI) of 33.0-33.9 in adult ?Encourage patient to limit calorie intake to 2000 cal/day or less.  He should consume a low cholesterol, low-fat diet.     ? ?Return for prn worsening or persistent symptoms.  ?   ? ? ? ?11-16-1980, NP  ?[facility] ?[phone] (phone) ?[phone] (fax) ? ?[facility] ?

## 2021-08-31 DIAGNOSIS — B372 Candidiasis of skin and nail: Secondary | ICD-10-CM | POA: Insufficient documentation

## 2021-08-31 DIAGNOSIS — J0140 Acute pansinusitis, unspecified: Secondary | ICD-10-CM | POA: Insufficient documentation

## 2021-09-22 ENCOUNTER — Other Ambulatory Visit: Payer: Self-pay | Admitting: Nurse Practitioner

## 2021-09-22 DIAGNOSIS — R7989 Other specified abnormal findings of blood chemistry: Secondary | ICD-10-CM

## 2021-09-22 DIAGNOSIS — I10 Essential (primary) hypertension: Secondary | ICD-10-CM

## 2021-09-22 MED ORDER — TESTOSTERONE CYPIONATE 200 MG/ML IM SOLN: INTRAMUSCULAR | 1 refills | Status: DC

## 2021-09-22 NOTE — Progress Notes (Signed)
Renewed prescription for testosterone. Blood pressure medication already filled and sent to [facility].  ?

## 2021-10-24 ENCOUNTER — Other Ambulatory Visit: Payer: Self-pay | Admitting: Nurse Practitioner

## 2021-10-24 DIAGNOSIS — J4520 Mild intermittent asthma, uncomplicated: Secondary | ICD-10-CM

## 2021-12-05 ENCOUNTER — Other Ambulatory Visit: Payer: Self-pay | Admitting: Nurse Practitioner

## 2021-12-05 DIAGNOSIS — I10 Essential (primary) hypertension: Secondary | ICD-10-CM

## 2021-12-14 NOTE — Progress Notes (Unsigned)
Established patient visit   Patient: Douglas Whitney   DOB: 05-12-1971   51 y.o. Male  MRN: [id number] Visit Date: 12/15/2021   No chief complaint on file.  Subjective    HPI  Patient presents for follow up. -blood pressure  --check fasting lipids, testosterone, prolactin, and CMP. -most recent testosterone done 06/17/2021 and was 86 -currently takes testosterone injection 200mg  weekly.    Medications: Outpatient Medications Prior to Visit  Medication Sig   ADVAIR DISKUS 100-50 MCG/ACT AEPB Inhale 1 puff into the lungs 2 (two) times daily.   albuterol (VENTOLIN HFA) 108 (90 Base) MCG/ACT inhaler INHALE 1 TO 2 PUFFS INTO THE LUNGS EVERY 6 HOURS AS NEEDED FOR WHEEZING OR SHORTNESS OF BREATH.   cefUROXime (CEFTIN) 500 MG tablet Take 1 tablet (500 mg total) by mouth 2 (two) times daily with a meal.   clotrimazole-betamethasone (LOTRISONE) cream Apply 1 application. topically 2 (two) times daily.   lisinopril-hydrochlorothiazide (ZESTORETIC) 10-12.5 MG tablet TAKE 1 TABLET BY MOUTH DAILY.   Multiple Vitamin (MULTIVITAMIN WITH MINERALS) TABS tablet Take 1 tablet by mouth daily.   testosterone cypionate (DEPOTESTOSTERONE CYPIONATE) 200 MG/ML injection Inject 200mg  [place] q week   No facility-administered medications prior to visit.    Review of Systems  {Labs (Optional):23779}   Objective    There were no vitals taken for this visit. BP Readings from Last 3 Encounters:  08/25/21 132/83  08/12/21 108/70  06/16/21 (!) 153/72    Wt Readings from Last 3 Encounters:  08/25/21 273 lb 6.4 oz (124 kg)  08/12/21 272 lb 9.6 oz (123.7 kg)  06/16/21 270 lb 6.4 oz (122.7 kg)    Physical Exam  ***  No results found for any visits on 12/15/21.  Assessment & Plan     Problem List Items Addressed This Visit   None    No follow-ups on file.         08/14/21, NP  [facility] [phone] (phone) [phone] (fax)  [facility]

## 2021-12-15 ENCOUNTER — Ambulatory Visit (INDEPENDENT_AMBULATORY_CARE_PROVIDER_SITE_OTHER): Payer: BC Managed Care – PPO | Admitting: Nurse Practitioner

## 2021-12-15 ENCOUNTER — Encounter: Payer: Self-pay | Admitting: Nurse Practitioner

## 2021-12-15 VITALS — BP 122/78 | HR 70 | Temp 97.7°F | Ht 76.0 in | Wt 266.0 lb

## 2021-12-15 DIAGNOSIS — I10 Essential (primary) hypertension: Secondary | ICD-10-CM

## 2021-12-15 DIAGNOSIS — B372 Candidiasis of skin and nail: Secondary | ICD-10-CM

## 2021-12-15 DIAGNOSIS — J4520 Mild intermittent asthma, uncomplicated: Secondary | ICD-10-CM | POA: Diagnosis not present

## 2021-12-15 DIAGNOSIS — R7989 Other specified abnormal findings of blood chemistry: Secondary | ICD-10-CM

## 2021-12-15 DIAGNOSIS — R002 Palpitations: Secondary | ICD-10-CM

## 2021-12-15 MED ORDER — ALBUTEROL SULFATE HFA 108 (90 BASE) MCG/ACT IN AERS: INHALATION_SPRAY | RESPIRATORY_TRACT | 5 refills | Status: DC

## 2021-12-15 MED ORDER — CLOTRIMAZOLE-BETAMETHASONE 1-0.05 % EX CREA: 1.0000 | TOPICAL_CREAM | Freq: Two times a day (BID) | CUTANEOUS | 2 refills | Status: DC

## 2021-12-15 MED ORDER — TESTOSTERONE CYPIONATE 200 MG/ML IM SOLN
INTRAMUSCULAR | 1 refills | Status: DC
Start: 2021-12-15 — End: 2022-04-02

## 2021-12-15 NOTE — Patient Instructions (Signed)
Hypertension, Adult High blood pressure (hypertension) is when the force of blood pumping through the arteries is too strong. The arteries are the blood vessels that carry blood from the heart throughout the body. Hypertension forces the heart to work harder to pump blood and may cause arteries to become narrow or stiff. Untreated or uncontrolled hypertension can lead to a heart attack, heart failure, a stroke, kidney disease, and other problems. A blood pressure reading consists of a higher number over a lower number. Ideally, your blood pressure should be below 120/80. The first ("top") number is called the systolic pressure. It is a measure of the pressure in your arteries as your heart beats. The second ("bottom") number is called the diastolic pressure. It is a measure of the pressure in your arteries as the heart relaxes. What are the causes? The exact cause of this condition is not known. There are some conditions that result in high blood pressure. What increases the risk? Certain factors may make you more likely to develop high blood pressure. Some of these risk factors are under your control, including: Smoking. Not getting enough exercise or physical activity. Being overweight. Having too much fat, sugar, calories, or salt (sodium) in your diet. Drinking too much alcohol. Other risk factors include: Having a personal history of heart disease, diabetes, high cholesterol, or kidney disease. Stress. Having a family history of high blood pressure and high cholesterol. Having obstructive sleep apnea. Age. The risk increases with age. What are the signs or symptoms? High blood pressure may not cause symptoms. Very high blood pressure (hypertensive crisis) may cause: Headache. Fast or irregular heartbeats (palpitations). Shortness of breath. Nosebleed. Nausea and vomiting. Vision changes. Severe chest pain, dizziness, and seizures. How is this diagnosed? This condition is diagnosed by  measuring your blood pressure while you are seated, with your arm resting on a flat surface, your legs uncrossed, and your feet flat on the floor. The cuff of the blood pressure monitor will be placed directly against the skin of your upper arm at the level of your heart. Blood pressure should be measured at least twice using the same arm. Certain conditions can cause a difference in blood pressure between your right and left arms. If you have a high blood pressure reading during one visit or you have normal blood pressure with other risk factors, you may be asked to: Return on a different day to have your blood pressure checked again. Monitor your blood pressure at home for 1 week or longer. If you are diagnosed with hypertension, you may have other blood or imaging tests to help your health care provider understand your overall risk for other conditions. How is this treated? This condition is treated by making healthy lifestyle changes, such as eating healthy foods, exercising more, and reducing your alcohol intake. You may be referred for counseling on a healthy diet and physical activity. Your health care provider may prescribe medicine if lifestyle changes are not enough to get your blood pressure under control and if: Your systolic blood pressure is above 130. Your diastolic blood pressure is above 80. Your personal target blood pressure may vary depending on your medical conditions, your age, and other factors. Follow these instructions at home: Eating and drinking  Eat a diet that is high in fiber and potassium, and low in sodium, added sugar, and fat. An example of this eating plan is called the DASH diet. DASH stands for Dietary Approaches to Stop Hypertension. To eat this way: Eat   plenty of fresh fruits and vegetables. Try to fill one half of your plate at each meal with fruits and vegetables. Eat whole grains, such as whole-wheat pasta, brown rice, or whole-grain bread. Fill about one  fourth of your plate with whole grains. Eat or drink low-fat dairy products, such as skim milk or low-fat yogurt. Avoid fatty cuts of meat, processed or cured meats, and poultry with skin. Fill about one fourth of your plate with lean proteins, such as fish, chicken without skin, beans, eggs, or tofu. Avoid pre-made and processed foods. These tend to be higher in sodium, added sugar, and fat. Reduce your daily sodium intake. Many people with hypertension should eat less than 1,500 mg of sodium a day. Do not drink alcohol if: Your health care provider tells you not to drink. You are pregnant, may be pregnant, or are planning to become pregnant. If you drink alcohol: Limit how much you have to: 0-1 drink a day for women. 0-2 drinks a day for men. Know how much alcohol is in your drink. In the [place] one drink equals one 12 oz bottle of beer (355 mL), one 5 oz glass of wine (148 mL), or one 1 oz glass of hard liquor (44 mL). Lifestyle  Work with your health care provider to maintain a healthy body weight or to lose weight. Ask what an ideal weight is for you. Get at least 30 minutes of exercise that causes your heart to beat faster (aerobic exercise) most days of the week. Activities may include walking, swimming, or biking. Include exercise to strengthen your muscles (resistance exercise), such as Pilates or lifting weights, as part of your weekly exercise routine. Try to do these types of exercises for 30 minutes at least 3 days a week. Do not use any products that contain nicotine or tobacco. These products include cigarettes, chewing tobacco, and vaping devices, such as e-cigarettes. If you need help quitting, ask your health care provider. Monitor your blood pressure at home as told by your health care provider. Keep all follow-up visits. This is important. Medicines Take over-the-counter and prescription medicines only as told by your health care provider. Follow directions carefully. Blood  pressure medicines must be taken as prescribed. Do not skip doses of blood pressure medicine. Doing this puts you at risk for problems and can make the medicine less effective. Ask your health care provider about side effects or reactions to medicines that you should watch for. Contact a health care provider if you: Think you are having a reaction to a medicine you are taking. Have headaches that keep coming back (recurring). Feel dizzy. Have swelling in your ankles. Have trouble with your vision. Get help right away if you: Develop a severe headache or confusion. Have unusual weakness or numbness. Feel faint. Have severe pain in your chest or abdomen. Vomit repeatedly. Have trouble breathing. These symptoms may be an emergency. Get help right away. Call 911. Do not wait to see if the symptoms will go away. Do not drive yourself to the hospital. Summary Hypertension is when the force of blood pumping through your arteries is too strong. If this condition is not controlled, it may put you at risk for serious complications. Your personal target blood pressure may vary depending on your medical conditions, your age, and other factors. For most people, a normal blood pressure is less than 120/80. Hypertension is treated with lifestyle changes, medicines, or a combination of both. Lifestyle changes include losing weight, eating a healthy,   low-sodium diet, exercising more, and limiting alcohol. This information is not intended to replace advice given to you by your health care provider. Make sure you discuss any questions you have with your health care provider. Document Revised: 04/01/2021 Document Reviewed: 04/01/2021 Elsevier Patient Education  2023 Elsevier Inc.  

## 2021-12-16 LAB — COMPREHENSIVE METABOLIC PANEL
ALT: 21 IU/L (ref 0–44)
AST: 17 IU/L (ref 0–40)
Albumin/Globulin Ratio: 1.7 (ref 1.2–2.2)
Albumin: 4.3 g/dL (ref 3.8–4.9)
Alkaline Phosphatase: 53 IU/L (ref 44–121)
BUN/Creatinine Ratio: 10 (ref 9–20)
BUN: 9 mg/dL (ref 6–24)
Bilirubin Total: 1.1 mg/dL (ref 0.0–1.2)
CO2: 24 mmol/L (ref 20–29)
Calcium: 9.5 mg/dL (ref 8.7–10.2)
Chloride: 99 mmol/L (ref 96–106)
Creatinine, Ser: 0.88 mg/dL (ref 0.76–1.27)
Globulin, Total: 2.5 g/dL (ref 1.5–4.5)
Glucose: 93 mg/dL (ref 70–99)
Potassium: 3.5 mmol/L (ref 3.5–5.2)
Sodium: 138 mmol/L (ref 134–144)
Total Protein: 6.8 g/dL (ref 6.0–8.5)
eGFR: 103 mL/min/{1.73_m2} (ref >59)

## 2021-12-16 LAB — LIPID PANEL
Chol/HDL Ratio: 4.7 ratio (ref 0.0–5.0)
Cholesterol, Total: 154 mg/dL (ref 100–199)
HDL: 33 mg/dL — ABNORMAL LOW (ref >39)
LDL Chol Calc (NIH): 100 mg/dL — ABNORMAL HIGH (ref 0–99)
Triglycerides: 112 mg/dL (ref 0–149)
VLDL Cholesterol Cal: 21 mg/dL (ref 5–40)

## 2021-12-16 LAB — TESTOSTERONE: Testosterone: >1500 ng/dL — ABNORMAL HIGH (ref 264–916)

## 2021-12-16 LAB — PROLACTIN: Prolactin: 13 ng/mL (ref 4.0–15.2)

## 2021-12-19 IMAGING — CT CT CHEST W/O CM
2 of 4 series · 15 of 36 positions shown, 18 images · non-contrast
Comparison: 08/14/2019 and 06/05/2019

CLINICAL DATA: Right upper lobe abscess

EXAM:
CT CHEST WITHOUT CONTRAST
TECHNIQUE: Multidetector CT imaging of the chest was performed following the
standard protocol without IV contrast.

[Series 2: thorax · axial · 0.84mm/px · z∈[-344,-64]mm · 12 of 166 slices shown, 15 images]
[im 13/166  mediastinal]
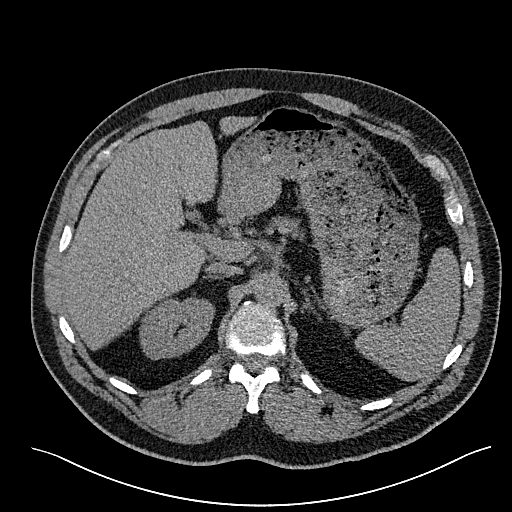
[im 13/166  lung]
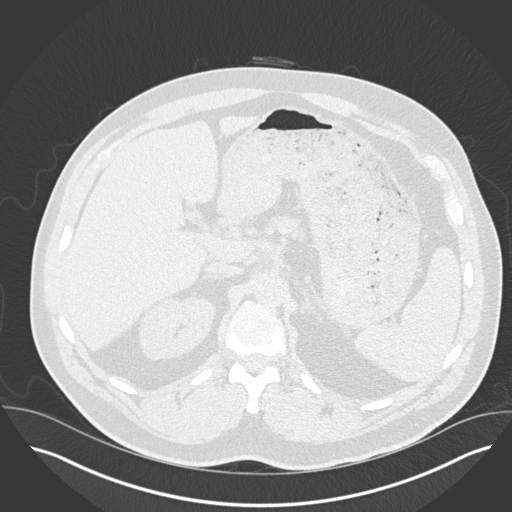
[im 26/166  lung]
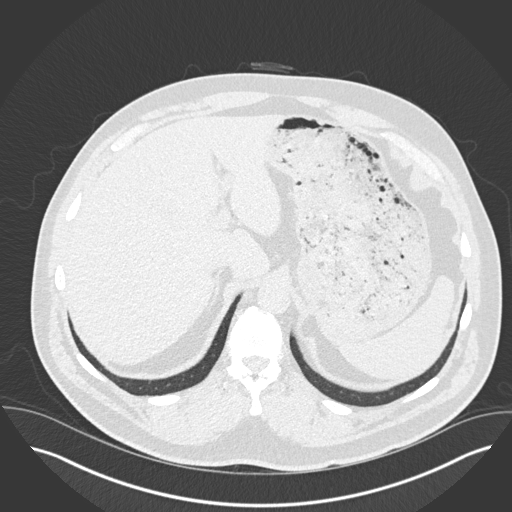
[im 39/166  lung]
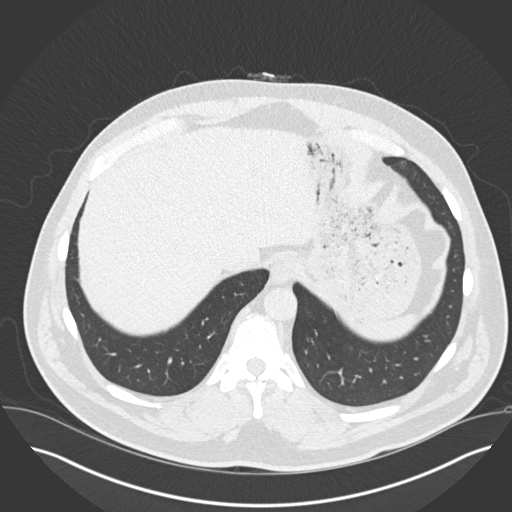
[im 51/166  lung]
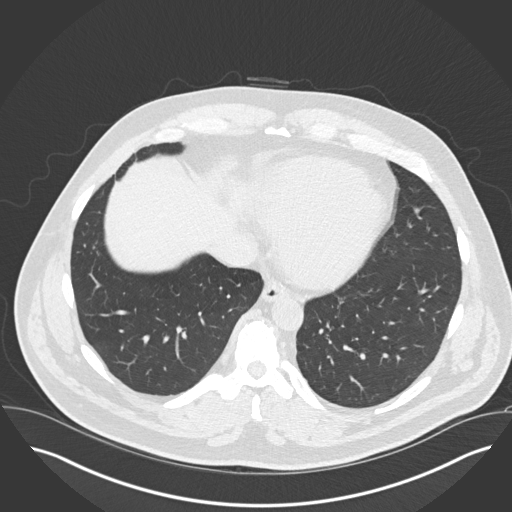
[im 64/166  mediastinal]
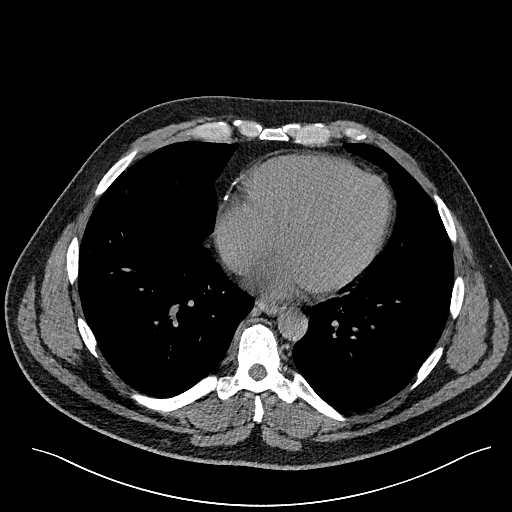
[im 64/166  lung]
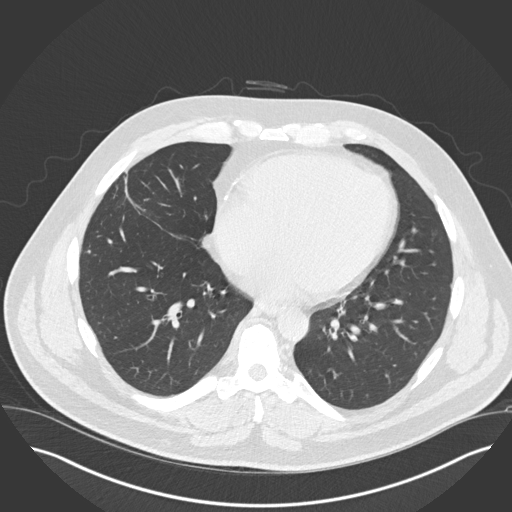
[im 77/166  lung]
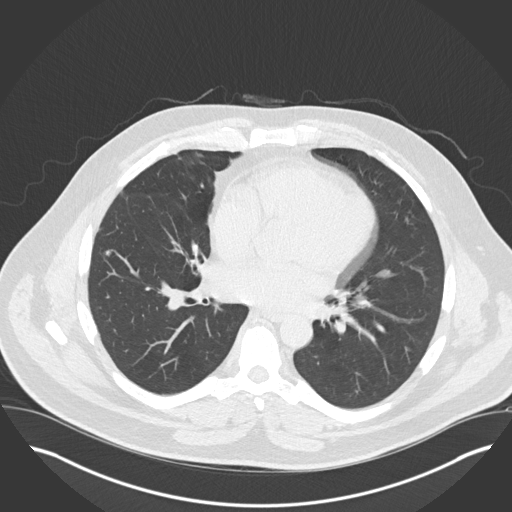
[im 89/166  lung]
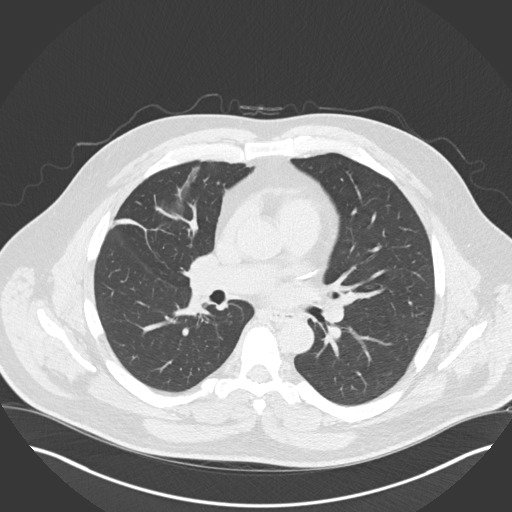
[im 102/166  lung]
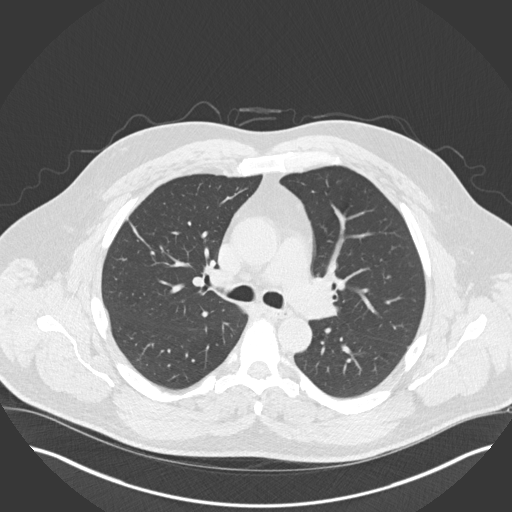
[im 115/166  mediastinal]
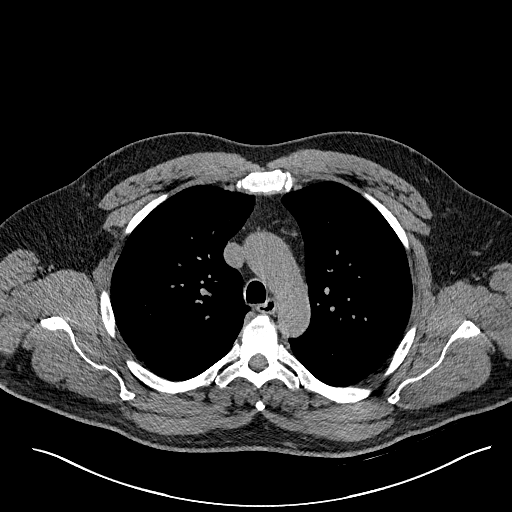
[im 115/166  lung]
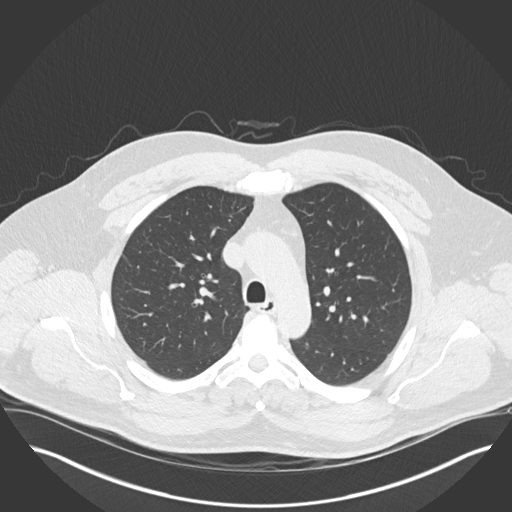
[im 127/166  lung]
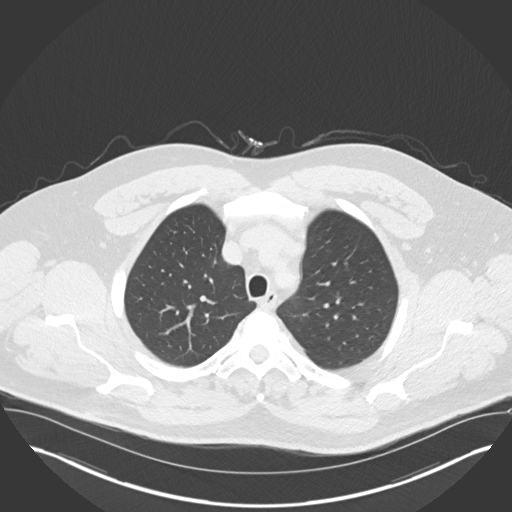
[im 140/166  lung]
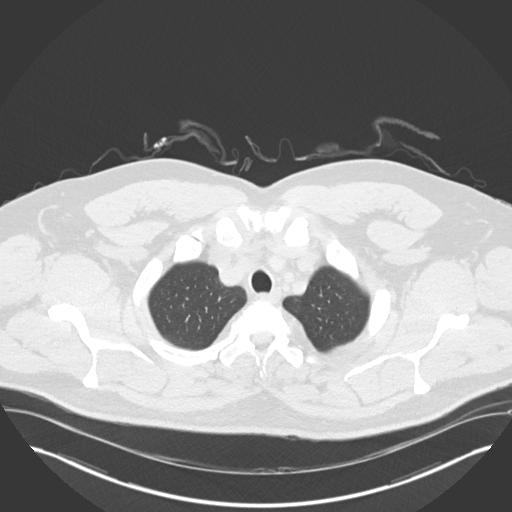
[im 153/166  lung]
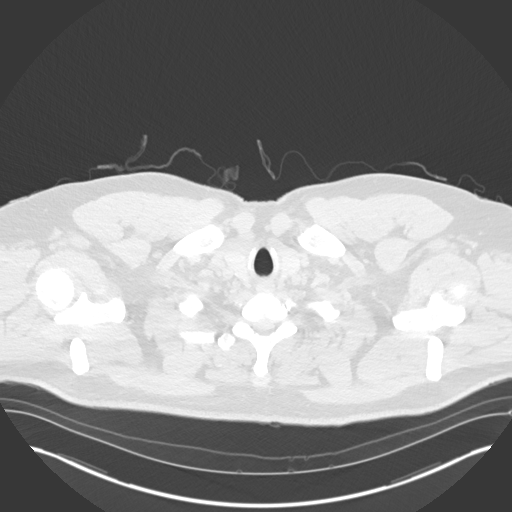

[Series 5: coronal · coronal · 0.65mm/px · 3 of 134 slices shown]
[im 27/134  lung]
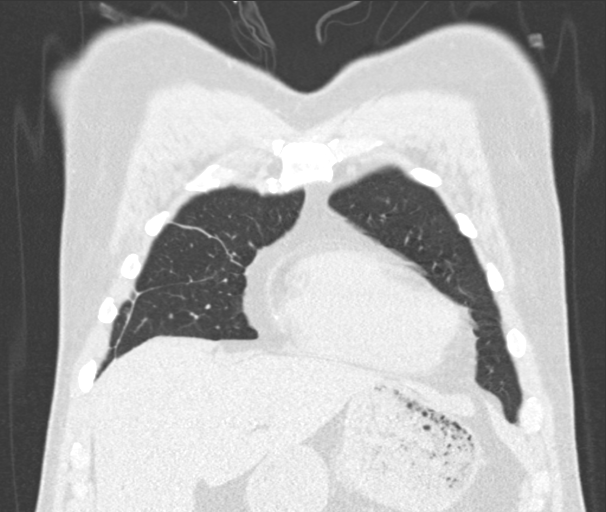
[im 54/134  lung]
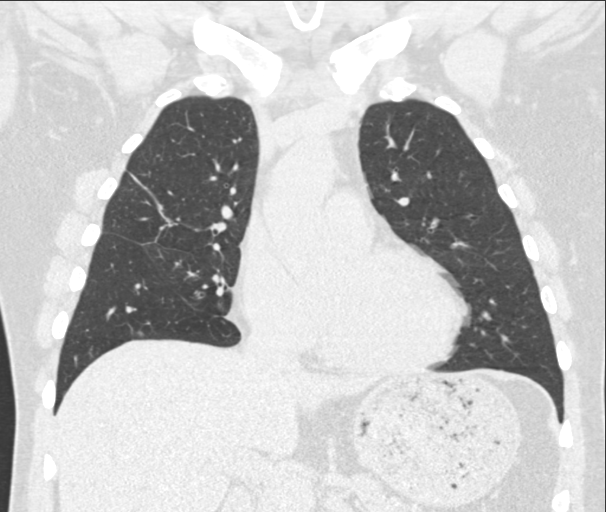
[im 80/134  lung]
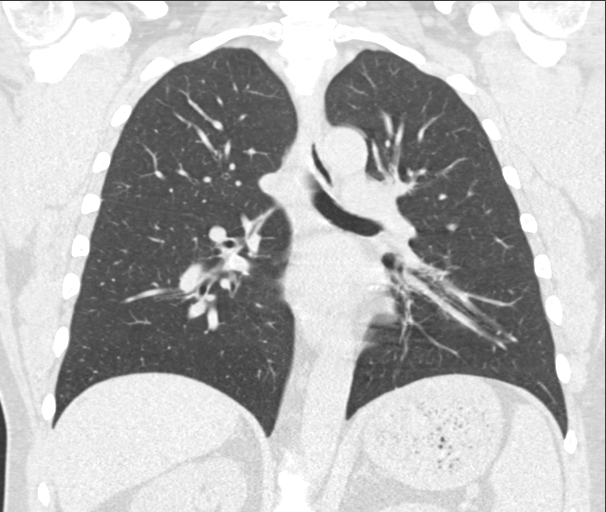

[15 of 36 positions shown; findings below may reference images not displayed]

FINDINGS: Cardiovascular: Moderate calcification within the right coronary
artery. Global cardiac size within normal limits. There are fine
scattered pericardial calcifications, likely postinflammatory,
without CT evidence of cardiac tamponade. The central pulmonary
arteries are of normal caliber. The thoracic aorta is unremarkable.

Mediastinum/Nodes: No enlarged mediastinal or axillary lymph nodes.
Thyroid gland, trachea, and esophagus demonstrate no significant
findings.

Lungs/Pleura: Mild parenchymal scarring within the right middle
lobe. Lungs are otherwise clear. No focal pulmonary nodules or
infiltrates. No pneumothorax or pleural effusion. Central airways
are widely patent.

Upper Abdomen: Cholecystectomy has been performed.

Musculoskeletal: Osseous structures are unremarkable.
IMPRESSION: 1. Previously noted right upper lobe pneumonic consolidation has
essentially resolved with minimal residual parenchymal scarring
remaining. No central obstructing mass. No residual infiltrate.
2. Moderate right coronary artery calcification.
3. Pericardial calcifications, likely postinflammatory, without CT
evidence of cardiac tamponade.

## 2022-04-01 ENCOUNTER — Other Ambulatory Visit: Payer: Self-pay | Admitting: Nurse Practitioner

## 2022-04-01 DIAGNOSIS — R7989 Other specified abnormal findings of blood chemistry: Secondary | ICD-10-CM

## 2022-04-01 DIAGNOSIS — I10 Essential (primary) hypertension: Secondary | ICD-10-CM

## 2022-06-17 ENCOUNTER — Other Ambulatory Visit: Payer: Self-pay | Admitting: Nurse Practitioner

## 2022-06-17 DIAGNOSIS — J4520 Mild intermittent asthma, uncomplicated: Secondary | ICD-10-CM

## 2022-07-31 ENCOUNTER — Other Ambulatory Visit: Payer: Self-pay

## 2022-07-31 ENCOUNTER — Telehealth: Payer: Self-pay | Admitting: Nurse Practitioner

## 2022-07-31 DIAGNOSIS — I10 Essential (primary) hypertension: Secondary | ICD-10-CM

## 2022-07-31 MED ORDER — LISINOPRIL-HYDROCHLOROTHIAZIDE 10-12.5 MG PO TABS: 1.0000 | ORAL_TABLET | Freq: Every day | ORAL | 0 refills | Status: DC

## 2022-07-31 NOTE — Telephone Encounter (Signed)
Rx has been sent  

## 2022-07-31 NOTE — Telephone Encounter (Signed)
Pt scheduled appt and FBW 08/18/22 and 08/25/22.

## 2022-08-17 ENCOUNTER — Other Ambulatory Visit: Payer: Self-pay

## 2022-08-17 DIAGNOSIS — R002 Palpitations: Secondary | ICD-10-CM

## 2022-08-17 DIAGNOSIS — R7989 Other specified abnormal findings of blood chemistry: Secondary | ICD-10-CM

## 2022-08-17 DIAGNOSIS — I10 Essential (primary) hypertension: Secondary | ICD-10-CM

## 2022-08-18 ENCOUNTER — Other Ambulatory Visit: Payer: BC Managed Care – PPO

## 2022-08-18 DIAGNOSIS — R002 Palpitations: Secondary | ICD-10-CM

## 2022-08-18 DIAGNOSIS — I10 Essential (primary) hypertension: Secondary | ICD-10-CM

## 2022-08-18 DIAGNOSIS — R7989 Other specified abnormal findings of blood chemistry: Secondary | ICD-10-CM

## 2022-08-19 LAB — LIPID PANEL
Chol/HDL Ratio: 5.5 ratio — ABNORMAL HIGH (ref 0.0–5.0)
Cholesterol, Total: 186 mg/dL (ref 100–199)
HDL: 34 mg/dL — ABNORMAL LOW (ref >39)
LDL Chol Calc (NIH): 128 mg/dL — ABNORMAL HIGH (ref 0–99)
Triglycerides: 135 mg/dL (ref 0–149)
VLDL Cholesterol Cal: 24 mg/dL (ref 5–40)

## 2022-08-19 LAB — COMPREHENSIVE METABOLIC PANEL
ALT: 22 IU/L (ref 0–44)
AST: 17 IU/L (ref 0–40)
Albumin/Globulin Ratio: 2.1 (ref 1.2–2.2)
Albumin: 4.7 g/dL (ref 3.8–4.9)
Alkaline Phosphatase: 54 IU/L (ref 44–121)
BUN/Creatinine Ratio: 12 (ref 9–20)
BUN: 12 mg/dL (ref 6–24)
Bilirubin Total: 0.8 mg/dL (ref 0.0–1.2)
CO2: 24 mmol/L (ref 20–29)
Calcium: 9.9 mg/dL (ref 8.7–10.2)
Chloride: 102 mmol/L (ref 96–106)
Creatinine, Ser: 1.04 mg/dL (ref 0.76–1.27)
Globulin, Total: 2.2 g/dL (ref 1.5–4.5)
Glucose: 100 mg/dL — ABNORMAL HIGH (ref 70–99)
Potassium: 4 mmol/L (ref 3.5–5.2)
Sodium: 140 mmol/L (ref 134–144)
Total Protein: 6.9 g/dL (ref 6.0–8.5)
eGFR: 86 mL/min/{1.73_m2} (ref >59)

## 2022-08-19 LAB — TESTOSTERONE: Testosterone: >1500 ng/dL — ABNORMAL HIGH (ref 264–916)

## 2022-08-19 LAB — PROLACTIN: Prolactin: 14.2 ng/mL (ref 3.6–25.2)

## 2022-08-19 NOTE — Progress Notes (Signed)
Will discuss at visit 3/15. Suspect labs drawn shortly after testosterone injection.

## 2022-08-24 NOTE — Progress Notes (Signed)
Established patient visit   Patient: Douglas Whitney   DOB: Jul 27, 1970   52 y.o. Male  MRN: [id number] Visit Date: 08/25/2022   Chief Complaint  Patient presents with   Hypertension   Subjective    HPI  Follow up  Low testosterone  -high testosterone on labs done 08/18/2022 - had just had testosterone injection two days prior.  -normal prolactin  -normal CMP  -mild elevation of LDL and Chol/HDL slighlty elevated at 5.5  -hypertension  --generally well controlled though slightly elevated at start of today's visit . --he states that his blood pressure is labile.  -asthma --insurance is not paying for advair or are requesting a PA.  --states that this medication really works to control his asthma  --states that there are some days when he has to use rescue inhaler 3 or 4 times per day.  --he works outside and states that warm days make it more difficult for him to breathe.  --he states that other days, he does not have to use this at all.  --was given prescription for Astelin nasal spray. - states this really helps with allergies.  -He denies chest pain, chest pressure, or shortness of breath. He denies headaches or visual disturbances. He denies abdominal pain, nausea, vomiting, or changes in bowel or bladder habits.    Medications: Outpatient Medications Prior to Visit  Medication Sig   Multiple Vitamin (MULTIVITAMIN WITH MINERALS) TABS tablet Take 1 tablet by mouth daily.   [DISCONTINUED] ADVAIR DISKUS 100-50 MCG/ACT AEPB Inhale 1 puff into the lungs 2 (two) times daily.   [DISCONTINUED] albuterol (VENTOLIN HFA) 108 (90 Base) MCG/ACT inhaler INHALE 1 TO 2 PUFFS INTO THE LUNGS EVERY 6 HOURS AS NEEDED FOR WHEEZING OR SHORTNESS OF BREATH.   [DISCONTINUED] lisinopril-hydrochlorothiazide (ZESTORETIC) 10-12.5 MG tablet Take 1 tablet by mouth daily.   cefUROXime (CEFTIN) 500 MG tablet Take 1 tablet (500 mg total) by mouth 2 (two) times daily with a meal.   clotrimazole-betamethasone  (LOTRISONE) cream Apply 1 Application topically 2 (two) times daily.   [DISCONTINUED] testosterone cypionate (DEPOTESTOSTERONE CYPIONATE) 200 MG/ML injection INJECT (1 MILLILITER) 200 MG SUBCUTANEOUSLY ONCE WEEKLY   No facility-administered medications prior to visit.    Review of Systems See HPI     Last CBC Lab Results  Component Value Date   WBC 10.1 06/17/2021   HGB 16.2 06/17/2021   HCT 47.6 06/17/2021   MCV 93 06/17/2021   MCH 31.6 06/17/2021   RDW 12.3 06/17/2021   PLT 362 06/17/2021   Last metabolic panel Lab Results  Component Value Date   GLUCOSE 100 (H) 08/18/2022   NA 140 08/18/2022   K 4.0 08/18/2022   CL 102 08/18/2022   CO2 24 08/18/2022   BUN 12 08/18/2022   CREATININE 1.04 08/18/2022   EGFR 86 08/18/2022   CALCIUM 9.9 08/18/2022   PROT 6.9 08/18/2022   ALBUMIN 4.7 08/18/2022   LABGLOB 2.2 08/18/2022   AGRATIO 2.1 08/18/2022   BILITOT 0.8 08/18/2022   ALKPHOS 54 08/18/2022   AST 17 08/18/2022   ALT 22 08/18/2022   ANIONGAP 9 07/06/2019   Last lipids Lab Results  Component Value Date   CHOL 186 08/18/2022   HDL 34 (L) 08/18/2022   LDLCALC 128 (H) 08/18/2022   TRIG 135 08/18/2022   CHOLHDL 5.5 (H) 08/18/2022   Last hemoglobin A1c Lab Results  Component Value Date   HGBA1C 5.3 06/17/2021   Last thyroid functions Lab Results  Component Value Date  TSH 2.100 06/17/2021       Objective     Today's Vitals   08/25/22 0836 08/25/22 0908  BP: (Abnormal) 151/91 (Abnormal) 149/78  Pulse: 79   Temp: 97.6 F (36.4 C)   TempSrc: Oral   SpO2: 97%   Weight: 269 lb 1.3 oz (122.1 kg)   Height: 6\' 4"  (1.93 m)   PainSc: 0-No pain    Body mass index is 32.75 kg/m.  BP Readings from Last 3 Encounters:  09/17/22 136/88  09/16/22 (Abnormal) 150/90  09/09/22 106/72    Wt Readings from Last 3 Encounters:  09/17/22 275 lb 9.6 oz (125 kg)  09/16/22 279 lb (126.6 kg)  09/09/22 271 lb 1.9 oz (123 kg)    Physical Exam Vitals and nursing  note reviewed.  Constitutional:      Appearance: Normal appearance. He is well-developed.  HENT:     Head: Normocephalic and atraumatic.     Nose: Nose normal.     Mouth/Throat:     Mouth: Mucous membranes are moist.     Pharynx: Oropharynx is clear.  Eyes:     Extraocular Movements: Extraocular movements intact.     Conjunctiva/sclera: Conjunctivae normal.     Pupils: Pupils are equal, round, and reactive to light.  Neck:     Vascular: No carotid bruit.  Cardiovascular:     Rate and Rhythm: Normal rate and regular rhythm.     Pulses: Normal pulses.     Heart sounds: Normal heart sounds.  Pulmonary:     Effort: Pulmonary effort is normal.     Breath sounds: Normal breath sounds.  Abdominal:     Palpations: Abdomen is soft.  Musculoskeletal:        General: Normal range of motion.     Cervical back: Normal range of motion and neck supple.  Lymphadenopathy:     Cervical: No cervical adenopathy.  Skin:    General: Skin is warm and dry.     Capillary Refill: Capillary refill takes less than 2 seconds.  Neurological:     General: No focal deficit present.     Mental Status: He is alert and oriented to person, place, and time.  Psychiatric:        Mood and Affect: Mood normal.        Behavior: Behavior normal.        Thought Content: Thought content normal.        Judgment: Judgment normal.      Assessment & Plan     Problem List Items Addressed This Visit       Cardiovascular and Mediastinum   Essential hypertension - Primary    Stable blood pressure. Continue diltiazem as prescribed       New onset atrial fibrillation    Now on diltiazem. Referred to cardiology for further evaluation         Respiratory   Mild intermittent asthma without complication    Use advair twice daily  Use rescue inhaler as needed and as prescribed       Relevant Medications   albuterol (VENTOLIN HFA) 108 (90 Base) MCG/ACT inhaler   Non-seasonal allergic rhinitis due to pollen    Relevant Medications   azelastine (ASTELIN) 0.1 % nasal spray     Other   Low testosterone    Doing well on current dose of testosterone.         Return in about 3 months (around 11/25/2022) for health maintenance exam, blood pressure, FBW with  testosterone a week prior to visit.         Carlean Jews, NP  [facility] [phone] (phone) [phone] (fax)  [facility]

## 2022-08-25 ENCOUNTER — Telehealth: Payer: Self-pay

## 2022-08-25 ENCOUNTER — Ambulatory Visit: Payer: BC Managed Care – PPO | Admitting: Nurse Practitioner

## 2022-08-25 ENCOUNTER — Encounter: Payer: Self-pay | Admitting: Nurse Practitioner

## 2022-08-25 VITALS — BP 149/78 | HR 79 | Temp 97.6°F | Ht 76.0 in | Wt 269.1 lb

## 2022-08-25 DIAGNOSIS — J301 Allergic rhinitis due to pollen: Secondary | ICD-10-CM

## 2022-08-25 DIAGNOSIS — J4520 Mild intermittent asthma, uncomplicated: Secondary | ICD-10-CM

## 2022-08-25 DIAGNOSIS — I10 Essential (primary) hypertension: Secondary | ICD-10-CM

## 2022-08-25 DIAGNOSIS — R7989 Other specified abnormal findings of blood chemistry: Secondary | ICD-10-CM | POA: Diagnosis not present

## 2022-08-25 DIAGNOSIS — I4891 Unspecified atrial fibrillation: Secondary | ICD-10-CM

## 2022-08-25 MED ORDER — AZELASTINE HCL 0.1 % NA SOLN: 2.0000 | Freq: Two times a day (BID) | NASAL | 5 refills | Status: DC

## 2022-08-25 MED ORDER — ALBUTEROL SULFATE HFA 108 (90 BASE) MCG/ACT IN AERS: INHALATION_SPRAY | RESPIRATORY_TRACT | 5 refills | Status: DC

## 2022-08-25 MED ORDER — LISINOPRIL-HYDROCHLOROTHIAZIDE 10-12.5 MG PO TABS: 1.0000 | ORAL_TABLET | Freq: Every day | ORAL | 1 refills | Status: DC

## 2022-08-25 MED ORDER — ADVAIR DISKUS 100-50 MCG/ACT IN AEPB: 1.0000 | INHALATION_SPRAY | Freq: Two times a day (BID) | RESPIRATORY_TRACT | 1 refills | Status: DC

## 2022-08-25 NOTE — Telephone Encounter (Signed)
This message was received through Cover My Meds.    The patient's drug benefit plan provides coverage for other drugs which may be considered for treating your patient. Can your patient be treated with a formulary drug? Available Formulary Alternatives: fluticasone-salmeterol (except certain NDCs), Wixela Inhub, BREO ELLIPTA (except certain NDCs) [NOTE: If yes, provide your patient with a new prescription for the formulary product.]*

## 2022-08-26 ENCOUNTER — Other Ambulatory Visit: Payer: Self-pay | Admitting: Nurse Practitioner

## 2022-08-26 DIAGNOSIS — J4520 Mild intermittent asthma, uncomplicated: Secondary | ICD-10-CM

## 2022-08-26 MED ORDER — FLUTICASONE FUROATE-VILANTEROL 100-25 MCG/ACT IN AEPB: 1.0000 | INHALATION_SPRAY | Freq: Every day | RESPIRATORY_TRACT | 5 refills | Status: DC

## 2022-08-26 NOTE — Telephone Encounter (Signed)
I think we talked about this earlier. I changed this to [facility] and sent new prescription to his pharmacy.

## 2022-08-26 NOTE — Telephone Encounter (Signed)
Ok thanks 

## 2022-09-09 ENCOUNTER — Ambulatory Visit: Payer: BC Managed Care – PPO | Admitting: Nurse Practitioner

## 2022-09-09 ENCOUNTER — Encounter: Payer: Self-pay | Admitting: Nurse Practitioner

## 2022-09-09 VITALS — BP 106/72 | HR 94 | Ht 76.0 in | Wt 271.1 lb

## 2022-09-09 DIAGNOSIS — I10 Essential (primary) hypertension: Secondary | ICD-10-CM

## 2022-09-09 DIAGNOSIS — R002 Palpitations: Secondary | ICD-10-CM

## 2022-09-09 DIAGNOSIS — I4891 Unspecified atrial fibrillation: Secondary | ICD-10-CM | POA: Diagnosis not present

## 2022-09-09 DIAGNOSIS — I4819 Other persistent atrial fibrillation: Secondary | ICD-10-CM | POA: Insufficient documentation

## 2022-09-09 MED ORDER — DILTIAZEM HCL 60 MG PO TABS: 60.0000 mg | ORAL_TABLET | Freq: Three times a day (TID) | ORAL | 1 refills | Status: DC

## 2022-09-09 MED ORDER — APIXABAN 2.5 MG PO TABS: 2.5000 mg | ORAL_TABLET | Freq: Two times a day (BID) | ORAL | 1 refills | Status: DC

## 2022-09-09 NOTE — Progress Notes (Signed)
Established patient visit   Patient: Douglas Whitney   DOB: September 01, 1970   52 y.o. Male  MRN: [id number] Visit Date: 09/09/2022  Chief Complaint  Patient presents with   Anxiety   Subjective    Palpitations  This is a recurrent problem. The current episode started 1 to 4 weeks ago. The problem occurs intermittently. The problem has been waxing and waning. The symptoms are aggravated by unknown. Associated symptoms include anxiety, chest fullness, dizziness and an irregular heartbeat. Pertinent negatives include no nausea, numbness, shortness of breath, syncope or vomiting. He has tried breathing exercises for the symptoms. Risk factors include family history and being male.      Medications: Outpatient Medications Prior to Visit  Medication Sig   albuterol (VENTOLIN HFA) 108 (90 Base) MCG/ACT inhaler INHALE 1 TO 2 PUFFS INTO THE LUNGS EVERY 6 HOURS AS NEEDED FOR WHEEZING OR SHORTNESS OF BREATH.   azelastine (ASTELIN) 0.1 % nasal spray Place 2 sprays into both nostrils 2 (two) times daily. Use in each nostril as directed   cefUROXime (CEFTIN) 500 MG tablet Take 1 tablet (500 mg total) by mouth 2 (two) times daily with a meal.   clotrimazole-betamethasone (LOTRISONE) cream Apply 1 Application topically 2 (two) times daily.   fluticasone furoate-vilanterol (BREO ELLIPTA) 100-25 MCG/ACT AEPB Inhale 1 puff into the lungs daily.   lisinopril-hydrochlorothiazide (ZESTORETIC) 10-12.5 MG tablet Take 1 tablet by mouth daily.   Multiple Vitamin (MULTIVITAMIN WITH MINERALS) TABS tablet Take 1 tablet by mouth daily.   testosterone cypionate (DEPOTESTOSTERONE CYPIONATE) 200 MG/ML injection INJECT (1 MILLILITER) 200 MG SUBCUTANEOUSLY ONCE WEEKLY   No facility-administered medications prior to visit.    Review of Systems  Respiratory:  Negative for shortness of breath.   Cardiovascular:  Positive for palpitations. Negative for syncope.  Gastrointestinal:  Negative for nausea and vomiting.   Neurological:  Positive for dizziness. Negative for numbness.  Psychiatric/Behavioral:  The patient is nervous/anxious.      Objective     Today's Vitals   09/09/22 1123  BP: 106/72  Pulse: 94  SpO2: 96%  Weight: 271 lb 1.9 oz (123 kg)  Height: 6\' 4"  (1.93 m)   Body mass index is 33 kg/m.   Physical Exam Vitals and nursing note reviewed.  Constitutional:      Appearance: Normal appearance. He is well-developed.  HENT:     Head: Normocephalic and atraumatic.     Nose: Nose normal.     Mouth/Throat:     Mouth: Mucous membranes are moist.     Pharynx: Oropharynx is clear.  Eyes:     Extraocular Movements: Extraocular movements intact.     Conjunctiva/sclera: Conjunctivae normal.     Pupils: Pupils are equal, round, and reactive to light.  Neck:     Vascular: No carotid bruit.  Cardiovascular:     Rate and Rhythm: Normal rate. Rhythm irregular.     Pulses: Normal pulses.     Heart sounds: Normal heart sounds.     Comments: ECG positive for Atrial fibrillation  Pulmonary:     Effort: Pulmonary effort is normal.     Breath sounds: Normal breath sounds.  Abdominal:     Palpations: Abdomen is soft.  Musculoskeletal:        General: Normal range of motion.     Cervical back: Normal range of motion and neck supple.  Lymphadenopathy:     Cervical: No cervical adenopathy.  Skin:    General: Skin is warm and dry.  Capillary Refill: Capillary refill takes less than 2 seconds.  Neurological:     General: No focal deficit present.     Mental Status: He is alert and oriented to person, place, and time.  Psychiatric:        Mood and Affect: Mood normal.        Behavior: Behavior normal.        Thought Content: Thought content normal.        Judgment: Judgment normal.       Assessment & Plan     New onset atrial fibrillation Assessment & Plan: ECG done during today's visit positive for atrial fibrillation. D/C lisinopril/HCTZ. Start diltiazem 60 mg TID. Add  eliquis 2.5 mg twice daily. Urgent referral placed to cardiology today.   Orders: -     dilTIAZem HCl; Take 1 tablet (60 mg total) by mouth 3 (three) times daily.  Dispense: 90 tablet; Refill: 1 -     Apixaban; Take 1 tablet (2.5 mg total) by mouth 2 (two) times daily.  Dispense: 60 tablet; Refill: 1 -     EKG 12-Lead -     Ambulatory referral to Cardiology  Palpitation Assessment & Plan: Likely due to new onset of atrial fibrillation. Urgent referral placed to cardiology today   Orders: -     Ambulatory referral to Cardiology  Essential hypertension Assessment & Plan: D/C lisinopril/HCTZ. Start diltiazem 60 mg TID. Urgent referral to cardiology made today   Orders: -     Ambulatory referral to Cardiology     Return in about 1 week (around 09/16/2022) for new onset a fib. will make urgent referral to cards, but he wants to call with nam .        Ronnell Freshwater, NP  [place] [facility] [phone] (phone) [phone] (fax)  [place]

## 2022-09-09 NOTE — Assessment & Plan Note (Signed)
ECG done during today's visit positive for atrial fibrillation. D/C lisinopril/HCTZ. Start diltiazem 60 mg TID. Add eliquis 2.5 mg twice daily. Urgent referral placed to cardiology today.

## 2022-09-09 NOTE — Assessment & Plan Note (Signed)
Likely due to new onset of atrial fibrillation. Urgent referral placed to cardiology today

## 2022-09-09 NOTE — Assessment & Plan Note (Addendum)
D/C lisinopril/HCTZ. Start diltiazem 60 mg TID. Urgent referral to cardiology made today

## 2022-09-14 ENCOUNTER — Telehealth: Payer: Self-pay | Admitting: Cardiology

## 2022-09-14 NOTE — Telephone Encounter (Signed)
Patient is asking to be seen with a [place] dr. Please advise

## 2022-09-15 NOTE — Telephone Encounter (Signed)
Please see below. Patient is asking to be seen by Dr. Jens Som

## 2022-09-16 ENCOUNTER — Encounter: Payer: Self-pay | Admitting: Nurse Practitioner

## 2022-09-16 ENCOUNTER — Ambulatory Visit (INDEPENDENT_AMBULATORY_CARE_PROVIDER_SITE_OTHER): Payer: BC Managed Care – PPO | Admitting: Nurse Practitioner

## 2022-09-16 VITALS — BP 150/90 | HR 87 | Ht 76.0 in | Wt 279.0 lb

## 2022-09-16 DIAGNOSIS — J22 Unspecified acute lower respiratory infection: Secondary | ICD-10-CM

## 2022-09-16 DIAGNOSIS — R600 Localized edema: Secondary | ICD-10-CM | POA: Diagnosis not present

## 2022-09-16 DIAGNOSIS — I4891 Unspecified atrial fibrillation: Secondary | ICD-10-CM

## 2022-09-16 DIAGNOSIS — I10 Essential (primary) hypertension: Secondary | ICD-10-CM | POA: Diagnosis not present

## 2022-09-16 MED ORDER — HYDROCHLOROTHIAZIDE 12.5 MG PO TABS: 12.5000 mg | ORAL_TABLET | Freq: Every day | ORAL | 3 refills | Status: DC

## 2022-09-16 MED ORDER — DOXYCYCLINE HYCLATE 100 MG PO TABS: 100.0000 mg | ORAL_TABLET | Freq: Two times a day (BID) | ORAL | 0 refills | Status: DC

## 2022-09-16 MED ORDER — DILTIAZEM HCL 90 MG PO TABS: 90.0000 mg | ORAL_TABLET | Freq: Three times a day (TID) | ORAL | 1 refills | Status: DC

## 2022-09-16 NOTE — Progress Notes (Unsigned)
Cardiology Clinic Note   Patient Name: JAHSEAN CAIL Date of Encounter: 09/17/2022  Primary Care Provider:  Carlean Jews, NP Primary Cardiologist:  Olga Millers, MD  Patient Profile    FOSTER LORD presents to the clinic today for follow-up evaluation of his hypertension.  Past Medical History    Past Medical History:  Diagnosis Date   Acid reflux    Hypertension    Seasonal allergies    Sinusitis    Snores    Wears glasses    Past Surgical History:  Procedure Laterality Date   CHOLECYSTECTOMY N/A 08/01/2015   Procedure: LAPAROSCOPIC CHOLECYSTECTOMY WITH INTRAOPERATIVE CHOLANGIOGRAM;  Surgeon: Darnell Level, MD;  Location: [facility] OR;  Service: General;  Laterality: N/A;   ESOPHAGOGASTRODUODENOSCOPY  2017   FINGER AMPUTATION Left 1983   cut off tip index finger   FRACTURE SURGERY     I & D EXTREMITY Left 05/06/2018   Procedure: IRRIGATION AND DEBRIDEMENT SMALL FINGER;  Surgeon: Teryl Lucy, MD;  Location: [facility] OR;  Service: Orthopedics;  Laterality: Left;   JOINT REPLACEMENT     NASAL SEPTOPLASTY W/ TURBINOPLASTY Bilateral 04/14/2013   Procedure: NASAL SEPTOPLASTY BILATERAL INFERIOR TURBINATE REDUCTION ;  Surgeon: Osborn Coho, MD;  Location: [facility];  Service: ENT;  Laterality: Bilateral;   ORIF FINGER FRACTURE Left 1984   middle   TOTAL HIP ARTHROPLASTY Right 03/2016   WISDOM TOOTH EXTRACTION      Allergies  No Known Allergies  History of Present Illness    KRYSTLE MAHESHWARI is a PMH of new onset atrial fibrillation, hypertension, and palpitations.  He was seen in follow-up by Dr. Azucena Cecil on 03/04/2021.  During that time he was continued on his lisinopril/HCTZ regimen.  His blood pressure was well-controlled.  Low-sodium diet was reviewed.  He had worn a cardiac event monitor which showed no evidence of atrial fibrillation.  He reported that his palpitations had improved.  He was seen by his PCP on 09/16/2022.  He was noted to be  in atrial fibrillation, have lower respiratory infection, and have elevated blood pressure.  He was started on doxycycline, diltiazem, HCTZ 12.5, and his lisinopril was stopped.  Cardiology consult was recommended.  He presents to the clinic today for follow-up evaluation and states he recently was diagnosed with upper respiratory tract infection.  He started doxycycline yesterday.  At his PCP he was noted to be in atrial fibrillation.  He was started on low-dose apixaban at that time as well as immediate release diltiazem.  We reviewed his medications.  I instructed him to complete his antibiotics.  We reviewed options for treatment of his atrial fibrillation.  He and his daughter-in-law expressed understanding.  I will change his diltiazem to extended release 300 mg daily and increase his apixaban to 5 mg twice daily.  We reviewed triggers for atrial fibrillation.  I will plan cardioversion after 3 weeks of anticoagulation and plan follow-up for after DCCV.  He was instructed to contact the office if he does not recover from his URI so that his DCCV may be postponed.  Today he denies chest pain,  fatigue, palpitations, melena, hematuria, hemoptysis, diaphoresis, weakness, presyncope, syncope, orthopnea, and PND.    Home Medications    Prior to Admission medications   Medication Sig Start Date End Date Taking? Authorizing Provider  albuterol (VENTOLIN HFA) 108 (90 Base) MCG/ACT inhaler INHALE 1 TO 2 PUFFS INTO THE LUNGS EVERY 6 HOURS AS NEEDED FOR WHEEZING OR SHORTNESS  OF BREATH. 08/25/22   Carlean JewsBoscia, Heather E, NP  apixaban (ELIQUIS) 2.5 MG TABS tablet Take 1 tablet (2.5 mg total) by mouth 2 (two) times daily. 09/09/22   Carlean JewsBoscia, Heather E, NP  azelastine (ASTELIN) 0.1 % nasal spray Place 2 sprays into both nostrils 2 (two) times daily. Use in each nostril as directed 08/25/22   Carlean JewsBoscia, Heather E, NP  cefUROXime (CEFTIN) 500 MG tablet Take 1 tablet (500 mg total) by mouth 2 (two) times daily with a meal.  08/25/21   Carlean JewsBoscia, Heather E, NP  clotrimazole-betamethasone (LOTRISONE) cream Apply 1 Application topically 2 (two) times daily. 12/15/21   Carlean JewsBoscia, Heather E, NP  diltiazem (CARDIZEM) 90 MG tablet Take 1 tablet (90 mg total) by mouth 3 (three) times daily. 09/16/22   Carlean JewsBoscia, Heather E, NP  doxycycline (VIBRA-TABS) 100 MG tablet Take 1 tablet (100 mg total) by mouth 2 (two) times daily. 09/16/22   Carlean JewsBoscia, Heather E, NP  fluticasone furoate-vilanterol (BREO ELLIPTA) 100-25 MCG/ACT AEPB Inhale 1 puff into the lungs daily. 08/26/22   Carlean JewsBoscia, Heather E, NP  hydrochlorothiazide (HYDRODIURIL) 12.5 MG tablet Take 1 tablet (12.5 mg total) by mouth daily. 09/16/22   Carlean JewsBoscia, Heather E, NP  Multiple Vitamin (MULTIVITAMIN WITH MINERALS) TABS tablet Take 1 tablet by mouth daily.    [provider]  testosterone cypionate (DEPOTESTOSTERONE CYPIONATE) 200 MG/ML injection INJECT (1 MILLILITER) 200 MG SUBCUTANEOUSLY ONCE WEEKLY 04/02/22   Carlean JewsBoscia, Heather E, NP    Family History    Family History  Problem Relation Age of Onset   High blood pressure Father    Stroke Other    He indicated that the status of his father is unknown. He indicated that the status of his other is unknown.  Social History    Social History   Socioeconomic History   Marital status: Married    Spouse name: Not on file   Number of children: Not on file   Years of education: Not on file   Highest education level: Not on file  Occupational History   Not on file  Tobacco Use   Smoking status: Former    Packs/day: 2.00    Years: 6.00    Additional pack years: 0.00    Total pack years: 12.00    Types: Cigarettes    Start date: 231992    Quit date: 1998    Years since quitting: 26.2   Smokeless tobacco: Former    Types: Chew, Snuff    Quit date: 1999  Vaping Use   Vaping Use: Never used  Substance and Sexual Activity   Alcohol use: Never    Comment: 05/05/2018 "maybe 2 drinks/year"   Drug use: No   Sexual activity:  Yes    Partners: Female  Other Topics Concern   Not on file  Social History Narrative   Not on file   Social Determinants of Health   Financial Resource Strain: Not on file  Food Insecurity: Not on file  Transportation Needs: Not on file  Physical Activity: Not on file  Stress: Not on file  Social Connections: Not on file  Intimate Partner Violence: Not on file     Review of Systems    General:  No chills, fever, night sweats or weight changes.  Cardiovascular:  No chest pain, dyspnea on exertion, edema, orthopnea, palpitations, paroxysmal nocturnal dyspnea. Dermatological: No rash, lesions/masses Respiratory: No cough, dyspnea Urologic: No hematuria, dysuria Abdominal:   No nausea, vomiting, diarrhea, bright red blood per rectum,  melena, or hematemesis Neurologic:  No visual changes, wkns, changes in mental status. All other systems reviewed and are otherwise negative except as noted above.  Physical Exam    VS:  BP 136/88   Pulse 73   Ht 6\' 4"  (1.93 m)   Wt 275 lb 9.6 oz (125 kg)   SpO2 96%   BMI 33.55 kg/m  , BMI Body mass index is 33.55 kg/m. GEN: Well nourished, well developed, in no acute distress. HEENT: normal. Neck: Supple, no JVD, carotid bruits, or masses. Cardiac: Irregularly irregular no murmurs, rubs, or gallops. No clubbing, cyanosis, edema.  Radials/DP/PT 2+ and equal bilaterally.  Respiratory:  Respirations regular and unlabored, clear to auscultation bilaterally. GI: Soft, nontender, nondistended, BS + x 4. MS: no deformity or atrophy. Skin: warm and dry, no rash. Neuro:  Strength and sensation are intact. Psych: Normal affect.  Accessory Clinical Findings    Recent Labs: 08/18/2022: ALT 22; BUN 12; Creatinine, Ser 1.04; Potassium 4.0; Sodium 140   Recent Lipid Panel    Component Value Date/Time   CHOL 186 08/18/2022 0854   TRIG 135 08/18/2022 0854   HDL 34 (L) 08/18/2022 0854   CHOLHDL 5.5 (H) 08/18/2022 0854   LDLCALC 128 (H) 08/18/2022  0854         ECG personally reviewed by me today-atrial fibrillation 73 bpm  Cardiac event monitor 01/16/2021  Patch Wear Time:  2 days and 11 hours (2022-08-05T09:55:23-0400 to 2022-08-07T21:22:15-0400)   Patient had a min HR of 48 bpm, max HR of 141 bpm, and avg HR of 85 bpm. Predominant underlying rhythm was Sinus Rhythm. Isolated SVEs were rare (<1.0%), SVE Triplets were rare (<1.0%), and no SVE Couplets were present. Isolated VEs were rare (<1.0%),  and no VE Couplets or VE Triplets were present.  No evidence of arrhythmias.  Patient triggered events associated with sinus rhythm, sinus tachycardia, overall benign cardiac monitor.  Assessment & Plan   1.  Atrial fibrillation, palpitations-EKG today shows atrial fibrillation 73 bpm.  EKG at PCP yesterday showed atrial fibrillation.  He was started on diltiazem.  Suspect his respiratory infection contributed to transition to atrial fibrillation.  Reviewed options for A-fib management.  Patient wishes to proceed with DCCV.  CHA2DS2-VASc score 1. This patients CHA2DS2-VASc Score and unadjusted Ischemic Stroke Rate (% per year) is equal to 0.6 % stroke rate/year from a score of 1 Above score calculated as 1 point each if present [, HTN, ) Change diltiazem to 300 mg extended release Start apixaban 5 mg twice daily. Schedule DCCV. Avoid triggers  Case discussed with Dr. Jens Som DOD Order CBC CMP prior to cardioversion  Shared Decision Making/Informed Consent  The risks (stroke, cardiac arrhythmias rarely resulting in the need for a temporary or permanent pacemaker, skin irritation or burns and complications associated with conscious sedation including aspiration, arrhythmia, respiratory failure and death), benefits (restoration of normal sinus rhythm) and alternatives of a direct current cardioversion were explained in detail to Mr. Stephani and he agrees to proceed.     Essential hypertension-BP today 136/86 Continue current medication  regimen Heart healthy low-sodium diet  Disposition: Follow-up with Dr. Jens Som or me post DCCV.   Thomasene Ripple. Keyairra Kolinski NP-C     09/17/2022, 12:31 PM [facility] [address] Office [phone] Fax [phone]    I spent 15 minutes examining this patient, reviewing medications, and using patient centered shared decision making involving her cardiac care.  Prior to her visit I  spent greater than 20 minutes reviewing her past medical history,  medications, and prior cardiac tests.

## 2022-09-16 NOTE — Progress Notes (Signed)
Established patient visit   Patient: Douglas Whitney   DOB: 1970-09-09   52 y.o. Male  MRN: [id number] Visit Date: 09/16/2022   Chief Complaint  Patient presents with   Medical Management of Chronic Issues   Subjective    HPI  Follow up -recent diagnosis with atrial fibrillation.  -has not yet been seen by cardiology  Feeling better than when last seen  Denies chest pain, chest pressure, or shortness of breath.  -blood pressure remains elevated  -has noted some swelling in both feet since discontinuing HCTZ.   Sinusitis. -nasal congestion -headache  -eears feel full  -cough -denies fever, chills, nausea, or vomiting.    Medications: Outpatient Medications Prior to Visit  Medication Sig   albuterol (VENTOLIN HFA) 108 (90 Base) MCG/ACT inhaler INHALE 1 TO 2 PUFFS INTO THE LUNGS EVERY 6 HOURS AS NEEDED FOR WHEEZING OR SHORTNESS OF BREATH.   azelastine (ASTELIN) 0.1 % nasal spray Place 2 sprays into both nostrils 2 (two) times daily. Use in each nostril as directed   fluticasone furoate-vilanterol (BREO ELLIPTA) 100-25 MCG/ACT AEPB Inhale 1 puff into the lungs daily. (Patient taking differently: Inhale 1 puff into the lungs 2 (two) times daily.)   Multiple Vitamin (MULTIVITAMIN WITH MINERALS) TABS tablet Take 1 tablet by mouth daily.   [DISCONTINUED] apixaban (ELIQUIS) 2.5 MG TABS tablet Take 1 tablet (2.5 mg total) by mouth 2 (two) times daily.   [DISCONTINUED] cefUROXime (CEFTIN) 500 MG tablet Take 1 tablet (500 mg total) by mouth 2 (two) times daily with a meal. (Patient not taking: Reported on 10/06/2022)   [DISCONTINUED] clotrimazole-betamethasone (LOTRISONE) cream Apply 1 Application topically 2 (two) times daily. (Patient not taking: Reported on 10/06/2022)   [DISCONTINUED] diltiazem (CARDIZEM) 60 MG tablet Take 1 tablet (60 mg total) by mouth 3 (three) times daily.   [DISCONTINUED] lisinopril-hydrochlorothiazide (ZESTORETIC) 10-12.5 MG tablet Take 1 tablet by mouth daily.    [DISCONTINUED] testosterone cypionate (DEPOTESTOSTERONE CYPIONATE) 200 MG/ML injection INJECT (1 MILLILITER) 200 MG SUBCUTANEOUSLY ONCE WEEKLY   No facility-administered medications prior to visit.    Review of Systems See HPI    Last CBC Lab Results  Component Value Date   WBC 7.4 10/02/2022   HGB 18.9 (H) 10/02/2022   HCT 56.2 (H) 10/02/2022   MCV 94 10/02/2022   MCH 31.4 10/02/2022   RDW 12.9 10/02/2022   PLT 312 10/02/2022   Last metabolic panel Lab Results  Component Value Date   GLUCOSE 98 10/02/2022   NA 139 10/02/2022   K 4.3 10/02/2022   CL 100 10/02/2022   CO2 24 10/02/2022   BUN 13 10/02/2022   CREATININE 1.02 10/02/2022   EGFR 88 10/02/2022   CALCIUM 10.2 10/02/2022   PROT 7.1 10/02/2022   ALBUMIN 4.8 10/02/2022   LABGLOB 2.3 10/02/2022   AGRATIO 2.1 10/02/2022   BILITOT 1.2 10/02/2022   ALKPHOS 57 10/02/2022   AST 20 10/02/2022   ALT 26 10/02/2022   ANIONGAP 9 07/06/2019   Last lipids Lab Results  Component Value Date   CHOL 186 08/18/2022   HDL 34 (L) 08/18/2022   LDLCALC 128 (H) 08/18/2022   TRIG 135 08/18/2022   CHOLHDL 5.5 (H) 08/18/2022   Last hemoglobin A1c Lab Results  Component Value Date   HGBA1C 5.3 06/17/2021   Last thyroid functions Lab Results  Component Value Date   TSH 2.100 06/17/2021        Objective     Today's Vitals   09/16/22 1531 09/16/22 1540  BP: (Abnormal) 160/99 (Abnormal) 150/90  Pulse: 87   SpO2: 98%   Weight: 279 lb (126.6 kg)   Height: 6\' 4"  (1.93 m)    Body mass index is 33.96 kg/m.  BP Readings from Last 3 Encounters:  10/14/22 (Abnormal) 142/88  10/07/22 (Abnormal) 139/93  09/17/22 136/88    Wt Readings from Last 3 Encounters:  10/14/22 270 lb (122.5 kg)  10/07/22 270 lb (122.5 kg)  09/17/22 275 lb 9.6 oz (125 kg)    Physical Exam Vitals and nursing note reviewed.  Constitutional:      Appearance: Normal appearance. He is not ill-appearing.  HENT:     Head: Normocephalic and  atraumatic.     Right Ear: Tympanic membrane, ear canal and external ear normal.     Left Ear: Tympanic membrane, ear canal and external ear normal.     Nose: Congestion and rhinorrhea present.     Right Sinus: Maxillary sinus tenderness and frontal sinus tenderness present.     Left Sinus: Maxillary sinus tenderness and frontal sinus tenderness present.     Mouth/Throat:     Mouth: Mucous membranes are moist.     Pharynx: Oropharynx is clear. Posterior oropharyngeal erythema present.     Tonsils: No tonsillar exudate or tonsillar abscesses.  Eyes:     Extraocular Movements: Extraocular movements intact.     Conjunctiva/sclera: Conjunctivae normal.     Pupils: Pupils are equal, round, and reactive to light.  Cardiovascular:     Rate and Rhythm: Normal rate. Rhythm irregular.     Pulses: Normal pulses.     Heart sounds: Normal heart sounds.     Comments: Ild swelling in bilateral feet.  Pulmonary:     Effort: Pulmonary effort is normal. No respiratory distress.     Breath sounds: Wheezing present.     Comments: Congested, non-productive cough  Musculoskeletal:        General: Normal range of motion.     Cervical back: Normal range of motion and neck supple.  Lymphadenopathy:     Cervical: No cervical adenopathy.  Skin:    General: Skin is warm and dry.     Capillary Refill: Capillary refill takes less than 2 seconds.  Neurological:     General: No focal deficit present.     Mental Status: He is alert and oriented to person, place, and time.  Psychiatric:        Mood and Affect: Mood normal.        Behavior: Behavior normal.        Thought Content: Thought content normal.        Judgment: Judgment normal.       Assessment & Plan    New onset atrial fibrillation (HCC) Assessment & Plan: Increase diltiazem to 90 mg three times daily..  Continue eliquis twice daily  Contacted cardiology and obtained appointment with cardiology for furhter evaluation and treatment.      Essential hypertension Assessment & Plan: Increase diltiazem to 90 mg TID.  Add HCTZ 25 mg daily  Continue with lifestyle changes.  Monitor bp at homme. - he understands the recommendation if for his blood pressure to be 130/80 or better   Orders: -     hydroCHLOROthiazide; Take 1 tablet (12.5 mg total) by mouth daily.  Dispense: 90 tablet; Refill: 3  Localized edema Assessment & Plan: Add HCTZ 12.5 mg tablets daily  -increase water and decrease sodium intake.   Orders: -     hydroCHLOROthiazide; Take 1  tablet (12.5 mg total) by mouth daily.  Dispense: 90 tablet; Refill: 3  Lower respiratory infection Assessment & Plan: Start doxycycline 100 mg twice daily for 10 days.  -Rest and increase fluids. Continue using OTC medication to control symptoms.        Return in about 3 weeks (around 10/07/2022) for blood pressure.         Carlean Jews, NP  [facility] [phone] (phone) [phone] (fax)  [facility]

## 2022-09-16 NOTE — H&P (View-Only) (Signed)
Cardiology Clinic Note   Patient Name: Douglas Whitney Date of Encounter: 09/17/2022  Primary Care Provider:  Carlean Jews, NP Primary Cardiologist:  Olga Millers, MD  Patient Profile    Douglas Whitney presents to the clinic today for follow-up evaluation of his hypertension.  Past Medical History    Past Medical History:  Diagnosis Date   Acid reflux    Hypertension    Seasonal allergies    Sinusitis    Snores    Wears glasses    Past Surgical History:  Procedure Laterality Date   CHOLECYSTECTOMY N/A 08/01/2015   Procedure: LAPAROSCOPIC CHOLECYSTECTOMY WITH INTRAOPERATIVE CHOLANGIOGRAM;  Surgeon: Darnell Level, MD;  Location: [facility] OR;  Service: General;  Laterality: N/A;   ESOPHAGOGASTRODUODENOSCOPY  2017   FINGER AMPUTATION Left 1983   cut off tip index finger   FRACTURE SURGERY     I & D EXTREMITY Left 05/06/2018   Procedure: IRRIGATION AND DEBRIDEMENT SMALL FINGER;  Surgeon: Teryl Lucy, MD;  Location: [facility] OR;  Service: Orthopedics;  Laterality: Left;   JOINT REPLACEMENT     NASAL SEPTOPLASTY W/ TURBINOPLASTY Bilateral 04/14/2013   Procedure: NASAL SEPTOPLASTY BILATERAL INFERIOR TURBINATE REDUCTION ;  Surgeon: Osborn Coho, MD;  Location: [facility];  Service: ENT;  Laterality: Bilateral;   ORIF FINGER FRACTURE Left 1984   middle   TOTAL HIP ARTHROPLASTY Right 03/2016   WISDOM TOOTH EXTRACTION      Allergies  No Known Allergies  History of Present Illness    Douglas Whitney is a PMH of new onset atrial fibrillation, hypertension, and palpitations.  He was seen in follow-up by Dr. Azucena Cecil on 03/04/2021.  During that time he was continued on his lisinopril/HCTZ regimen.  His blood pressure was well-controlled.  Low-sodium diet was reviewed.  He had worn a cardiac event monitor which showed no evidence of atrial fibrillation.  He reported that his palpitations had improved.  He was seen by his PCP on 09/16/2022.  He was noted to be  in atrial fibrillation, have lower respiratory infection, and have elevated blood pressure.  He was started on doxycycline, diltiazem, HCTZ 12.5, and his lisinopril was stopped.  Cardiology consult was recommended.  He presents to the clinic today for follow-up evaluation and states he recently was diagnosed with upper respiratory tract infection.  He started doxycycline yesterday.  At his PCP he was noted to be in atrial fibrillation.  He was started on low-dose apixaban at that time as well as immediate release diltiazem.  We reviewed his medications.  I instructed him to complete his antibiotics.  We reviewed options for treatment of his atrial fibrillation.  He and his daughter-in-law expressed understanding.  I will change his diltiazem to extended release 300 mg daily and increase his apixaban to 5 mg twice daily.  We reviewed triggers for atrial fibrillation.  I will plan cardioversion after 3 weeks of anticoagulation and plan follow-up for after DCCV.  He was instructed to contact the office if he does not recover from his URI so that his DCCV may be postponed.  Today he denies chest pain,  fatigue, palpitations, melena, hematuria, hemoptysis, diaphoresis, weakness, presyncope, syncope, orthopnea, and PND.    Home Medications    Prior to Admission medications   Medication Sig Start Date End Date Taking? Authorizing Provider  albuterol (VENTOLIN HFA) 108 (90 Base) MCG/ACT inhaler INHALE 1 TO 2 PUFFS INTO THE LUNGS EVERY 6 HOURS AS NEEDED FOR WHEEZING OR SHORTNESS  OF BREATH. 08/25/22   Carlean JewsBoscia, Heather E, NP  apixaban (ELIQUIS) 2.5 MG TABS tablet Take 1 tablet (2.5 mg total) by mouth 2 (two) times daily. 09/09/22   Carlean JewsBoscia, Heather E, NP  azelastine (ASTELIN) 0.1 % nasal spray Place 2 sprays into both nostrils 2 (two) times daily. Use in each nostril as directed 08/25/22   Carlean JewsBoscia, Heather E, NP  cefUROXime (CEFTIN) 500 MG tablet Take 1 tablet (500 mg total) by mouth 2 (two) times daily with a meal.  08/25/21   Carlean JewsBoscia, Heather E, NP  clotrimazole-betamethasone (LOTRISONE) cream Apply 1 Application topically 2 (two) times daily. 12/15/21   Carlean JewsBoscia, Heather E, NP  diltiazem (CARDIZEM) 90 MG tablet Take 1 tablet (90 mg total) by mouth 3 (three) times daily. 09/16/22   Carlean JewsBoscia, Heather E, NP  doxycycline (VIBRA-TABS) 100 MG tablet Take 1 tablet (100 mg total) by mouth 2 (two) times daily. 09/16/22   Carlean JewsBoscia, Heather E, NP  fluticasone furoate-vilanterol (BREO ELLIPTA) 100-25 MCG/ACT AEPB Inhale 1 puff into the lungs daily. 08/26/22   Carlean JewsBoscia, Heather E, NP  hydrochlorothiazide (HYDRODIURIL) 12.5 MG tablet Take 1 tablet (12.5 mg total) by mouth daily. 09/16/22   Carlean JewsBoscia, Heather E, NP  Multiple Vitamin (MULTIVITAMIN WITH MINERALS) TABS tablet Take 1 tablet by mouth daily.    [provider]  testosterone cypionate (DEPOTESTOSTERONE CYPIONATE) 200 MG/ML injection INJECT (1 MILLILITER) 200 MG SUBCUTANEOUSLY ONCE WEEKLY 04/02/22   Carlean JewsBoscia, Heather E, NP    Family History    Family History  Problem Relation Age of Onset   High blood pressure Father    Stroke Other    He indicated that the status of his father is unknown. He indicated that the status of his other is unknown.  Social History    Social History   Socioeconomic History   Marital status: Married    Spouse name: Not on file   Number of children: Not on file   Years of education: Not on file   Highest education level: Not on file  Occupational History   Not on file  Tobacco Use   Smoking status: Former    Packs/day: 2.00    Years: 6.00    Additional pack years: 0.00    Total pack years: 12.00    Types: Cigarettes    Start date: 231992    Quit date: 1998    Years since quitting: 26.2   Smokeless tobacco: Former    Types: Chew, Snuff    Quit date: 1999  Vaping Use   Vaping Use: Never used  Substance and Sexual Activity   Alcohol use: Never    Comment: 05/05/2018 "maybe 2 drinks/year"   Drug use: No   Sexual activity:  Yes    Partners: Female  Other Topics Concern   Not on file  Social History Narrative   Not on file   Social Determinants of Health   Financial Resource Strain: Not on file  Food Insecurity: Not on file  Transportation Needs: Not on file  Physical Activity: Not on file  Stress: Not on file  Social Connections: Not on file  Intimate Partner Violence: Not on file     Review of Systems    General:  No chills, fever, night sweats or weight changes.  Cardiovascular:  No chest pain, dyspnea on exertion, edema, orthopnea, palpitations, paroxysmal nocturnal dyspnea. Dermatological: No rash, lesions/masses Respiratory: No cough, dyspnea Urologic: No hematuria, dysuria Abdominal:   No nausea, vomiting, diarrhea, bright red blood per rectum,  melena, or hematemesis Neurologic:  No visual changes, wkns, changes in mental status. All other systems reviewed and are otherwise negative except as noted above.  Physical Exam    VS:  BP 136/88   Pulse 73   Ht 6\' 4"  (1.93 m)   Wt 275 lb 9.6 oz (125 kg)   SpO2 96%   BMI 33.55 kg/m  , BMI Body mass index is 33.55 kg/m. GEN: Well nourished, well developed, in no acute distress. HEENT: normal. Neck: Supple, no JVD, carotid bruits, or masses. Cardiac: Irregularly irregular no murmurs, rubs, or gallops. No clubbing, cyanosis, edema.  Radials/DP/PT 2+ and equal bilaterally.  Respiratory:  Respirations regular and unlabored, clear to auscultation bilaterally. GI: Soft, nontender, nondistended, BS + x 4. MS: no deformity or atrophy. Skin: warm and dry, no rash. Neuro:  Strength and sensation are intact. Psych: Normal affect.  Accessory Clinical Findings    Recent Labs: 08/18/2022: ALT 22; BUN 12; Creatinine, Ser 1.04; Potassium 4.0; Sodium 140   Recent Lipid Panel    Component Value Date/Time   CHOL 186 08/18/2022 0854   TRIG 135 08/18/2022 0854   HDL 34 (L) 08/18/2022 0854   CHOLHDL 5.5 (H) 08/18/2022 0854   LDLCALC 128 (H) 08/18/2022  0854         ECG personally reviewed by me today-atrial fibrillation 73 bpm  Cardiac event monitor 01/16/2021  Patch Wear Time:  2 days and 11 hours (2022-08-05T09:55:23-0400 to 2022-08-07T21:22:15-0400)   Patient had a min HR of 48 bpm, max HR of 141 bpm, and avg HR of 85 bpm. Predominant underlying rhythm was Sinus Rhythm. Isolated SVEs were rare (<1.0%), SVE Triplets were rare (<1.0%), and no SVE Couplets were present. Isolated VEs were rare (<1.0%),  and no VE Couplets or VE Triplets were present.  No evidence of arrhythmias.  Patient triggered events associated with sinus rhythm, sinus tachycardia, overall benign cardiac monitor.  Assessment & Plan   1.  Atrial fibrillation, palpitations-EKG today shows atrial fibrillation 73 bpm.  EKG at PCP yesterday showed atrial fibrillation.  He was started on diltiazem.  Suspect his respiratory infection contributed to transition to atrial fibrillation.  Reviewed options for A-fib management.  Patient wishes to proceed with DCCV.  CHA2DS2-VASc score 1. This patients CHA2DS2-VASc Score and unadjusted Ischemic Stroke Rate (% per year) is equal to 0.6 % stroke rate/year from a score of 1 Above score calculated as 1 point each if present [, HTN, ) Change diltiazem to 300 mg extended release Start apixaban 5 mg twice daily. Schedule DCCV. Avoid triggers  Case discussed with Dr. Jens Som DOD Order CBC CMP prior to cardioversion  Shared Decision Making/Informed Consent  The risks (stroke, cardiac arrhythmias rarely resulting in the need for a temporary or permanent pacemaker, skin irritation or burns and complications associated with conscious sedation including aspiration, arrhythmia, respiratory failure and death), benefits (restoration of normal sinus rhythm) and alternatives of a direct current cardioversion were explained in detail to Mr. Stephani and he agrees to proceed.     Essential hypertension-BP today 136/86 Continue current medication  regimen Heart healthy low-sodium diet  Disposition: Follow-up with Dr. Jens Som or me post DCCV.   Thomasene Ripple. Taquana Bartley NP-C     09/17/2022, 12:31 PM [facility] [address] Office [phone] Fax [phone]    I spent 15 minutes examining this patient, reviewing medications, and using patient centered shared decision making involving her cardiac care.  Prior to her visit I  spent greater than 20 minutes reviewing her past medical history,  medications, and prior cardiac tests.

## 2022-09-17 ENCOUNTER — Telehealth: Payer: Self-pay | Admitting: Cardiology

## 2022-09-17 ENCOUNTER — Encounter: Payer: Self-pay | Admitting: General Practice

## 2022-09-17 ENCOUNTER — Ambulatory Visit: Payer: BC Managed Care – PPO | Attending: General Practice | Admitting: General Practice

## 2022-09-17 VITALS — BP 136/88 | HR 73 | Ht 76.0 in | Wt 275.6 lb

## 2022-09-17 DIAGNOSIS — I10 Essential (primary) hypertension: Secondary | ICD-10-CM | POA: Diagnosis not present

## 2022-09-17 DIAGNOSIS — I4891 Unspecified atrial fibrillation: Secondary | ICD-10-CM | POA: Diagnosis not present

## 2022-09-17 DIAGNOSIS — R002 Palpitations: Secondary | ICD-10-CM | POA: Diagnosis not present

## 2022-09-17 MED ORDER — APIXABAN 5 MG PO TABS
5.0000 mg | ORAL_TABLET | Freq: Two times a day (BID) | ORAL | 6 refills | Status: DC
Start: 2022-09-17 — End: 2022-09-17

## 2022-09-17 MED ORDER — APIXABAN 2.5 MG PO TABS
5.0000 mg | ORAL_TABLET | Freq: Two times a day (BID) | ORAL | 1 refills | Status: DC
Start: 2022-09-17 — End: 2022-09-17

## 2022-09-17 MED ORDER — DILTIAZEM HCL ER COATED BEADS 300 MG PO CP24: 300.0000 mg | ORAL_CAPSULE | Freq: Every day | ORAL | 6 refills | Status: DC

## 2022-09-17 MED ORDER — APIXABAN 5 MG PO TABS: 5.0000 mg | ORAL_TABLET | Freq: Two times a day (BID) | ORAL | Status: DC

## 2022-09-17 MED ORDER — APIXABAN 5 MG PO TABS: 5.0000 mg | ORAL_TABLET | Freq: Two times a day (BID) | ORAL | 1 refills | Status: DC

## 2022-09-17 NOTE — Patient Instructions (Signed)
Medication Instructions:  INCREASE ELIQUIS 5MG  TWICE DAILY  START DILTIAZEM 300MG  DAILY  MAKE SURE TO FINISH YOUR ANTIBIOTICS-IF YOU ARE NOT OVER YOUR RESPIRATORY INFECTION BEFORE YOUR CARDIOVERSION, PLEASE CALL us BECAUSE IT MAY NOT WORK  *If you need a refill on your cardiac medications before your next appointment, please call your pharmacy  Lab Work: CBC AND CMP ST LEAST 3 DAYS BEFORE CARDIOVERSION  If you have labs (blood work) drawn today and your tests are completely normal, you will receive your results only by:  MyChart Message (if you have MyChart) OR  A paper copy in the mail If you have any lab test that is abnormal or we need to change your treatment, we will call you to review the results.  Testing/Procedures: Your physician has recommended that you have a Cardioversion (DCCV). Electrical Cardioversion uses a jolt of electricity to your heart either through paddles or wired patches attached to your chest. This is a controlled, usually prescheduled, procedure. Defibrillation is done under light anesthesia in the hospital, and you usually go home the day of the procedure. This is done to get your heart back into a normal rhythm. You are not awake for the procedure. Please see the instruction sheet given to you today.   Follow-Up: At [facility], you and your health needs are our priority.  As part of our continuing mission to provide you with exceptional heart care, we have created designated Provider Care Teams.  These Care Teams include your primary Cardiologist (physician) and Advanced Practice Providers (APPs -  Physician Assistants and Nurse Practitioners) who all work together to provide you with the care you need, when you need it.  We recommend signing up for the patient portal called "MyChart".  Sign up information is provided on this After Visit Summary.  MyChart is used to connect with patients for Virtual Visits (Telemedicine).  Patients are able to view  lab/test results, encounter notes, upcoming appointments, etc.  Non-urgent messages can be sent to your provider as well.   To learn more about what you can do with MyChart, go to [email or web address].    Your next appointment:   AFTER CARDIOVERSION   Provider:   Olga Millers, MD  or Edd Fabian, FNP        Other Instructions Please try to avoid these triggers: Do not use any products that have nicotine or tobacco in them. These include cigarettes, e-cigarettes, and chewing tobacco. If you need help quitting, ask your doctor. Eat heart-healthy foods. Talk with your doctor about the right eating plan for you. Exercise regularly as told by your doctor. Stay hydrated INCREASE YOUR HYDRATION Do not drink alcohol, Caffeine or chocolate. Lose weight if you are overweight. Do not use drugs, including cannabis        Dear Douglas Whitney  You are scheduled for a Cardioversion on Wednesday, May 1 with Dr. Royann Shivers.  Please arrive at the [facility] (Main Entrance A) at [facility]: [address] [place], [place] [address] at 11:00 AM.    DIET:  Nothing to eat or drink after midnight except a sip of water with medications (see medication instructions below)  MEDICATION INSTRUCTIONS: !!IF ANY NEW MEDICATIONS ARE STARTED AFTER TODAY, PLEASE NOTIFY YOUR PROVIDER AS SOON AS POSSIBLE!!  FYI: Medications such as Semaglutide (Ozempic, [place]), Tirzepatide (Mounjaro, Zepbound), Dulaglutide (Trulicity), etc ("GLP1 agonists") must be held around the time of a procedure. Talk to your provider if you take one of these.  Continue taking your anticoagulant (blood thinner): Apixaban (Eliquis).  You will need to continue this after your procedure until you are told by your provider that it is safe to stop.    LABS:   Come to [facility] OFFICE OR ANY [facility]  3 DAYS BEFORE  FYI:  For your safety, and to allow [place] to monitor your vital signs accurately during the  surgery/procedure we request: If you have artificial nails, gel coating, SNS etc, please have those removed prior to your surgery/procedure. Not having the nail coverings /polish removed may result in cancellation or delay of your surgery/procedure.  You must have a responsible person to drive you home and stay in the waiting area during your procedure. Failure to do so could result in cancellation.  Bring your insurance cards.  *Special Note: Every effort is made to have your procedure done on time. Occasionally there are emergencies that occur at the hospital that may cause delays. Please be patient if a delay does occur.

## 2022-09-17 NOTE — Telephone Encounter (Signed)
Spoke to pharmacy-clarified prescriptions.   Rx resent for 90 day supply of eliquis.

## 2022-09-17 NOTE — Telephone Encounter (Signed)
Pt c/o medication issue:  1. Name of Medication: apixaban (ELIQUIS) 2.5 MG TABS tablet  diltiazem (CARDIZEM CD) 300 MG 24 hr capsule   2. How are you currently taking this medication (dosage and times per day)?    3. Are you having a reaction (difficulty breathing--STAT)?   4. What is your medication issue? Pharmacy would like a call back to go over this medication and how to take in regards to dosage. Please advise.

## 2022-09-17 NOTE — Addendum Note (Signed)
Addended by: Alyson Ingles on: 09/17/2022 03:42 PM   Modules accepted: Orders

## 2022-09-28 ENCOUNTER — Other Ambulatory Visit: Payer: Self-pay | Admitting: Nurse Practitioner

## 2022-09-28 DIAGNOSIS — R7989 Other specified abnormal findings of blood chemistry: Secondary | ICD-10-CM

## 2022-09-30 ENCOUNTER — Ambulatory Visit: Payer: BC Managed Care – PPO | Admitting: Nurse Practitioner

## 2022-10-01 DIAGNOSIS — J301 Allergic rhinitis due to pollen: Secondary | ICD-10-CM | POA: Insufficient documentation

## 2022-10-01 NOTE — Assessment & Plan Note (Signed)
Doing well on current dose of testosterone.

## 2022-10-01 NOTE — Assessment & Plan Note (Signed)
Now on diltiazem. Referred to cardiology for further evaluation

## 2022-10-01 NOTE — Assessment & Plan Note (Signed)
Stable blood pressure. Continue diltiazem as prescribed

## 2022-10-01 NOTE — Assessment & Plan Note (Signed)
Use advair twice daily  Use rescue inhaler as needed and as prescribed

## 2022-10-02 ENCOUNTER — Telehealth: Payer: Self-pay | Admitting: General Practice

## 2022-10-02 NOTE — Telephone Encounter (Signed)
Paper Work Dropped Off: Blood pressure results  Date:04.26.24  Location of paper: handed to [employer]

## 2022-10-02 NOTE — Telephone Encounter (Signed)
Called pt and relayed Edd Fabian, FNP-C's message and he was grateful for he call as he was concerned that they were too high. Reassured that they were not too high. He will continue to take BP/HR's daily. He has DCCV Wed. Will will continue to take BP/HR after. He will call with abnormal values, he will call back if he cannot log on to mychart, as I informed pt I can reset his password. Verbalized understanding.

## 2022-10-02 NOTE — Telephone Encounter (Signed)
Received a paper log from [place] from our front desk with BP/HR values as follows:    Thursday 10-01-22 AM 138/99/67, 131/85/84 AFTER WORK/IN YARD PM:  147/98/85; WATCHING TV 126/79;  FRIDAY 10-02-22 WOKE UP AM:  135/95/75, AFTER COOKING/SHOWER 142/93/65

## 2022-10-03 LAB — CBC
Hematocrit: 56.2 % — ABNORMAL HIGH (ref 37.5–51.0)
Hemoglobin: 18.9 g/dL — ABNORMAL HIGH (ref 13.0–17.7)
MCH: 31.4 pg (ref 26.6–33.0)
MCHC: 33.6 g/dL (ref 31.5–35.7)
MCV: 94 fL (ref 79–97)
Platelets: 312 10*3/uL (ref 150–450)
RBC: 6.01 x10E6/uL — ABNORMAL HIGH (ref 4.14–5.80)
RDW: 12.9 % (ref 11.6–15.4)
WBC: 7.4 10*3/uL (ref 3.4–10.8)

## 2022-10-03 LAB — COMPREHENSIVE METABOLIC PANEL
ALT: 26 IU/L (ref 0–44)
AST: 20 IU/L (ref 0–40)
Albumin/Globulin Ratio: 2.1 (ref 1.2–2.2)
Albumin: 4.8 g/dL (ref 3.8–4.9)
Alkaline Phosphatase: 57 IU/L (ref 44–121)
BUN/Creatinine Ratio: 13 (ref 9–20)
BUN: 13 mg/dL (ref 6–24)
Bilirubin Total: 1.2 mg/dL (ref 0.0–1.2)
CO2: 24 mmol/L (ref 20–29)
Calcium: 10.2 mg/dL (ref 8.7–10.2)
Chloride: 100 mmol/L (ref 96–106)
Creatinine, Ser: 1.02 mg/dL (ref 0.76–1.27)
Globulin, Total: 2.3 g/dL (ref 1.5–4.5)
Glucose: 98 mg/dL (ref 70–99)
Potassium: 4.3 mmol/L (ref 3.5–5.2)
Sodium: 139 mmol/L (ref 134–144)
Total Protein: 7.1 g/dL (ref 6.0–8.5)
eGFR: 88 mL/min/{1.73_m2} (ref >59)

## 2022-10-06 NOTE — Progress Notes (Signed)
Spoke with patient, Procedure scheduled for 1200, Please arrive at the hospital at 1100, NPO after midnight on  Tuesday, May take meds with sips of water in the AM, please have transportation for home post procedure, and someone to stay with pt for approximately 24 hours after

## 2022-10-07 ENCOUNTER — Ambulatory Visit (HOSPITAL_COMMUNITY)
Admission: RE | Admit: 2022-10-07 | Discharge: 2022-10-07 | Disposition: A | Discharge status: Home / Self Care | Payer: BC Managed Care – PPO | Attending: Cardiovascular Disease | Admitting: Cardiovascular Disease

## 2022-10-07 ENCOUNTER — Encounter (HOSPITAL_COMMUNITY): Payer: Self-pay | Admitting: Cardiovascular Disease

## 2022-10-07 ENCOUNTER — Ambulatory Visit (HOSPITAL_COMMUNITY): Payer: BC Managed Care – PPO | Admitting: Anesthesiology

## 2022-10-07 ENCOUNTER — Encounter (HOSPITAL_COMMUNITY)
Admission: RE | Disposition: A | Discharge status: Home / Self Care | Payer: Self-pay | Source: Home / Self Care | Attending: Cardiovascular Disease

## 2022-10-07 ENCOUNTER — Other Ambulatory Visit: Payer: Self-pay

## 2022-10-07 DIAGNOSIS — I4891 Unspecified atrial fibrillation: Secondary | ICD-10-CM | POA: Diagnosis present

## 2022-10-07 DIAGNOSIS — I10 Essential (primary) hypertension: Secondary | ICD-10-CM | POA: Insufficient documentation

## 2022-10-07 DIAGNOSIS — Z7901 Long term (current) use of anticoagulants: Secondary | ICD-10-CM | POA: Insufficient documentation

## 2022-10-07 DIAGNOSIS — Z87891 Personal history of nicotine dependence: Secondary | ICD-10-CM | POA: Diagnosis not present

## 2022-10-07 HISTORY — PX: CARDIOVERSION: SHX1299

## 2022-10-07 SURGERY — CARDIOVERSION
Anesthesia: General

## 2022-10-07 MED ORDER — LIDOCAINE 2% (20 MG/ML) 5 ML SYRINGE
INTRAMUSCULAR | Status: DC | PRN
  Administered 2022-10-07: 80 mg via INTRAVENOUS

## 2022-10-07 MED ORDER — PROPOFOL 10 MG/ML IV BOLUS
INTRAVENOUS | Status: DC | PRN
  Administered 2022-10-07: 100 mg via INTRAVENOUS

## 2022-10-07 MED ORDER — SODIUM CHLORIDE 0.9 % IV SOLN: INTRAVENOUS | Status: DC

## 2022-10-07 SURGICAL SUPPLY — 1 items: ELECT DEFIB PAD ADLT CADENCE (PAD) ×1 IMPLANT

## 2022-10-07 NOTE — Op Note (Signed)
Procedure: Electrical Cardioversion Indications:  Atrial Fibrillation  Procedure Details:  Consent: Risks of procedure as well as the alternatives and risks of each were explained to the (patient/caregiver).  Consent for procedure obtained.  Time Out: Verified patient identification, verified procedure, site/side was marked, verified correct patient position, special equipment/implants available, medications/allergies/relevent history reviewed, required imaging and test results available.  Performed  Patient placed on cardiac monitor, pulse oximetry, supplemental oxygen as necessary.  Sedation given:  propofol 100 mg IV, Dr. Desmond Lope Pacer pads placed anterior and posterior chest.  Cardioverted 1 time(s).  Cardioversion with synchronized biphasic 150J shock.  Evaluation: Findings: Post procedure EKG shows: NSR Complications: None Patient did tolerate procedure well.  Time Spent Directly with the Patient:  30  minutes   Douglas Whitney 10/07/2022, 12:08 PM

## 2022-10-07 NOTE — Anesthesia Postprocedure Evaluation (Signed)
Anesthesia Post Note  Patient: MOHID FURUYA  Procedure(s) Performed: CARDIOVERSION     Patient location during evaluation: Cath Lab Anesthesia Type: General Level of consciousness: awake and alert Pain management: pain level controlled Vital Signs Assessment: post-procedure vital signs reviewed and stable Respiratory status: spontaneous breathing, nonlabored ventilation, respiratory function stable and patient connected to nasal cannula oxygen Cardiovascular status: blood pressure returned to baseline and stable Postop Assessment: no apparent nausea or vomiting Anesthetic complications: no   No notable events documented.  Last Vitals:  Vitals:   10/07/22 1230 10/07/22 1237  BP: (!) 131/93 (!) 139/93  Pulse: 64 64  Resp: 12 17  Temp:    SpO2: 97% 97%    Last Pain:  Vitals:   10/07/22 1228  TempSrc:   PainSc: 0-No pain                 Collene Schlichter

## 2022-10-07 NOTE — Transfer of Care (Signed)
Immediate Anesthesia Transfer of Care Note  Patient: Douglas Whitney  Procedure(s) Performed: CARDIOVERSION  Patient Location: Cath Lab  Anesthesia Type:General  Level of Consciousness: awake, alert , and oriented  Airway & Oxygen Therapy: Patient Spontanous Breathing  Post-op Assessment: Report given to RN and Post -op Vital signs reviewed and stable  Post vital signs: Reviewed and stable  Last Vitals:  Vitals Value Taken Time  BP    Temp    Pulse    Resp    SpO2      Last Pain:  Vitals:   10/07/22 1135  TempSrc: Temporal  PainSc:          Complications: No notable events documented.

## 2022-10-07 NOTE — Progress Notes (Signed)
HPI: Follow-up atrial fibrillation.  Previously is followed by Dr. Azucena Cecil.  Chest CT September 2021 showed RCA calcification and pericardial calcification.  Monitor August 2022 showed sinus rhythm with occasional PAC, rare atrial triplet and occasional PVC.  Patient seen by primary care April 2024 and noted to be in atrial fibrillation associated with upper respiratory infection.  He was treated with Cardizem and anticoagulation.  Underwent cardioversion Oct 07, 2022.  Since last seen he denies dyspnea on exertion, chest pain, palpitations, syncope or bleeding.  He feels much better following cardioversion.  Current Outpatient Medications  Medication Sig Dispense Refill   albuterol (VENTOLIN HFA) 108 (90 Base) MCG/ACT inhaler INHALE 1 TO 2 PUFFS INTO THE LUNGS EVERY 6 HOURS AS NEEDED FOR WHEEZING OR SHORTNESS OF BREATH. 18 g 5   apixaban (ELIQUIS) 5 MG TABS tablet Take 1 tablet (5 mg total) by mouth 2 (two) times daily. 180 tablet 1   azelastine (ASTELIN) 0.1 % nasal spray Place 2 sprays into both nostrils 2 (two) times daily. Use in each nostril as directed 30 mL 5   diltiazem (CARDIZEM CD) 300 MG 24 hr capsule Take 1 capsule (300 mg total) by mouth daily. 30 capsule 6   fluticasone furoate-vilanterol (BREO ELLIPTA) 100-25 MCG/ACT AEPB Inhale 1 puff into the lungs daily. (Patient taking differently: Inhale 1 puff into the lungs 2 (two) times daily.) 28 each 5   hydrochlorothiazide (HYDRODIURIL) 12.5 MG tablet Take 1 tablet (12.5 mg total) by mouth daily. 90 tablet 3   Multiple Vitamin (MULTIVITAMIN WITH MINERALS) TABS tablet Take 1 tablet by mouth daily.     Nutritional Supplements (JUICE PLUS FIBRE PO) Take 3 tablets by mouth daily. Vegetable Fruit Berry blend     testosterone cypionate (DEPOTESTOSTERONE CYPIONATE) 200 MG/ML injection INJECT 1 ML SUBCUTANEOUSLY ONCE WEEKLY 10 mL 1   No current facility-administered medications for this visit.     Past Medical History:  Diagnosis  Date   Acid reflux    Hypertension    Seasonal allergies    Sinusitis    Snores    Wears glasses     Past Surgical History:  Procedure Laterality Date   CARDIOVERSION N/A 10/07/2022   Procedure: CARDIOVERSION;  Surgeon: Thurmon Fair, MD;  [facility];  Service: Cardiovascular;  Laterality: N/A;   CHOLECYSTECTOMY N/A 08/01/2015   Procedure: LAPAROSCOPIC CHOLECYSTECTOMY WITH INTRAOPERATIVE CHOLANGIOGRAM;  Surgeon: Darnell Level, MD;  Location: [facility] OR;  Service: General;  Laterality: N/A;   ESOPHAGOGASTRODUODENOSCOPY  2017   FINGER AMPUTATION Left 1983   cut off tip index finger   FRACTURE SURGERY     I & D EXTREMITY Left 05/06/2018   Procedure: IRRIGATION AND DEBRIDEMENT SMALL FINGER;  Surgeon: Teryl Lucy, MD;  Location: [facility] OR;  Service: Orthopedics;  Laterality: Left;   JOINT REPLACEMENT     NASAL SEPTOPLASTY W/ TURBINOPLASTY Bilateral 04/14/2013   Procedure: NASAL SEPTOPLASTY BILATERAL INFERIOR TURBINATE REDUCTION ;  Surgeon: Osborn Coho, MD;  Location: [facility];  Service: ENT;  Laterality: Bilateral;   ORIF FINGER FRACTURE Left 1984   middle   TOTAL HIP ARTHROPLASTY Right 03/2016   WISDOM TOOTH EXTRACTION      Social History   Socioeconomic History   Marital status: Married    Spouse name: Not on file   Number of children: 2   Years of education: Not on file   Highest education level: Not on file  Occupational History   Not on file  Tobacco Use   Smoking status: Former    Packs/day: 2.00    Years: 6.00    Additional pack years: 0.00    Total pack years: 12.00    Types: Cigarettes    Start date: 60    Quit date: 1998    Years since quitting: 26.3   Smokeless tobacco: Former    Types: Chew, Snuff    Quit date: 1999  Vaping Use   Vaping Use: Never used  Substance and Sexual Activity   Alcohol use: Never    Comment: 05/05/2018 "maybe 2 drinks/year"   Drug use: No   Sexual activity: Yes    Partners: Female  Other Topics  Concern   Not on file  Social History Narrative   Not on file   Social Determinants of Health   Financial Resource Strain: Not on file  Food Insecurity: Not on file  Transportation Needs: Not on file  Physical Activity: Not on file  Stress: Not on file  Social Connections: Not on file  Intimate Partner Violence: Not on file    Family History  Problem Relation Age of Onset   High blood pressure Father    Stroke Other     ROS: no fevers or chills, productive cough, hemoptysis, dysphasia, odynophagia, melena, hematochezia, dysuria, hematuria, rash, seizure activity, orthopnea, PND, pedal edema, claudication. Remaining systems are negative.  Physical Exam: Well-developed well-nourished in no acute distress.  Skin is warm and dry.  HEENT is normal.  Neck is supple.  Chest is clear to auscultation with normal expansion.  Cardiovascular exam is regular rate and rhythm.  Abdominal exam nontender or distended. No masses palpated. Extremities show no edema. neuro grossly intact  ECG-normal sinus rhythm at a rate of 69, normal axis, first-degree AV block, cannot rule out prior inferior infarct.  Personally reviewed  A/P  1 paroxysmal atrial fibrillation-patient remains in sinus rhythm status post cardioversion.  Recent episode may be related to previous upper respiratory infection.  Will continue Cardizem for rate control if atrial fibrillation recurs.  Continue apixaban.  CHA2DS2-VASc is 2 for coronary artery disease and hypertension.  Will arrange echocardiogram to assess LV function.  Check TSH.  2 hypertension-patient's blood pressure is elevated; add losartan 50 mg daily.  Check potassium and renal function in 1 week.  Follow blood pressure and adjust medicines as needed.  3 coronary calcification-will add Crestor 40 mg daily.  Check lipids and liver in 8 weeks.  Olga Millers, MD

## 2022-10-07 NOTE — Anesthesia Procedure Notes (Signed)
Procedure Name: General with mask airway Date/Time: 10/07/2022 12:04 PM  Performed by: Marena Chancy, CRNAPre-anesthesia Checklist: Timeout performed, Patient being monitored, Suction available, Emergency Drugs available and Patient identified Patient Re-evaluated:Patient Re-evaluated prior to induction Oxygen Delivery Method: Ambu bag Preoxygenation: Pre-oxygenation with 100% oxygen Induction Type: IV induction

## 2022-10-07 NOTE — Anesthesia Preprocedure Evaluation (Signed)
Anesthesia Evaluation  Patient identified by MRN, date of birth, ID band Patient awake    Reviewed: Allergy & Precautions, NPO status , Patient's Chart, lab work & pertinent test results  Airway Mallampati: II  TM Distance: >3 FB Neck ROM: Full    Dental  (+) Teeth Intact, Dental Advisory Given   Pulmonary asthma , former smoker   Pulmonary exam normal breath sounds clear to auscultation       Cardiovascular hypertension, Pt. on medications + dysrhythmias Atrial Fibrillation  Rhythm:Irregular Rate:Abnormal     Neuro/Psych negative neurological ROS     GI/Hepatic Neg liver ROS,GERD  ,,  Endo/Other  negative endocrine ROS    Renal/GU negative Renal ROS     Musculoskeletal negative musculoskeletal ROS (+)    Abdominal   Peds  Hematology  (+) Blood dyscrasia (Eliquis)   Anesthesia Other Findings Day of surgery medications reviewed with the patient.  Reproductive/Obstetrics                             Anesthesia Physical Anesthesia Plan  ASA: 3  Anesthesia Plan: General   Post-op Pain Management: Minimal or no pain anticipated   Induction: Intravenous  PONV Risk Score and Plan: 2 and TIVA  Airway Management Planned: Mask  Additional Equipment:   Intra-op Plan:   Post-operative Plan:   Informed Consent: I have reviewed the patients History and Physical, chart, labs and discussed the procedure including the risks, benefits and alternatives for the proposed anesthesia with the patient or authorized representative who has indicated his/her understanding and acceptance.     Dental advisory given  Plan Discussed with: CRNA  Anesthesia Plan Comments:        Anesthesia Quick Evaluation

## 2022-10-07 NOTE — Interval H&P Note (Signed)
History and Physical Interval Note:  10/07/2022 10:57 AM  Douglas Whitney  has presented today for surgery, with the diagnosis of AFIB.  The various methods of treatment have been discussed with the patient and family. After consideration of risks, benefits and other options for treatment, the patient has consented to  Procedure(s): CARDIOVERSION (N/A) as a surgical intervention.  The patient's history has been reviewed, patient examined, no change in status, stable for surgery.  I have reviewed the patient's chart and labs.  Questions were answered to the patient's satisfaction.     Alberto Schoch

## 2022-10-08 ENCOUNTER — Encounter (HOSPITAL_COMMUNITY): Payer: Self-pay | Admitting: Cardiovascular Disease

## 2022-10-14 ENCOUNTER — Encounter: Payer: Self-pay | Admitting: Cardiology

## 2022-10-14 ENCOUNTER — Ambulatory Visit: Payer: BC Managed Care – PPO | Attending: Cardiology | Admitting: Cardiology

## 2022-10-14 VITALS — BP 142/88 | HR 69 | Ht 76.0 in | Wt 270.0 lb

## 2022-10-14 DIAGNOSIS — I10 Essential (primary) hypertension: Secondary | ICD-10-CM

## 2022-10-14 DIAGNOSIS — E7800 Pure hypercholesterolemia, unspecified: Secondary | ICD-10-CM

## 2022-10-14 DIAGNOSIS — I4891 Unspecified atrial fibrillation: Secondary | ICD-10-CM

## 2022-10-14 MED ORDER — LOSARTAN POTASSIUM 50 MG PO TABS
50.0000 mg | ORAL_TABLET | Freq: Every day | ORAL | 3 refills | Status: DC
Start: 2022-10-14 — End: 2022-12-18

## 2022-10-14 MED ORDER — ROSUVASTATIN CALCIUM 40 MG PO TABS
40.0000 mg | ORAL_TABLET | Freq: Every day | ORAL | 3 refills | Status: DC
Start: 2022-10-14 — End: 2022-12-18

## 2022-10-14 MED ORDER — DILTIAZEM HCL ER COATED BEADS 300 MG PO CP24
300.0000 mg | ORAL_CAPSULE | Freq: Every day | ORAL | 3 refills | Status: DC
Start: 2022-10-14 — End: 2023-12-17

## 2022-10-14 NOTE — Patient Instructions (Signed)
Medication Instructions:   START ROSUVASTATIN 40 MG ONCE DAILY  START LOSARTAN 50 MG ONCE DAILY  *If you need a refill on your cardiac medications before your next appointment, please call your pharmacy*   Lab Work:  Your physician recommends that you return for lab work in: ONE WEEK-DO NOT NEED TO FAST  Your physician recommends that you return for lab work in: [facility]  If you have labs (blood work) drawn today and your tests are completely normal, you will receive your results only by: MyChart Message (if you have MyChart) OR A paper copy in the mail If you have any lab test that is abnormal or we need to change your treatment, we will call you to review the results.   Testing/Procedures:  Your physician has requested that you have an echocardiogram. Echocardiography is a painless test that uses sound waves to create images of your heart. It provides your doctor with information about the size and shape of your heart and how well your heart's chambers and valves are working. This procedure takes approximately one hour. There are no restrictions for this procedure. Please do NOT wear cologne, perfume, aftershave, or lotions (deodorant is allowed). Please arrive 15 minutes prior to your appointment time. [address]   Follow-Up: At [facility], you and your health needs are our priority.  As part of our continuing mission to provide you with exceptional heart care, we have created designated Provider Care Teams.  These Care Teams include your primary Cardiologist (physician) and Advanced Practice Providers (APPs -  Physician Assistants and Nurse Practitioners) who all work together to provide you with the care you need, when you need it.  We recommend signing up for the patient portal called "MyChart".  Sign up information is provided on this After Visit Summary.  MyChart is used to connect with patients for Virtual Visits (Telemedicine).  Patients are  able to view lab/test results, encounter notes, upcoming appointments, etc.  Non-urgent messages can be sent to your provider as well.   To learn more about what you can do with MyChart, go to [email or web address].    Your next appointment:   6 month(s)  Provider:   Olga Millers, MD

## 2022-10-20 DIAGNOSIS — J22 Unspecified acute lower respiratory infection: Secondary | ICD-10-CM | POA: Insufficient documentation

## 2022-10-20 DIAGNOSIS — R600 Localized edema: Secondary | ICD-10-CM | POA: Insufficient documentation

## 2022-10-20 NOTE — Assessment & Plan Note (Signed)
Start doxycycline 100 mg twice daily for 10 days.  -Rest and increase fluids. Continue using OTC medication to control symptoms.

## 2022-10-20 NOTE — Assessment & Plan Note (Signed)
Add HCTZ 12.5 mg tablets daily  -increase water and decrease sodium intake.

## 2022-10-20 NOTE — Assessment & Plan Note (Signed)
Increase diltiazem to 90 mg TID.  Add HCTZ 25 mg daily  Continue with lifestyle changes.  Monitor bp at homme. - he understands the recommendation if for his blood pressure to be 130/80 or better

## 2022-10-20 NOTE — Assessment & Plan Note (Signed)
Increase diltiazem to 90 mg three times daily..  Continue eliquis twice daily  Contacted cardiology and obtained appointment with cardiology for furhter evaluation and treatment.

## 2022-10-21 ENCOUNTER — Other Ambulatory Visit: Payer: Self-pay | Admitting: Nurse Practitioner

## 2022-10-21 ENCOUNTER — Ambulatory Visit (HOSPITAL_COMMUNITY): Payer: BC Managed Care – PPO | Attending: Cardiology

## 2022-10-21 DIAGNOSIS — Z0000 Encounter for general adult medical examination without abnormal findings: Secondary | ICD-10-CM

## 2022-10-21 DIAGNOSIS — I4891 Unspecified atrial fibrillation: Secondary | ICD-10-CM | POA: Diagnosis present

## 2022-10-21 DIAGNOSIS — R7989 Other specified abnormal findings of blood chemistry: Secondary | ICD-10-CM

## 2022-10-21 LAB — ECHOCARDIOGRAM COMPLETE
Area-P 1/2: 3.6 cm2
S' Lateral: 2.7 cm

## 2022-10-21 LAB — BASIC METABOLIC PANEL
BUN/Creatinine Ratio: 15 (ref 9–20)
BUN: 14 mg/dL (ref 6–24)
CO2: 25 mmol/L (ref 20–29)
Calcium: 9.9 mg/dL (ref 8.7–10.2)
Chloride: 97 mmol/L (ref 96–106)
Creatinine, Ser: 0.96 mg/dL (ref 0.76–1.27)
Glucose: 93 mg/dL (ref 70–99)
Potassium: 4 mmol/L (ref 3.5–5.2)
Sodium: 138 mmol/L (ref 134–144)
eGFR: 95 mL/min/{1.73_m2} (ref >59)

## 2022-10-27 ENCOUNTER — Encounter: Payer: Self-pay | Admitting: *Deleted

## 2022-10-29 ENCOUNTER — Telehealth: Payer: Self-pay | Admitting: *Deleted

## 2022-10-29 NOTE — Telephone Encounter (Signed)
Pt in office with wife today and he has a lab appointment scheduled before his physical on 11/18/22.  His physical is 11/26/22.  Routing to provider to determine if these lab are needed or if other labs are needed, or if he doesn't need any labs due to having so many recently. Please advise

## 2022-11-06 ENCOUNTER — Other Ambulatory Visit: Payer: Self-pay | Admitting: Nurse Practitioner

## 2022-11-06 ENCOUNTER — Telehealth: Payer: Self-pay | Admitting: *Deleted

## 2022-11-06 DIAGNOSIS — I10 Essential (primary) hypertension: Secondary | ICD-10-CM

## 2022-11-06 DIAGNOSIS — R600 Localized edema: Secondary | ICD-10-CM

## 2022-11-06 MED ORDER — HYDROCHLOROTHIAZIDE 25 MG PO TABS
25.0000 mg | ORAL_TABLET | Freq: Every day | ORAL | 3 refills | Status: DC
Start: 2022-11-06 — End: 2022-11-25

## 2022-11-06 NOTE — Telephone Encounter (Signed)
Please let the patient know that I changed the prescription to HCTZ 25 mg, up from 12.5 mg. I sent new prescription to [facility]  Thanks so much.   -HB

## 2022-11-06 NOTE — Telephone Encounter (Signed)
Hey. Those labs are necessary because of the testosterone shots. Sorry I took so long to respond

## 2022-11-06 NOTE — Telephone Encounter (Signed)
Pt wife calling stating that pt wasa told to double up on fluid pill and is now out of medication and pharmacy said it is 17 days too early to refill.  Please send in new Rx for this to [facility].

## 2022-11-11 NOTE — Telephone Encounter (Signed)
Pt informed of below.  

## 2022-11-18 ENCOUNTER — Other Ambulatory Visit: Payer: BC Managed Care – PPO

## 2022-11-18 DIAGNOSIS — Z0000 Encounter for general adult medical examination without abnormal findings: Secondary | ICD-10-CM

## 2022-11-18 DIAGNOSIS — R7989 Other specified abnormal findings of blood chemistry: Secondary | ICD-10-CM

## 2022-11-19 LAB — CBC WITH DIFFERENTIAL/PLATELET
Basophils Absolute: 0.1 10*3/uL (ref 0.0–0.2)
Basos: 1 %
EOS (ABSOLUTE): 0.1 10*3/uL (ref 0.0–0.4)
Eos: 1 %
Hematocrit: 53.1 % — ABNORMAL HIGH (ref 37.5–51.0)
Hemoglobin: 18.1 g/dL — ABNORMAL HIGH (ref 13.0–17.7)
Immature Grans (Abs): 0 10*3/uL (ref 0.0–0.1)
Immature Granulocytes: 0 %
Lymphocytes Absolute: 2.3 10*3/uL (ref 0.7–3.1)
Lymphs: 30 %
MCH: 31.7 pg (ref 26.6–33.0)
MCHC: 34.1 g/dL (ref 31.5–35.7)
MCV: 93 fL (ref 79–97)
Monocytes Absolute: 0.7 10*3/uL (ref 0.1–0.9)
Monocytes: 9 %
Neutrophils Absolute: 4.4 10*3/uL (ref 1.4–7.0)
Neutrophils: 59 %
Platelets: 296 10*3/uL (ref 150–450)
RBC: 5.71 x10E6/uL (ref 4.14–5.80)
RDW: 12.6 % (ref 11.6–15.4)
WBC: 7.5 10*3/uL (ref 3.4–10.8)

## 2022-11-19 LAB — HEMOGLOBIN A1C
Est. average glucose Bld gHb Est-mCnc: 111 mg/dL
Hgb A1c MFr Bld: 5.5 % (ref 4.8–5.6)

## 2022-11-19 LAB — LIPID PANEL
Chol/HDL Ratio: 2.7 ratio (ref 0.0–5.0)
Cholesterol, Total: 93 mg/dL — ABNORMAL LOW (ref 100–199)
HDL: 35 mg/dL — ABNORMAL LOW (ref >39)
LDL Chol Calc (NIH): 40 mg/dL (ref 0–99)
Triglycerides: 90 mg/dL (ref 0–149)
VLDL Cholesterol Cal: 18 mg/dL (ref 5–40)

## 2022-11-19 LAB — TSH: TSH: 1.69 u[IU]/mL (ref 0.450–4.500)

## 2022-11-19 LAB — TESTOSTERONE: Testosterone: 926 ng/dL — ABNORMAL HIGH (ref 264–916)

## 2022-11-20 ENCOUNTER — Encounter: Payer: Self-pay | Admitting: Cardiology

## 2022-11-24 NOTE — Telephone Encounter (Signed)
Please approve or deny this medication and close encounter.

## 2022-11-26 ENCOUNTER — Ambulatory Visit (INDEPENDENT_AMBULATORY_CARE_PROVIDER_SITE_OTHER): Payer: BC Managed Care – PPO | Admitting: Nurse Practitioner

## 2022-11-26 ENCOUNTER — Encounter: Payer: Self-pay | Admitting: Nurse Practitioner

## 2022-11-26 VITALS — BP 137/81 | HR 80 | Ht 76.0 in | Wt 281.0 lb

## 2022-11-26 DIAGNOSIS — R7989 Other specified abnormal findings of blood chemistry: Secondary | ICD-10-CM

## 2022-11-26 DIAGNOSIS — J4520 Mild intermittent asthma, uncomplicated: Secondary | ICD-10-CM | POA: Diagnosis not present

## 2022-11-26 DIAGNOSIS — I4819 Other persistent atrial fibrillation: Secondary | ICD-10-CM | POA: Diagnosis not present

## 2022-11-26 DIAGNOSIS — I10 Essential (primary) hypertension: Secondary | ICD-10-CM | POA: Diagnosis not present

## 2022-11-26 DIAGNOSIS — Z0001 Encounter for general adult medical examination with abnormal findings: Secondary | ICD-10-CM

## 2022-11-26 DIAGNOSIS — Z0000 Encounter for general adult medical examination without abnormal findings: Secondary | ICD-10-CM

## 2022-11-26 DIAGNOSIS — Z23 Encounter for immunization: Secondary | ICD-10-CM | POA: Diagnosis not present

## 2022-11-26 DIAGNOSIS — R600 Localized edema: Secondary | ICD-10-CM

## 2022-11-26 MED ORDER — FLUTICASONE FUROATE-VILANTEROL 100-25 MCG/ACT IN AEPB
1.0000 | INHALATION_SPRAY | Freq: Every day | RESPIRATORY_TRACT | 5 refills | Status: DC
Start: 2022-11-26 — End: 2023-01-26

## 2022-11-26 MED ORDER — HYDROCHLOROTHIAZIDE 25 MG PO TABS
25.0000 mg | ORAL_TABLET | Freq: Two times a day (BID) | ORAL | 3 refills | Status: DC
Start: 2022-11-26 — End: 2023-04-05

## 2022-11-26 NOTE — Progress Notes (Signed)
Complete physical exam   Patient: Douglas Whitney   DOB: 23-May-1971   52 y.o. Male  MRN: [id number] Visit Date: 11/26/2022    Chief Complaint  Patient presents with   Annual Exam   Subjective    Douglas Whitney is a 52 y.o. male who presents today for a complete physical exam.  He reports consuming a  heart healthy  diet. The patient has a physically strenuous job, but has no regular exercise apart from work.  He generally feels well. He does not have additional problems to discuss today.   HPI  Annual physical a-fib -now followed by cardiologist after cardioversion HTN -generally well controlled  Labs done prior to  today  -very good lipid panel. -testosterone  slightly elevated but good level  -No new concerns or complaints today. -He denies chest pain, chest pressure, or shortness of breath. He denies headaches or visual disturbances. He denies abdominal pain, nausea, vomiting, or changes in bowel or bladder habits.    Past Medical History:  Diagnosis Date   Acid reflux    Hypertension    Seasonal allergies    Sinusitis    Snores    Wears glasses    Past Surgical History:  Procedure Laterality Date   CARDIOVERSION N/A 10/07/2022   Procedure: CARDIOVERSION;  Surgeon: Thurmon Fair, MD;  Location: [facility];  Service: Cardiovascular;  Laterality: N/A;   CHOLECYSTECTOMY N/A 08/01/2015   Procedure: LAPAROSCOPIC CHOLECYSTECTOMY WITH INTRAOPERATIVE CHOLANGIOGRAM;  Surgeon: Darnell Level, MD;  Location: [facility] OR;  Service: General;  Laterality: N/A;   ESOPHAGOGASTRODUODENOSCOPY  2017   FINGER AMPUTATION Left 1983   cut off tip index finger   FRACTURE SURGERY     I & D EXTREMITY Left 05/06/2018   Procedure: IRRIGATION AND DEBRIDEMENT SMALL FINGER;  Surgeon: Teryl Lucy, MD;  Location: [facility] OR;  Service: Orthopedics;  Laterality: Left;   JOINT REPLACEMENT     NASAL SEPTOPLASTY W/ TURBINOPLASTY Bilateral 04/14/2013   Procedure: NASAL SEPTOPLASTY BILATERAL INFERIOR  TURBINATE REDUCTION ;  Surgeon: Osborn Coho, MD;  Location: [facility];  Service: ENT;  Laterality: Bilateral;   ORIF FINGER FRACTURE Left 1984   middle   TOTAL HIP ARTHROPLASTY Right 03/2016   WISDOM TOOTH EXTRACTION     Social History   Socioeconomic History   Marital status: Married    Spouse name: Not on file   Number of children: 2   Years of education: Not on file   Highest education level: Not on file  Occupational History   Not on file  Tobacco Use   Smoking status: Former    Packs/day: 2.00    Years: 6.00    Additional pack years: 0.00    Total pack years: 12.00    Types: Cigarettes    Start date: 58    Quit date: 1998    Years since quitting: 26.5   Smokeless tobacco: Former    Types: Chew, Snuff    Quit date: 1999  Vaping Use   Vaping Use: Never used  Substance and Sexual Activity   Alcohol use: Never    Comment: 05/05/2018 "maybe 2 drinks/year"   Drug use: No   Sexual activity: Yes    Partners: Female  Other Topics Concern   Not on file  Social History Narrative   Not on file   Social Determinants of Health   Financial Resource Strain: Not on file  Food Insecurity: Not on file  Transportation Needs: Not on file  Physical Activity: Not on file  Stress: Not on file  Social Connections: Not on file  Intimate Partner Violence: Not on file   Family Status  Relation Name Status   Father  (Not Specified)   Other  (Not Specified)   Family History  Problem Relation Age of Onset   High blood pressure Father    Stroke Other    No Known Allergies  Patient Care Team: Carlean Jews, NP as PCP - General (Family Medicine) Lewayne Bunting, MD as PCP - Cardiology (Cardiology)   Medications: Outpatient Medications Prior to Visit  Medication Sig   albuterol (VENTOLIN HFA) 108 (90 Base) MCG/ACT inhaler INHALE 1 TO 2 PUFFS INTO THE LUNGS EVERY 6 HOURS AS NEEDED FOR WHEEZING OR SHORTNESS OF BREATH.   apixaban (ELIQUIS) 5 MG TABS  tablet Take 1 tablet (5 mg total) by mouth 2 (two) times daily.   azelastine (ASTELIN) 0.1 % nasal spray Place 2 sprays into both nostrils 2 (two) times daily. Use in each nostril as directed   diltiazem (CARDIZEM CD) 300 MG 24 hr capsule Take 1 capsule (300 mg total) by mouth daily.   losartan (COZAAR) 50 MG tablet Take 1 tablet (50 mg total) by mouth daily.   Multiple Vitamin (MULTIVITAMIN WITH MINERALS) TABS tablet Take 1 tablet by mouth daily.   Nutritional Supplements (JUICE PLUS FIBRE PO) Take 3 tablets by mouth daily. Vegetable Fruit Berry blend   rosuvastatin (CRESTOR) 40 MG tablet Take 1 tablet (40 mg total) by mouth daily.   testosterone cypionate (DEPOTESTOSTERONE CYPIONATE) 200 MG/ML injection INJECT 1 ML SUBCUTANEOUSLY ONCE WEEKLY   [DISCONTINUED] fluticasone furoate-vilanterol (BREO ELLIPTA) 100-25 MCG/ACT AEPB Inhale 1 puff into the lungs daily. (Patient taking differently: Inhale 1 puff into the lungs 2 (two) times daily.)   [DISCONTINUED] hydrochlorothiazide (HYDRODIURIL) 25 MG tablet Take 1 tablet (25 mg total) by mouth daily.   No facility-administered medications prior to visit.    Review of Systems See HPI    Last CBC Lab Results  Component Value Date   WBC 7.5 11/18/2022   HGB 18.1 (H) 11/18/2022   HCT 53.1 (H) 11/18/2022   MCV 93 11/18/2022   MCH 31.7 11/18/2022   RDW 12.6 11/18/2022   PLT 296 11/18/2022   Last metabolic panel Lab Results  Component Value Date   GLUCOSE 93 10/21/2022   NA 138 10/21/2022   K 4.0 10/21/2022   CL 97 10/21/2022   CO2 25 10/21/2022   BUN 14 10/21/2022   CREATININE 0.96 10/21/2022   EGFR 95 10/21/2022   CALCIUM 9.9 10/21/2022   PROT 6.6 12/01/2022   ALBUMIN 4.6 12/01/2022   LABGLOB 2.3 10/02/2022   AGRATIO 2.1 10/02/2022   BILITOT 0.9 12/01/2022   ALKPHOS 49 12/01/2022   AST 25 12/01/2022   ALT 38 12/01/2022   ANIONGAP 9 07/06/2019   Last lipids Lab Results  Component Value Date   CHOL 96 (L) 12/01/2022   HDL  36 (L) 12/01/2022   LDLCALC 42 12/01/2022   TRIG 92 12/01/2022   CHOLHDL 2.7 12/01/2022   Last hemoglobin A1c Lab Results  Component Value Date   HGBA1C 5.5 11/18/2022   Last thyroid functions Lab Results  Component Value Date   TSH 1.690 11/18/2022       Objective     Today's Vitals   11/26/22 1012  BP: 137/81  Pulse: 80  SpO2: 96%  Weight: 281 lb (127.5 kg)  Height: 6\' 4"  (1.93 m)   Body  mass index is 34.2 kg/m.  BP Readings from Last 3 Encounters:  11/26/22 137/81  10/14/22 (Abnormal) 142/88  10/07/22 (Abnormal) 139/93    Wt Readings from Last 3 Encounters:  11/26/22 281 lb (127.5 kg)  10/14/22 270 lb (122.5 kg)  10/07/22 270 lb (122.5 kg)     Physical Exam Vitals and nursing note reviewed.  Constitutional:      Appearance: Normal appearance. He is well-developed.  HENT:     Head: Normocephalic and atraumatic.     Right Ear: Tympanic membrane, ear canal and external ear normal.     Left Ear: Tympanic membrane, ear canal and external ear normal.     Nose: Nose normal.     Mouth/Throat:     Mouth: Mucous membranes are moist.     Pharynx: Oropharynx is clear.  Eyes:     Extraocular Movements: Extraocular movements intact.     Conjunctiva/sclera: Conjunctivae normal.     Pupils: Pupils are equal, round, and reactive to light.  Neck:     Vascular: No carotid bruit.  Cardiovascular:     Rate and Rhythm: Normal rate and regular rhythm.     Pulses: Normal pulses.     Heart sounds: Normal heart sounds.  Pulmonary:     Effort: Pulmonary effort is normal.     Breath sounds: Normal breath sounds.  Abdominal:     General: Bowel sounds are normal. There is no distension.     Palpations: Abdomen is soft. There is no mass.     Tenderness: There is no abdominal tenderness. There is no right CVA tenderness, left CVA tenderness, guarding or rebound.     Hernia: No hernia is present.  Musculoskeletal:        General: Normal range of motion.     Cervical back:  Normal range of motion and neck supple.  Lymphadenopathy:     Cervical: No cervical adenopathy.  Skin:    General: Skin is warm and dry.     Capillary Refill: Capillary refill takes less than 2 seconds.  Neurological:     General: No focal deficit present.     Mental Status: He is alert and oriented to person, place, and time.  Psychiatric:        Mood and Affect: Mood normal.        Behavior: Behavior normal.        Thought Content: Thought content normal.        Judgment: Judgment normal.     Last depression screening scores   Row Labels 11/26/2022   10:15 AM 08/25/2022    8:39 AM 08/12/2021    8:16 AM  PHQ 2/9 Scores   Section Header. No data exists in this row.     PHQ - 2 Score   0 0 0  PHQ- 9 Score   0  0   Last fall risk screening   Row Labels 09/09/2022   11:30 AM  Fall Risk    Section Header. No data exists in this row.   Falls in the past year?   0  Number falls in past yr:   0  Injury with Fall?   0  Follow up   Falls evaluation completed     Assessment & Plan    Encounter for general adult medical examination with abnormal findings  Essential hypertension Assessment & Plan: Increase diltiazem to 90 mg TID.  -Patient back on losartan 50 mg -HCTZ increased to 50 mg daily. Continue with lifestyle  changes.  Monitor bp at home. - he understands the recommendation if for his blood pressure to be 130/80 or better  -Regular follow-up with cardiology as scheduled.  Orders: -     hydroCHLOROthiazide; Take 1 tablet (25 mg total) by mouth 2 (two) times daily.  Dispense: 60 tablet; Refill: 3  Persistent atrial fibrillation (HCC) Assessment & Plan: Increase diltiazem to 90 mg three times daily..  Continue eliquis twice daily  Continue regular visits with cardiology.    Mild intermittent asthma without complication Assessment & Plan: Use advair twice daily  Use rescue inhaler as needed and as prescribed   Orders: -     Fluticasone Furoate-Vilanterol; Inhale  1 puff into the lungs daily.  Dispense: 28 each; Refill: 5  Localized edema Assessment & Plan: Increased HCTZ to 50 mg daily. Limit sodium intake and increase water.  Orders: -     hydroCHLOROthiazide; Take 1 tablet (25 mg total) by mouth 2 (two) times daily.  Dispense: 60 tablet; Refill: 3  Low testosterone Assessment & Plan: Doing well on current dose of testosterone.    Need for Tdap vaccination -     Tdap vaccine greater than or equal to 7yo IM      Immunization History  Administered Date(s) Administered   Influenza Whole 02/20/2019   Influenza, Quadrivalent, Recombinant, Inj, Pf 05/04/2021   Influenza-Unspecified 04/08/2021   PNEUMOCOCCAL CONJUGATE-20 05/04/2021   Tdap 11/26/2022    Health Maintenance  Topic Date Due   COVID-19 Vaccine (1) Never done   Hepatitis C Screening  Never done   Zoster Vaccines- Shingrix (1 of 2) 02/26/2023 (Originally 02/10/2019)   INFLUENZA VACCINE  01/07/2023   Fecal DNA (Cologuard)  06/23/2024   DTaP/Tdap/Td (2 - Td or Tdap) 11/25/2032   HIV Screening  Completed   HPV VACCINES  Aged Out    Discussed health benefits of physical activity, and encouraged him to engage in regular exercise appropriate for his age and condition.   Return in about 4 months (around 03/28/2023) for blood pressure.        Carlean Jews, NP  [facility] [phone] (phone) [phone] (fax)  [facility]

## 2022-12-02 LAB — LIPID PANEL
Chol/HDL Ratio: 2.7 ratio (ref 0.0–5.0)
Cholesterol, Total: 96 mg/dL — ABNORMAL LOW (ref 100–199)
HDL: 36 mg/dL — ABNORMAL LOW (ref >39)
LDL Chol Calc (NIH): 42 mg/dL (ref 0–99)
Triglycerides: 92 mg/dL (ref 0–149)
VLDL Cholesterol Cal: 18 mg/dL (ref 5–40)

## 2022-12-02 LAB — HEPATIC FUNCTION PANEL
ALT: 38 IU/L (ref 0–44)
AST: 25 IU/L (ref 0–40)
Albumin: 4.6 g/dL (ref 3.8–4.9)
Alkaline Phosphatase: 49 IU/L (ref 44–121)
Bilirubin Total: 0.9 mg/dL (ref 0.0–1.2)
Bilirubin, Direct: 0.25 mg/dL (ref 0.00–0.40)
Total Protein: 6.6 g/dL (ref 6.0–8.5)

## 2022-12-03 ENCOUNTER — Encounter: Payer: Self-pay | Admitting: *Deleted

## 2022-12-07 NOTE — Assessment & Plan Note (Signed)
Increased HCTZ to 50 mg daily. Limit sodium intake and increase water.

## 2022-12-07 NOTE — Assessment & Plan Note (Signed)
Use advair twice daily  Use rescue inhaler as needed and as prescribed  

## 2022-12-07 NOTE — Assessment & Plan Note (Signed)
Doing well on current dose of testosterone.  

## 2022-12-07 NOTE — Assessment & Plan Note (Signed)
Increase diltiazem to 90 mg TID.  -Patient back on losartan 50 mg -HCTZ increased to 50 mg daily. Continue with lifestyle changes.  Monitor bp at home. - he understands the recommendation if for his blood pressure to be 130/80 or better  -Regular follow-up with cardiology as scheduled.

## 2022-12-07 NOTE — Assessment & Plan Note (Signed)
Increase diltiazem to 90 mg three times daily..  Continue eliquis twice daily  Continue regular visits with cardiology.

## 2022-12-16 NOTE — Patient Instructions (Signed)
Medication Instructions:  Your physician has recommended you make the following change in your medication:   -Decrease losartan (cozaar) to 25mg  once daily.  -Stop taking rosuvastatin (crestor).  *If you need a refill on your cardiac medications before your next appointment, please call your pharmacy*   Lab Work: Your physician recommends that you return for lab work in: 2 months for FASTING lipid panel   If you have labs (blood work) drawn today and your tests are completely normal, you will receive your results only by: MyChart Message (if you have MyChart) OR A paper copy in the mail If you have any lab test that is abnormal or we need to change your treatment, we will call you to review the results.   Follow-Up: At [facility], you and your health needs are our priority.  As part of our continuing mission to provide you with exceptional heart care, we have created designated Provider Care Teams.  These Care Teams include your primary Cardiologist (physician) and Advanced Practice Providers (APPs -  Physician Assistants and Nurse Practitioners) who all work together to provide you with the care you need, when you need it.  We recommend signing up for the patient portal called "MyChart".  Sign up information is provided on this After Visit Summary.  MyChart is used to connect with patients for Virtual Visits (Telemedicine).  Patients are able to view lab/test results, encounter notes, upcoming appointments, etc.  Non-urgent messages can be sent to your provider as well.   To learn more about what you can do with MyChart, go to [email or web address].    Your next appointment:   3 month(s)  Provider:   Olga Millers, MD  or Edd Fabian, FNP, Marjie Skiff, PA-C, Azalee Course, PA-C, or Bernadene Person, NP       Other Instructions Please keep a blood pressure log and bring this with you to your next appointment.  HOW TO TAKE YOUR BLOOD PRESSURE: Rest 5 minutes before taking  your blood pressure. Don't smoke or drink caffeinated beverages for at least 30 minutes before. Take your blood pressure before (not after) you eat. Sit comfortably with your back supported and both feet on the floor (don't cross your legs). Elevate your arm to heart level on a table or a desk. Use the proper sized cuff. It should fit smoothly and snugly around your bare upper arm. There should be enough room to slip a fingertip under the cuff. The bottom edge of the cuff should be 1 inch above the crease of the elbow. Ideally, take 3 measurements at one sitting and record the average.  Verdon Cummins also recommends keeping either a tight sock or compression stocking on right leg. Continue to avoid salt in your diet and elevate your leg whenever you can.

## 2022-12-16 NOTE — Progress Notes (Signed)
Cardiology Clinic Note   Patient Name: Douglas Whitney Date of Encounter: 12/18/2022  Primary Care Provider:  Melida Quitter, PA Primary Cardiologist:  Olga Millers, MD  Patient Profile    Douglas Whitney 52 year old male presents to the clinic today for follow-up evaluation of his hypertension, lower remedy edema, and atrial fibrillation..  Past Medical History    Past Medical History:  Diagnosis Date   Acid reflux    Hypertension    Seasonal allergies    Sinusitis    Snores    Wears glasses    Past Surgical History:  Procedure Laterality Date   CARDIOVERSION N/A 10/07/2022   Procedure: CARDIOVERSION;  Surgeon: Thurmon Fair, MD;  Location: [facility] INVASIVE [facility];  Service: Cardiovascular;  Laterality: N/A;   CHOLECYSTECTOMY N/A 08/01/2015   Procedure: LAPAROSCOPIC CHOLECYSTECTOMY WITH INTRAOPERATIVE CHOLANGIOGRAM;  Surgeon: Darnell Level, MD;  Location: [facility] OR;  Service: General;  Laterality: N/A;   ESOPHAGOGASTRODUODENOSCOPY  2017   FINGER AMPUTATION Left 1983   cut off tip index finger   FRACTURE SURGERY     I & D EXTREMITY Left 05/06/2018   Procedure: IRRIGATION AND DEBRIDEMENT SMALL FINGER;  Surgeon: Teryl Lucy, MD;  Location: [facility] OR;  Service: Orthopedics;  Laterality: Left;   JOINT REPLACEMENT     NASAL SEPTOPLASTY W/ TURBINOPLASTY Bilateral 04/14/2013   Procedure: NASAL SEPTOPLASTY BILATERAL INFERIOR TURBINATE REDUCTION ;  Surgeon: Osborn Coho, MD;  Location: [facility];  Service: ENT;  Laterality: Bilateral;   ORIF FINGER FRACTURE Left 1984   middle   TOTAL HIP ARTHROPLASTY Right 03/2016   WISDOM TOOTH EXTRACTION      Allergies  No Known Allergies  History of Present Illness    Douglas Whitney is a PMH of new onset atrial fibrillation, hypertension, and palpitations.  He was seen in follow-up by Dr. Azucena Cecil on 03/04/2021.  During that time he was continued on his lisinopril/HCTZ regimen.  His blood pressure was  well-controlled.  Low-sodium diet was reviewed.  He had worn a cardiac event monitor which showed no evidence of atrial fibrillation.  He reported that his palpitations had improved.  He was seen by his PCP on 09/16/2022.  He was noted to be in atrial fibrillation, have lower respiratory infection, and have elevated blood pressure.  He was started on doxycycline, diltiazem, HCTZ 12.5, and his lisinopril was stopped.  Cardiology consult was recommended.  He presented to the clinic 09/17/22 for follow-up evaluation and stated he recently was diagnosed with upper respiratory tract infection.  He started doxycycline the day before.  At his PCP he was noted to be in atrial fibrillation.  He was started on low-dose apixaban at that time as well as immediate release diltiazem.  We reviewed his medications.  I instructed him to complete his antibiotics.  We reviewed options for treatment of his atrial fibrillation.  He and his daughter-in-law expressed understanding.  I changed his diltiazem to extended release 300 mg daily and increased his apixaban to 5 mg twice daily.  We reviewed triggers for atrial fibrillation.  I  planned cardioversion after 3 weeks of anticoagulation and planned follow-up for after DCCV.  He was instructed to contact the office if he did not recover from his URI so that his DCCV may be postponed.  He was seen in follow-up by Dr. Jens Som 10/14/2022.  He had undergone cardioversion 10/07/2022.  He denied dyspnea on exertion, chest pain or palpitations.  He denied syncope and bleeding issues.  He was feeling much better after his cardioversion.  His Cardizem and apixaban were continued.  His CHA2DS2-VASc score was noted to be 2 for coronary artery disease and hypertension.  Echocardiogram was ordered and showed EF, G1 DD, mildly dilated left atria and no significant valvular abnormalities.  He presents to the clinic today for follow-up evaluation and states while he was on vacation he noted that his  right ankle began to swell more.Marland Kitchen  He reports that when he wakes up in the morning his swelling has decreased.  He also notes when he wears a tight sock in his work boots that his right ankle is much less swollen.  We reviewed his medication and he notes that his rosuvastatin has caused him to have joint aches.  He also feels that his blood pressure is too low after adding losartan.  I explained benefits of good cholesterol management.  He expressed understanding.  He wishes to defer statin therapy at this time and would like to work more on his diet, physical activity, and over-the-counter supplementation.  I will repeat his fasting lipids in 8 weeks, decrease his losartan to 25 mg daily, discontinue his rosuvastatin and plan follow-up in 3 months.  He has been using Kardia mobile to check his heart rate.  He reports that with checking his heart rate according to his device has been normal.  He has noted about 3 episodes of irregular heartbeat and has been converting back to sinus rhythm within 10 to 15 minutes.  Today he denies chest pain,  fatigue, palpitations, melena, hematuria, hemoptysis, diaphoresis, weakness, presyncope, syncope, orthopnea, and PND.    Home Medications    Prior to Admission medications   Medication Sig Start Date End Date Taking? Authorizing Provider  albuterol (VENTOLIN HFA) 108 (90 Base) MCG/ACT inhaler INHALE 1 TO 2 PUFFS INTO THE LUNGS EVERY 6 HOURS AS NEEDED FOR WHEEZING OR SHORTNESS OF BREATH. 08/25/22   Carlean Jews, NP  apixaban (ELIQUIS) 2.5 MG TABS tablet Take 1 tablet (2.5 mg total) by mouth 2 (two) times daily. 09/09/22   Carlean Jews, NP  azelastine (ASTELIN) 0.1 % nasal spray Place 2 sprays into both nostrils 2 (two) times daily. Use in each nostril as directed 08/25/22   Carlean Jews, NP  cefUROXime (CEFTIN) 500 MG tablet Take 1 tablet (500 mg total) by mouth 2 (two) times daily with a meal. 08/25/21   Carlean Jews, NP  clotrimazole-betamethasone  (LOTRISONE) cream Apply 1 Application topically 2 (two) times daily. 12/15/21   Carlean Jews, NP  diltiazem (CARDIZEM) 90 MG tablet Take 1 tablet (90 mg total) by mouth 3 (three) times daily. 09/16/22   Carlean Jews, NP  doxycycline (VIBRA-TABS) 100 MG tablet Take 1 tablet (100 mg total) by mouth 2 (two) times daily. 09/16/22   Carlean Jews, NP  fluticasone furoate-vilanterol (BREO ELLIPTA) 100-25 MCG/ACT AEPB Inhale 1 puff into the lungs daily. 08/26/22   Carlean Jews, NP  hydrochlorothiazide (HYDRODIURIL) 12.5 MG tablet Take 1 tablet (12.5 mg total) by mouth daily. 09/16/22   Carlean Jews, NP  Multiple Vitamin (MULTIVITAMIN WITH MINERALS) TABS tablet Take 1 tablet by mouth daily.    [provider]  testosterone cypionate (DEPOTESTOSTERONE CYPIONATE) 200 MG/ML injection INJECT (1 MILLILITER) 200 MG SUBCUTANEOUSLY ONCE WEEKLY 04/02/22   Carlean Jews, NP    Family History    Family History  Problem Relation Age of Onset   High blood pressure Father  Stroke Other    He indicated that the status of his father is unknown. He indicated that the status of his other is unknown.  Social History    Social History   Socioeconomic History   Marital status: Married    Spouse name: Not on file   Number of children: 2   Years of education: Not on file   Highest education level: Not on file  Occupational History   Not on file  Tobacco Use   Smoking status: Former    Current packs/day: 0.00    Average packs/day: 2.0 packs/day for 6.0 years (12.0 ttl pk-yrs)    Types: Cigarettes    Start date: 4    Quit date: 68    Years since quitting: 26.5   Smokeless tobacco: Former    Types: Chew, Snuff    Quit date: 1999  Vaping Use   Vaping status: Never Used  Substance and Sexual Activity   Alcohol use: Never    Comment: 05/05/2018 "maybe 2 drinks/year"   Drug use: No   Sexual activity: Yes    Partners: Female  Other Topics Concern   Not on file   Social History Narrative   Not on file   Social Determinants of Health   Financial Resource Strain: Not on file  Food Insecurity: Not on file  Transportation Needs: Not on file  Physical Activity: Not on file  Stress: Not on file  Social Connections: Not on file  Intimate Partner Violence: Not on file     Review of Systems    General:  No chills, fever, night sweats or weight changes.  Cardiovascular:  No chest pain, dyspnea on exertion, edema, orthopnea, palpitations, paroxysmal nocturnal dyspnea. Dermatological: No rash, lesions/masses Respiratory: No cough, dyspnea Urologic: No hematuria, dysuria Abdominal:   No nausea, vomiting, diarrhea, bright red blood per rectum, melena, or hematemesis Neurologic:  No visual changes, wkns, changes in mental status. All other systems reviewed and are otherwise negative except as noted above.  Physical Exam    VS:  BP 112/68   Pulse 77   Ht 6\' 4"  (1.93 m)   Wt 284 lb 3.2 oz (128.9 kg)   SpO2 99%   BMI 34.59 kg/m  , BMI Body mass index is 34.59 kg/m. GEN: Well nourished, well developed, in no acute distress. HEENT: normal. Neck: Supple, no JVD, carotid bruits, or masses. Cardiac: RRR, no murmurs, rubs, or gallops. No clubbing, cyanosis, edema.  Radials/DP/PT 2+ and equal bilaterally.  Respiratory:  Respirations regular and unlabored, clear to auscultation bilaterally. GI: Soft, nontender, nondistended, BS + x 4. MS: no deformity or atrophy. Skin: warm and dry, no rash. Neuro:  Strength and sensation are intact. Psych: Normal affect.  Accessory Clinical Findings    Recent Labs: 10/21/2022: BUN 14; Creatinine, Ser 0.96; Potassium 4.0; Sodium 138 11/18/2022: Hemoglobin 18.1; Platelets 296; TSH 1.690 12/01/2022: ALT 38   Recent Lipid Panel    Component Value Date/Time   CHOL 96 (L) 12/01/2022 0816   TRIG 92 12/01/2022 0816   HDL 36 (L) 12/01/2022 0816   CHOLHDL 2.7 12/01/2022 0816   LDLCALC 42 12/01/2022 0816          ECG personally reviewed by me today-none today.  EKG 09/17/2022 atrial fibrillation 73 bpm  Cardiac event monitor 01/16/2021  Patch Wear Time:  2 days and 11 hours (2022-08-05T09:55:23-0400 to 2022-08-07T21:22:15-0400)   Patient had a min HR of 48 bpm, max HR of 141 bpm, and avg HR of 85  bpm. Predominant underlying rhythm was Sinus Rhythm. Isolated SVEs were rare (<1.0%), SVE Triplets were rare (<1.0%), and no SVE Couplets were present. Isolated VEs were rare (<1.0%),  and no VE Couplets or VE Triplets were present.  No evidence of arrhythmias.  Patient triggered events associated with sinus rhythm, sinus tachycardia, overall benign cardiac monitor.  Echocardiogram 10/21/2022  IMPRESSIONS   1. Left ventricular ejection fraction, by estimation, is 60 to 65%. The left ventricle has normal function. The left ventricle has no regional wall motion abnormalities. Left ventricular diastolic parameters are consistent with Grade I diastolic dysfunction (impaired relaxation). The average left ventricular global longitudinal strain is -20.3 %. The global longitudinal strain is normal. 2. Right ventricular systolic function is normal. The right ventricular size is normal. There is normal pulmonary artery systolic pressure. The estimated right ventricular systolic pressure is 30.5 mmHg. 3. Left atrial size was mildly dilated. 4. The mitral valve is normal in structure. No evidence of mitral valve regurgitation. No evidence of mitral stenosis. 5. The aortic valve is tricuspid. Aortic valve regurgitation is not visualized. Aortic valve sclerosis is present, with no evidence of aortic valve stenosis. 6. Aortic dilatation noted. There is borderline dilatation of the ascending aorta, measuring 40 mm. 7. The inferior vena cava is normal in size with greater than 50% respiratory variability, suggesting right atrial pressure of 3 mmHg.  Comparison(s): No prior Echocardiogram.  FINDINGS Left Ventricle:  Left ventricular ejection fraction, by estimation, is 60 to 65%. The left ventricle has normal function. The left ventricle has no regional wall motion abnormalities. The average left ventricular global longitudinal strain is -20.3 %. The global longitudinal strain is normal. 3D ejection fraction reviewed and evaluated as part of the interpretation. Alternate measurement of EF is felt to be most reflective of LV function. The left ventricular internal cavity size was normal in size. There is no left ventricular hypertrophy. Left ventricular diastolic parameters are consistent with Grade I diastolic dysfunction (impaired relaxation).  Right Ventricle: The right ventricular size is normal. No increase in right ventricular wall thickness. Right ventricular systolic function is normal. There is normal pulmonary artery systolic pressure. The tricuspid regurgitant velocity is 2.62 m/s, and with an assumed right atrial pressure of 3 mmHg, the estimated right ventricular systolic pressure is 30.5 mmHg.  Left Atrium: Left atrial size was mildly dilated.  Right Atrium: Right atrial size was normal in size.  Pericardium: There is no evidence of pericardial effusion.  Mitral Valve: The mitral valve is normal in structure. No evidence of mitral valve regurgitation. No evidence of mitral valve stenosis.  Tricuspid Valve: The tricuspid valve is normal in structure. Tricuspid valve regurgitation is trivial.  Aortic Valve: The aortic valve is tricuspid. Aortic valve regurgitation is not visualized. Aortic valve sclerosis is present, with no evidence of aortic valve stenosis.  Pulmonic Valve: The pulmonic valve was normal in structure. Pulmonic valve regurgitation is trivial.  Aorta: Aortic dilatation noted. There is borderline dilatation of the ascending aorta, measuring 40 mm.  Venous: The inferior vena cava is normal in size with greater than 50% respiratory variability, suggesting right atrial pressure of 3  mmHg.  IAS/Shunts: The atrial septum is grossly normal.    Assessment & Plan   Right, Lower extremity edema-generalized nonpitting edema.  Appears to be related to dependent edema.    Reports compliance with apixaban.  Low suspicion for DVT.  Denies trauma.  No signs of wound.  No signs of infection. Lower extremity support stocking Low-sodium  diet Elevate lower extremity when not active  Atrial fibrillation, palpitations-heart rate today 77 BPM. RRR. Underwent successful DCCV on 10/07/2022.    CHA2DS2-VASc score 2.  Reports compliance with apixaban.  Denies bleeding issues. Avoid triggers  Continue apixaban, diltiazem  Essential hypertension-BP today 112/68 Continue HCTZ, diltiazem Decrease losartan 25 mg daily Heart healthy low-sodium diet  Hyperlipidemia-LDL 42 on 12/01/22. Joint aches with rosuvastatin.  Would like to try to work on diet and increase physical activity.  Also plans to increase his fish oil supplementation. High-fiber diet Discontinue rosuvastatin. Repeat fasting lipids in 8 weeks Wishes to work on diet and maintain physical activity  Disposition: Follow-up with Dr. Jens Som or me in 3 months  Thomasene Ripple. Zakariyya Helfman NP-C     12/18/2022, 4:00 PM [facility] [address] Office [phone] Fax [phone]    I spent 15 minutes examining this patient, reviewing medications, and using patient centered shared decision making involving her cardiac care.  Prior to her visit I spent greater than 20 minutes reviewing her past medical history,  medications, and prior cardiac tests.

## 2022-12-18 ENCOUNTER — Encounter: Payer: Self-pay | Admitting: General Practice

## 2022-12-18 ENCOUNTER — Ambulatory Visit: Payer: BC Managed Care – PPO | Admitting: General Practice

## 2022-12-18 VITALS — BP 112/68 | HR 77 | Ht 76.0 in | Wt 284.2 lb

## 2022-12-18 DIAGNOSIS — E7800 Pure hypercholesterolemia, unspecified: Secondary | ICD-10-CM | POA: Diagnosis not present

## 2022-12-18 DIAGNOSIS — R600 Localized edema: Secondary | ICD-10-CM

## 2022-12-18 DIAGNOSIS — I10 Essential (primary) hypertension: Secondary | ICD-10-CM | POA: Diagnosis not present

## 2022-12-18 DIAGNOSIS — I4891 Unspecified atrial fibrillation: Secondary | ICD-10-CM

## 2022-12-18 MED ORDER — LOSARTAN POTASSIUM 50 MG PO TABS
50.0000 mg | ORAL_TABLET | Freq: Every day | ORAL | 3 refills | Status: DC
Start: 2022-12-18 — End: 2023-03-22

## 2023-01-26 ENCOUNTER — Ambulatory Visit: Payer: BC Managed Care – PPO | Admitting: Family Medicine

## 2023-01-26 ENCOUNTER — Encounter: Payer: Self-pay | Admitting: Family Medicine

## 2023-01-26 VITALS — BP 149/88 | HR 82 | Temp 98.4°F | Resp 18 | Ht 76.0 in | Wt 272.0 lb

## 2023-01-26 DIAGNOSIS — J4520 Mild intermittent asthma, uncomplicated: Secondary | ICD-10-CM

## 2023-01-26 DIAGNOSIS — R051 Acute cough: Secondary | ICD-10-CM

## 2023-01-26 MED ORDER — ADVAIR DISKUS 100-50 MCG/ACT IN AEPB
1.0000 | INHALATION_SPRAY | Freq: Two times a day (BID) | RESPIRATORY_TRACT | 1 refills | Status: DC
Start: 2023-01-26 — End: 2023-03-29

## 2023-01-26 NOTE — Patient Instructions (Addendum)
Continue using your rescue inhaler as needed.  We will start with a chest x-ray to figure out if we need to start any antibiotics.  Otherwise, I would recommend using Tylenol or ibuprofen tonight for any pain/discomfort.  Once we have the chest x-ray results, I will let you know if there is anything else that we can add on.  Stay hydrated, rest when you need to.  If you do need to go in public, wear a mask to prevent getting anyone else sick.  You may go to work as long as you limit contact with other people.  We will try to get the Advair inhaler ordered so you can restart it.  If insurance will not cover it and it is too expensive out-of-pocket, just let me know and we can try the higher dose of the Breo inhaler.  It may also be worthwhile to try a daily medication called montelukast that can help with asthma control, but we can discuss that at a later time.

## 2023-01-26 NOTE — Progress Notes (Signed)
Acute Office Visit  Subjective:     Patient ID: Douglas Whitney, male    DOB: 1970-10-05, 52 y.o.   MRN: [id number]  Chief Complaint  Patient presents with   URI    HPI Patient is in today for URI symptoms.  He initially experienced ear fullness the week of July 4.  This did resolve but several weeks later he developed a cough.  The cough started about 2 weeks ago and has been productive with milky white sputum.  He finds that the cough tends to be worse in the evening and at night.  He also endorses occasional shortness of breath, fatigue, and chest pain that he states feels like sore muscles.  He states that he gets pneumonia almost annually, and he is suspicious that this may be another episode.  Of note, he had a lower respiratory infection in May 2024, put on a course of doxycycline 100 mg twice daily for 10 days.  Review of Systems  Constitutional:  Positive for malaise/fatigue. Negative for chills and fever.  HENT:  Negative for hearing loss.   Eyes:  Negative for blurred vision.  Respiratory:  Positive for sputum production and shortness of breath.   Cardiovascular:  Negative for palpitations and leg swelling.  Musculoskeletal:  Negative for myalgias.  Neurological:  Negative for dizziness.      Objective:    BP (!) 149/88 (BP Location: Left Arm, Patient Position: Sitting, Cuff Size: Large)   Pulse 82   Temp 98.4 F (36.9 C) (Oral)   Resp 18   Ht 6\' 4"  (1.93 m)   Wt 272 lb (123.4 kg)   SpO2 97%   BMI 33.11 kg/m   Physical Exam Constitutional:      General: He is not in acute distress.    Appearance: Normal appearance. He is not ill-appearing.  HENT:     Head: Normocephalic and atraumatic.     Right Ear: Tympanic membrane, ear canal and external ear normal.     Left Ear: Tympanic membrane, ear canal and external ear normal.     Nose: Nose normal. No congestion or rhinorrhea.     Mouth/Throat:     Mouth: Mucous membranes are moist.     Pharynx: Oropharynx is  clear. No oropharyngeal exudate or posterior oropharyngeal erythema.  Eyes:     General:        Right eye: No discharge.        Left eye: No discharge.     Conjunctiva/sclera: Conjunctivae normal.     Pupils: Pupils are equal, round, and reactive to light.  Cardiovascular:     Rate and Rhythm: Normal rate and regular rhythm.     Heart sounds: Normal heart sounds. No murmur heard.    No friction rub. No gallop.  Pulmonary:     Effort: Pulmonary effort is normal. No respiratory distress.     Breath sounds: Rhonchi (RLL, LLL) present. No wheezing or rales.  Skin:    General: Skin is warm and dry.  Neurological:     Mental Status: He is alert and oriented to person, place, and time.  Psychiatric:        Mood and Affect: Mood normal.       Assessment & Plan:  Acute cough -     DG Chest 2 View; Future  Mild intermittent asthma without complication Assessment & Plan: Symptoms were previously best controlled on Advair daily inhaler, but patient states that he was unable to get this  the last time that he wanted a refill due to issues with paperwork.  Since then, he has been using the [facility] daily inhaler which is less effective.  He is using his rescue inhaler almost daily.  He would like to switch back to Advair if possible.  Sending prescription and initiating PA for Advair, if it is too expensive out-of-pocket then recommend Breo 200-25 daily inhaler.  Continue albuterol rescue inhaler as needed.  We may also consider adding montelukast 10 mg daily for improved maintenance.  Orders: -     Advair Diskus; Inhale 1 puff into the lungs 2 (two) times daily.  Dispense: 3 each; Refill: 1  Starting with chest x-ray to evaluate for pneumonia.  If positive, then recommend initiating Augmentin and azithromycin.  Recommended to maintain adequate hydration, rest as needed, take Tylenol or ibuprofen for pain/discomfort.  Pending chest x-ray results, may also continue adding on over-the-counter URI  medications such as guaifenesin to help break up mucus.  We discussed that a cough suppressant likely is not a good option as it may trapped sputum in the lungs.  Patient verbalized understanding and is agreeable to this plan.  Return if symptoms worsen or fail to improve.  Melida Quitter, PA

## 2023-01-26 NOTE — Assessment & Plan Note (Signed)
Symptoms were previously best controlled on Advair daily inhaler, but patient states that he was unable to get this the last time that he wanted a refill due to issues with paperwork.  Since then, he has been using the [facility] daily inhaler which is less effective.  He is using his rescue inhaler almost daily.  He would like to switch back to Advair if possible.  Sending prescription and initiating PA for Advair, if it is too expensive out-of-pocket then recommend Breo 200-25 daily inhaler.  Continue albuterol rescue inhaler as needed.  We may also consider adding montelukast 10 mg daily for improved maintenance.

## 2023-01-27 ENCOUNTER — Ambulatory Visit
Admission: RE | Admit: 2023-01-27 | Discharge: 2023-01-27 | Disposition: A | Discharge status: Home / Self Care | Payer: BC Managed Care – PPO | Source: Ambulatory Visit | Attending: Family Medicine | Admitting: Family Medicine

## 2023-01-27 ENCOUNTER — Other Ambulatory Visit: Payer: Self-pay | Admitting: Family Medicine

## 2023-01-27 DIAGNOSIS — R051 Acute cough: Secondary | ICD-10-CM

## 2023-01-27 DIAGNOSIS — J4520 Mild intermittent asthma, uncomplicated: Secondary | ICD-10-CM

## 2023-01-28 ENCOUNTER — Ambulatory Visit: Payer: BC Managed Care – PPO | Admitting: Family Medicine

## 2023-02-22 ENCOUNTER — Telehealth: Payer: Self-pay | Admitting: *Deleted

## 2023-02-22 NOTE — Telephone Encounter (Signed)
Pt calling to say that he is about of the inhaler Breo.  He stated that he thought provider was going to check about getting advair back.  He said he thought that provider was also going to increase the dosage.  Please advise

## 2023-02-22 NOTE — Telephone Encounter (Signed)
I sent a new prescription for the Advair inhaler to [place] [facility] on 01/26/2023.  He should check with them to see if they have that prescription, and if not I would be happy to send in Advair prescription again.

## 2023-02-24 NOTE — Telephone Encounter (Signed)
Contacted pharmacy on Monday to ask about the Rx, she said they had received it and that it was requiring a PA, she was going to submit through cover my meds.  If pt needed a refill before the outcome he was going to refill his breo.  Pt was informed of this.

## 2023-03-15 ENCOUNTER — Telehealth: Payer: Self-pay | Admitting: *Deleted

## 2023-03-15 NOTE — Telephone Encounter (Signed)
Opened in error. Pt calling to cancel appt.

## 2023-03-16 ENCOUNTER — Ambulatory Visit: Payer: BC Managed Care – PPO | Admitting: Family Medicine

## 2023-03-18 NOTE — Progress Notes (Unsigned)
Cardiology Clinic Note   Patient Name: Douglas Whitney Date of Encounter: 03/22/2023  Primary Care Provider:  Melida Quitter, PA Primary Cardiologist:  Olga Millers, MD  Patient Profile    Douglas Whitney 52 year old male presents to the clinic today for follow-up evaluation of his atrial fibrillation..  Past Medical History    Past Medical History:  Diagnosis Date   Acid reflux    Hypertension    Seasonal allergies    Sinusitis    Snores    Wears glasses    Past Surgical History:  Procedure Laterality Date   CARDIOVERSION N/A 10/07/2022   Procedure: CARDIOVERSION;  Surgeon: Thurmon Fair, MD;  Location: [facility];  Service: Cardiovascular;  Laterality: N/A;   CHOLECYSTECTOMY N/A 08/01/2015   Procedure: LAPAROSCOPIC CHOLECYSTECTOMY WITH INTRAOPERATIVE CHOLANGIOGRAM;  Surgeon: Darnell Level, MD;  Location: [facility] OR;  Service: General;  Laterality: N/A;   ESOPHAGOGASTRODUODENOSCOPY  2017   FINGER AMPUTATION Left 1983   cut off tip index finger   FRACTURE SURGERY     I & D EXTREMITY Left 05/06/2018   Procedure: IRRIGATION AND DEBRIDEMENT SMALL FINGER;  Surgeon: Teryl Lucy, MD;  Location: [facility] OR;  Service: Orthopedics;  Laterality: Left;   JOINT REPLACEMENT     NASAL SEPTOPLASTY W/ TURBINOPLASTY Bilateral 04/14/2013   Procedure: NASAL SEPTOPLASTY BILATERAL INFERIOR TURBINATE REDUCTION ;  Surgeon: Osborn Coho, MD;  Location: [facility];  Service: ENT;  Laterality: Bilateral;   ORIF FINGER FRACTURE Left 1984   middle   TOTAL HIP ARTHROPLASTY Right 03/2016   WISDOM TOOTH EXTRACTION      Allergies  No Known Allergies  History of Present Illness    Douglas Whitney is a PMH of new onset atrial fibrillation, hypertension, and palpitations.  He was seen in follow-up by Dr. Azucena Cecil on 03/04/2021.  During that time he was continued on his lisinopril/HCTZ regimen.  His blood pressure was well-controlled.  Low-sodium diet was reviewed.  He  had worn a cardiac event monitor which showed no evidence of atrial fibrillation.  He reported that his palpitations had improved.     He was seen by his PCP on 09/16/2022.  He was noted to be in atrial fibrillation, have lower respiratory infection, and have elevated blood pressure.  He was started on doxycycline, diltiazem, HCTZ 12.5, and his lisinopril was stopped.  Cardiology consult was recommended.  He presented to the clinic 09/17/22 for follow-up evaluation and stated he recently was diagnosed with upper respiratory tract infection.  He started doxycycline the day before.  At his PCP he was noted to be in atrial fibrillation.  He was started on low-dose apixaban at that time as well as immediate release diltiazem.  We reviewed his medications.  I instructed him to complete his antibiotics.  We reviewed options for treatment of his atrial fibrillation.  He and his daughter-in-law expressed understanding.  I changed his diltiazem to extended release 300 mg daily and increased his apixaban to 5 mg twice daily.  We reviewed triggers for atrial fibrillation.  I  planned cardioversion after 3 weeks of anticoagulation and planned follow-up for after DCCV.  He was instructed to contact the office if he did not recover from his URI so that his DCCV may be postponed.  He was seen in follow-up by Dr. Jens Som 10/14/2022.  He had undergone cardioversion 10/07/2022.  He denied dyspnea on exertion, chest pain or palpitations.  He denied syncope and bleeding issues.  He  was feeling much better after his cardioversion.  His Cardizem and apixaban were continued.  His CHA2DS2-VASc score was noted to be 2 for coronary artery disease and hypertension.  Echocardiogram was ordered and showed EF, G1 DD, mildly dilated left atria and no significant valvular abnormalities.      He presented to the clinic 12/18/22 for follow-up evaluation and stated while he was on vacation he noted that his right ankle began to swell more.  He  reported that when he woke up in the morning his swelling had decreased.  He also noted when he wore a tight sock in his work boots that his right ankle was much less swollen.  We reviewed his medication and he noted that his rosuvastatin had caused him to have joint aches.  He also felt that his blood pressure was too low after adding losartan.  I explained benefits of good cholesterol management.  He expressed understanding.  He wished to defer statin therapy at the time and wanted  to work more on his diet, physical activity, and over-the-counter supplementation.  I planned repeat fasting lipids in 8 weeks, decreased his losartan to 25 mg daily, discontinued his rosuvastatin and planned follow-up in 3 months.  He had been using Kardia mobile to check his heart rate.  He reported normal heart rates.  He had noted about 3 episodes of irregular heartbeat and had been converting back to sinus rhythm within 10 to 15 minutes.  He presents to the clinic today for follow-up evaluation and states he is doing well.  He has been helping with the recovery working in [employer].  His blood pressure today is 138/84.  His blood pressure at home has been in the 100 teens-30s over 80s.  We reviewed his rosuvastatin intolerance.  After seeing me last and stopping the medication he has not had any further joint aches.  He does notice intermittent chest discomfort that appears to be musculoskeletal.  He notices this discomfort with lifting heavier objects and knee boarding type activities.  It is not tender to palpation today.  His EKG shows sinus rhythm 77 bpm.  He reports that he generally feels very well.  [place]  We will repeat his fasting lipids today and plan follow-up in 6 months.  Today he denies chest pain,  fatigue, palpitations, melena, hematuria, hemoptysis, diaphoresis, weakness, presyncope, syncope, orthopnea, and PND.    Home Medications    Prior to  Admission medications   Medication Sig Start Date End Date Taking? Authorizing Provider  albuterol (VENTOLIN HFA) 108 (90 Base) MCG/ACT inhaler INHALE 1 TO 2 PUFFS INTO THE LUNGS EVERY 6 HOURS AS NEEDED FOR WHEEZING OR SHORTNESS OF BREATH. 08/25/22   Carlean Jews, NP  apixaban (ELIQUIS) 2.5 MG TABS tablet Take 1 tablet (2.5 mg total) by mouth 2 (two) times daily. 09/09/22   Carlean Jews, NP  azelastine (ASTELIN) 0.1 % nasal spray Place 2 sprays into both nostrils 2 (two) times daily. Use in each nostril as directed 08/25/22   Carlean Jews, NP  cefUROXime (CEFTIN) 500 MG tablet Take 1 tablet (500 mg total) by mouth 2 (two) times daily with a meal. 08/25/21   Carlean Jews, NP  clotrimazole-betamethasone (LOTRISONE) cream Apply 1 Application topically 2 (two) times daily. 12/15/21   Carlean Jews, NP  diltiazem (CARDIZEM) 90 MG tablet Take 1 tablet (90 mg total) by mouth 3 (three) times daily.  09/16/22   Carlean Jews, NP  doxycycline (VIBRA-TABS) 100 MG tablet Take 1 tablet (100 mg total) by mouth 2 (two) times daily. 09/16/22   Carlean Jews, NP  fluticasone furoate-vilanterol (BREO ELLIPTA) 100-25 MCG/ACT AEPB Inhale 1 puff into the lungs daily. 08/26/22   Carlean Jews, NP  hydrochlorothiazide (HYDRODIURIL) 12.5 MG tablet Take 1 tablet (12.5 mg total) by mouth daily. 09/16/22   Carlean Jews, NP  Multiple Vitamin (MULTIVITAMIN WITH MINERALS) TABS tablet Take 1 tablet by mouth daily.    [provider]  testosterone cypionate (DEPOTESTOSTERONE CYPIONATE) 200 MG/ML injection INJECT (1 MILLILITER) 200 MG SUBCUTANEOUSLY ONCE WEEKLY 04/02/22   Carlean Jews, NP    Family History    Family History  Problem Relation Age of Onset   High blood pressure Father    Stroke Other    He indicated that the status of his father is unknown. He indicated that the status of his other is unknown.  Social History    Social History   Socioeconomic History   Marital  status: Married    Spouse name: Not on file   Number of children: 2   Years of education: Not on file   Highest education level: Not on file  Occupational History   Not on file  Tobacco Use   Smoking status: Former    Current packs/day: 0.00    Average packs/day: 2.0 packs/day for 6.0 years (12.0 ttl pk-yrs)    Types: Cigarettes    Start date: 8    Quit date: 64    Years since quitting: 26.8    Passive exposure: Past   Smokeless tobacco: Former    Types: Chew, Snuff    Quit date: 1999  Vaping Use   Vaping status: Never Used  Substance and Sexual Activity   Alcohol use: Never    Comment: 05/05/2018 "maybe 2 drinks/year"   Drug use: No   Sexual activity: Yes    Partners: Female  Other Topics Concern   Not on file  Social History Narrative   Not on file   Social Determinants of Health   Financial Resource Strain: Not on file  Food Insecurity: Not on file  Transportation Needs: Not on file  Physical Activity: Not on file  Stress: Not on file  Social Connections: Not on file  Intimate Partner Violence: Not on file     Review of Systems    General:  No chills, fever, night sweats or weight changes.  Cardiovascular:  No chest pain, dyspnea on exertion, edema, orthopnea, palpitations, paroxysmal nocturnal dyspnea. Dermatological: No rash, lesions/masses Respiratory: No cough, dyspnea Urologic: No hematuria, dysuria Abdominal:   No nausea, vomiting, diarrhea, bright red blood per rectum, melena, or hematemesis Neurologic:  No visual changes, wkns, changes in mental status. All other systems reviewed and are otherwise negative except as noted above.  Physical Exam    VS:  BP 138/84   Pulse 79   Ht 6\' 4"  (1.93 m)   Wt 270 lb 12.8 oz (122.8 kg)   SpO2 94%   BMI 32.96 kg/m  , BMI Body mass index is 32.96 kg/m. GEN: Well nourished, well developed, in no acute distress. HEENT: normal. Neck: Supple, no JVD, carotid bruits, or masses. Cardiac: RRR, no murmurs,  rubs, or gallops. No clubbing, cyanosis, edema.  Radials/DP/PT 2+ and equal bilaterally.  Respiratory:  Respirations regular and unlabored, clear to auscultation bilaterally. GI: Soft, nontender, nondistended, BS + x 4. MS: no deformity  or atrophy. Skin: warm and dry, no rash. Neuro:  Strength and sensation are intact. Psych: Normal affect.  Accessory Clinical Findings    Recent Labs: 10/21/2022: BUN 14; Creatinine, Ser 0.96; Potassium 4.0; Sodium 138 11/18/2022: Hemoglobin 18.1; Platelets 296; TSH 1.690 12/01/2022: ALT 38   Recent Lipid Panel    Component Value Date/Time   CHOL 96 (L) 12/01/2022 0816   TRIG 92 12/01/2022 0816   HDL 36 (L) 12/01/2022 0816   CHOLHDL 2.7 12/01/2022 0816   LDLCALC 42 12/01/2022 0816         ECG personally reviewed by me today-EKG Interpretation Date/Time:  Monday March 22 2023 09:00:33 EDT Ventricular Rate:  77 PR Interval:  202 QRS Duration:  96 QT Interval:  388 QTC Calculation: 439 R Axis:   43  Text Interpretation: Normal sinus rhythm Normal ECG When compared with ECG of 07-Oct-2022 12:14, PR interval has decreased Left posterior fascicular block is no longer Present Criteria for Inferior infarct are no longer Present Nonspecific T wave abnormality no longer evident in Inferior leads Confirmed by Edd Fabian [phone]) on 03/22/2023 9:03:44 AM   EKG 09/17/2022 atrial fibrillation 73 bpm  Cardiac event monitor 01/16/2021  Patch Wear Time:  2 days and 11 hours (2022-08-05T09:55:23-0400 to 2022-08-07T21:22:15-0400)   Patient had a min HR of 48 bpm, max HR of 141 bpm, and avg HR of 85 bpm. Predominant underlying rhythm was Sinus Rhythm. Isolated SVEs were rare (<1.0%), SVE Triplets were rare (<1.0%), and no SVE Couplets were present. Isolated VEs were rare (<1.0%),  and no VE Couplets or VE Triplets were present.  No evidence of arrhythmias.  Patient triggered events associated with sinus rhythm, sinus tachycardia, overall benign cardiac  monitor.  Echocardiogram 10/21/2022  IMPRESSIONS   1. Left ventricular ejection fraction, by estimation, is 60 to 65%. The left ventricle has normal function. The left ventricle has no regional wall motion abnormalities. Left ventricular diastolic parameters are consistent with Grade I diastolic dysfunction (impaired relaxation). The average left ventricular global longitudinal strain is -20.3 %. The global longitudinal strain is normal. 2. Right ventricular systolic function is normal. The right ventricular size is normal. There is normal pulmonary artery systolic pressure. The estimated right ventricular systolic pressure is 30.5 mmHg. 3. Left atrial size was mildly dilated. 4. The mitral valve is normal in structure. No evidence of mitral valve regurgitation. No evidence of mitral stenosis. 5. The aortic valve is tricuspid. Aortic valve regurgitation is not visualized. Aortic valve sclerosis is present, with no evidence of aortic valve stenosis. 6. Aortic dilatation noted. There is borderline dilatation of the ascending aorta, measuring 40 mm. 7. The inferior vena cava is normal in size with greater than 50% respiratory variability, suggesting right atrial pressure of 3 mmHg.  Comparison(s): No prior Echocardiogram.  FINDINGS Left Ventricle: Left ventricular ejection fraction, by estimation, is 60 to 65%. The left ventricle has normal function. The left ventricle has no regional wall motion abnormalities. The average left ventricular global longitudinal strain is -20.3 %. The global longitudinal strain is normal. 3D ejection fraction reviewed and evaluated as part of the interpretation. Alternate measurement of EF is felt to be most reflective of LV function. The left ventricular internal cavity size was normal in size. There is no left ventricular hypertrophy. Left ventricular diastolic parameters are consistent with Grade I diastolic dysfunction (impaired relaxation).  Right Ventricle: The  right ventricular size is normal. No increase in right ventricular wall thickness. Right ventricular systolic function is normal. There  is normal pulmonary artery systolic pressure. The tricuspid regurgitant velocity is 2.62 m/s, and with an assumed right atrial pressure of 3 mmHg, the estimated right ventricular systolic pressure is 30.5 mmHg.  Left Atrium: Left atrial size was mildly dilated.  Right Atrium: Right atrial size was normal in size.  Pericardium: There is no evidence of pericardial effusion.  Mitral Valve: The mitral valve is normal in structure. No evidence of mitral valve regurgitation. No evidence of mitral valve stenosis.  Tricuspid Valve: The tricuspid valve is normal in structure. Tricuspid valve regurgitation is trivial.  Aortic Valve: The aortic valve is tricuspid. Aortic valve regurgitation is not visualized. Aortic valve sclerosis is present, with no evidence of aortic valve stenosis.  Pulmonic Valve: The pulmonic valve was normal in structure. Pulmonic valve regurgitation is trivial.  Aorta: Aortic dilatation noted. There is borderline dilatation of the ascending aorta, measuring 40 mm.  Venous: The inferior vena cava is normal in size with greater than 50% respiratory variability, suggesting right atrial pressure of 3 mmHg.  IAS/Shunts: The atrial septum is grossly normal.    Assessment & Plan   Atrial fibrillation, palpitations-heart rate today 79 BPM. RRR. DCCV on 10/07/2022.    CHA2DS2-VASc score 2.  Reports compliance with apixaban and denies bleeding issues. Avoid triggers-reviewed Continue apixaban, diltiazem Maintain physical activity  Hyperlipidemia-LDL 42 on 12/01/22. Joint aches with rosuvastatin.  Would like to try to work on diet and increase physical activity.  Also plans to increase his fish oil supplementation. High-fiber diet Repeat fasting lipids  Essential hypertension-BP today 138/84 Continue HCTZ, diltiazem Heart healthy low-sodium  diet   Disposition: Follow-up with Dr. Jens Som or me in 6-9 months  Thomasene Ripple. Dmarco Baldus NP-C     03/22/2023, 9:04 AM [facility] [address] Office [phone] Fax [phone]    I spent 14 minutes examining this patient, reviewing medications, and using patient centered shared decision making involving her cardiac care.  Prior to her visit I spent greater than 20 minutes reviewing her past medical history,  medications, and prior cardiac tests.

## 2023-03-19 ENCOUNTER — Other Ambulatory Visit: Payer: Self-pay | Admitting: General Practice

## 2023-03-19 DIAGNOSIS — I4819 Other persistent atrial fibrillation: Secondary | ICD-10-CM

## 2023-03-19 NOTE — Telephone Encounter (Signed)
Eliquis 5mg  refill request received. Patient is 52 years old, weight-123.4kg, Crea-0.96 on 10/21/22, Diagnosis-Afib, and last seen by Edd Fabian on 12/18/22. Dose is appropriate based on dosing criteria. Will send in refill to requested pharmacy.

## 2023-03-22 ENCOUNTER — Ambulatory Visit: Payer: BC Managed Care – PPO | Attending: General Practice | Admitting: General Practice

## 2023-03-22 ENCOUNTER — Encounter: Payer: Self-pay | Admitting: General Practice

## 2023-03-22 VITALS — BP 138/84 | HR 79 | Ht 76.0 in | Wt 270.8 lb

## 2023-03-22 DIAGNOSIS — I10 Essential (primary) hypertension: Secondary | ICD-10-CM

## 2023-03-22 DIAGNOSIS — E7800 Pure hypercholesterolemia, unspecified: Secondary | ICD-10-CM | POA: Diagnosis not present

## 2023-03-22 DIAGNOSIS — I4891 Unspecified atrial fibrillation: Secondary | ICD-10-CM

## 2023-03-22 DIAGNOSIS — R079 Chest pain, unspecified: Secondary | ICD-10-CM

## 2023-03-22 NOTE — Patient Instructions (Signed)
Medication Instructions:  The current medical regimen is effective;  continue present plan and medications as directed. Please refer to the Current Medication list given to you today.  *If you need a refill on your cardiac medications before your next appointment, please call your pharmacy*  Lab Work: LIPID PANEL TODAY If you have labs (blood work) drawn today and your tests are completely normal, you will receive your results only by:    MyChart Message (if you have MyChart) OR  A paper copy in the mail If you have any lab test that is abnormal or we need to change your treatment, we will call you to review the results.  Other Instructions CONTINUE DIET AND EXERCISE  Follow-Up: At [facility], you and your health needs are our priority.  As part of our continuing mission to provide you with exceptional heart care, we have created designated Provider Care Teams.  These Care Teams include your primary Cardiologist (physician) and Advanced Practice Providers (APPs -  Physician Assistants and Nurse Practitioners) who all work together to provide you with the care you need, when you need it.  Your next appointment:   6 month(s)  Provider:   Olga Millers, MD

## 2023-03-23 ENCOUNTER — Other Ambulatory Visit: Payer: Self-pay

## 2023-03-23 ENCOUNTER — Ambulatory Visit (HOSPITAL_BASED_OUTPATIENT_CLINIC_OR_DEPARTMENT_OTHER): Payer: BC Managed Care – PPO | Admitting: Pulmonary Disease

## 2023-03-23 DIAGNOSIS — Z789 Other specified health status: Secondary | ICD-10-CM

## 2023-03-23 LAB — LIPID PANEL
Chol/HDL Ratio: 3.7 {ratio} (ref 0.0–5.0)
Cholesterol, Total: 164 mg/dL (ref 100–199)
HDL: 44 mg/dL (ref >39)
LDL Chol Calc (NIH): 102 mg/dL — ABNORMAL HIGH (ref 0–99)
Triglycerides: 95 mg/dL (ref 0–149)
VLDL Cholesterol Cal: 18 mg/dL (ref 5–40)

## 2023-03-29 ENCOUNTER — Ambulatory Visit: Payer: BC Managed Care – PPO | Admitting: Family Medicine

## 2023-03-29 ENCOUNTER — Encounter: Payer: Self-pay | Admitting: Family Medicine

## 2023-03-29 VITALS — BP 133/79 | HR 71 | Resp 18 | Ht 76.0 in | Wt 272.0 lb

## 2023-03-29 DIAGNOSIS — Z1159 Encounter for screening for other viral diseases: Secondary | ICD-10-CM

## 2023-03-29 DIAGNOSIS — D582 Other hemoglobinopathies: Secondary | ICD-10-CM

## 2023-03-29 DIAGNOSIS — J4520 Mild intermittent asthma, uncomplicated: Secondary | ICD-10-CM

## 2023-03-29 DIAGNOSIS — I10 Essential (primary) hypertension: Secondary | ICD-10-CM | POA: Diagnosis not present

## 2023-03-29 MED ORDER — FLUTICASONE-SALMETEROL 100-50 MCG/ACT IN AEPB: 1.0000 | INHALATION_SPRAY | Freq: Two times a day (BID) | RESPIRATORY_TRACT | 1 refills | Status: DC

## 2023-03-29 NOTE — Assessment & Plan Note (Addendum)
Patient never received Advair daily inhaler, last update in chart was that prior authorization was filled out on cover my meds and denied.  Denial states that patient must try and fail preferred daily inhalers: Fluticasone-salmeterol, Wixela Inhub, Breo Ellipta.  Resending prescription for generic fluticasone-salmeterol.

## 2023-03-29 NOTE — Progress Notes (Signed)
Established Patient Office Visit  Subjective   Patient ID: Douglas Whitney, male    DOB: 02-02-1971  Age: 52 y.o. MRN: [id number]  Chief Complaint  Patient presents with   Hypertension    HPI SHERMAR WISMAN is a 52 y.o. male presenting today for follow up of hypertension.  He also notes that he never received the daily Advair inhaler that was discussed at his prior appointment. Hypertension: He is also followed by cardiology for hypertension and persistent atrial fibrillation.  For A-fib, he takes diltiazem 90 mg 3 times daily, Eliquis 5 mg twice daily.  Pt denies chest pain, SOB, dizziness, edema, syncope, fatigue or heart palpitations. Taking losartan 50 mg daily, diltiazem 300 mg daily, and hydrochlorothiazide which was increased to 50 mg daily earlier this year with previous PCP, reports excellent compliance with treatment. Denies side effects.   Outpatient Medications Prior to Visit  Medication Sig   albuterol (VENTOLIN HFA) 108 (90 Base) MCG/ACT inhaler INHALE 1 TO 2 PUFFS INTO THE LUNGS EVERY 6 HOURS AS NEEDED FOR WHEEZING OR SHORTNESS OF BREATH.   apixaban (ELIQUIS) 5 MG TABS tablet TAKE 1 TABLET BY MOUTH 2 TIMES DAILY.   azelastine (ASTELIN) 0.1 % nasal spray Place 2 sprays into both nostrils 2 (two) times daily. Use in each nostril as directed   diltiazem (CARDIZEM CD) 300 MG 24 hr capsule Take 1 capsule (300 mg total) by mouth daily.   hydrochlorothiazide (HYDRODIURIL) 25 MG tablet Take 1 tablet (25 mg total) by mouth 2 (two) times daily.   [DISCONTINUED] ADVAIR DISKUS 100-50 MCG/ACT AEPB Inhale 1 puff into the lungs 2 (two) times daily. (Patient not taking: Reported on 03/29/2023)   [DISCONTINUED] magnesium (MAGTAB) 84 MG ( ) TBCR SR tablet Take 84 mg by mouth. (Patient not taking: Reported on 03/29/2023)   [DISCONTINUED] Multiple Vitamin (MULTIVITAMIN WITH MINERALS) TABS tablet Take 1 tablet by mouth daily. (Patient not taking: Reported on 03/29/2023)   [DISCONTINUED]  Nutritional Supplements (JUICE PLUS FIBRE PO) Take 3 tablets by mouth daily. Vegetable Fruit Berry blend (Patient not taking: Reported on 03/29/2023)   No facility-administered medications prior to visit.    ROS Negative unless otherwise noted in HPI   Objective:     BP 133/79 (BP Location: Left Arm, Patient Position: Sitting, Cuff Size: Large)   Pulse 71   Resp 18   Ht 6\' 4"  (1.93 m)   Wt 272 lb (123.4 kg)   SpO2 98%   BMI 33.11 kg/m   Physical Exam Constitutional:      General: He is not in acute distress.    Appearance: Normal appearance.  HENT:     Head: Normocephalic and atraumatic.  Cardiovascular:     Rate and Rhythm: Normal rate and regular rhythm.     Heart sounds: Normal heart sounds. No murmur heard.    No friction rub. No gallop.  Pulmonary:     Effort: Pulmonary effort is normal. No respiratory distress.     Breath sounds: Normal breath sounds. No wheezing, rhonchi or rales.  Skin:    General: Skin is warm and dry.  Neurological:     Mental Status: He is alert and oriented to person, place, and time.  Psychiatric:        Mood and Affect: Mood normal.     Assessment & Plan:  Essential hypertension Assessment & Plan: BP goal <130/80.  Stable, essentially at goal.  Continue losartan 50 mg daily, hydrochlorothiazide 50 mg daily, diltiazem 300 mg daily.  Continue ambulatory blood pressure monitoring.  Follow-up with cardiology as scheduled.    Mild intermittent asthma without complication Assessment & Plan: Patient never received Advair daily inhaler, last update in chart was that prior authorization was filled out on cover my meds and denied.  Denial states that patient must try and fail preferred daily inhalers: Fluticasone-salmeterol, Wixela Inhub, Breo Ellipta.  Resending prescription for generic fluticasone-salmeterol.  Orders: -     Fluticasone-Salmeterol; Inhale 1 puff into the lungs 2 (two) times daily.  Dispense: 3 each; Refill: 1  Elevated  hemoglobin (HCC) -     CBC with Differential/Platelet; Future  Screening for viral disease -     Hepatitis C antibody; Future  Repeat CBC in a month for elevated hemoglobin.  Complete hepatitis C screening lab at that time as well.  Return in about 4 weeks (around 04/26/2023) for nonfasting blood work; 4 months HTN follow up appointment.    Melida Quitter, PA

## 2023-03-29 NOTE — Assessment & Plan Note (Addendum)
BP goal <130/80.  Stable, essentially at goal.  Continue losartan 50 mg daily, hydrochlorothiazide 50 mg daily, diltiazem 300 mg daily.  Continue ambulatory blood pressure monitoring.  Follow-up with cardiology as scheduled.

## 2023-03-31 ENCOUNTER — Telehealth: Payer: Self-pay | Admitting: *Deleted

## 2023-03-31 NOTE — Telephone Encounter (Signed)
According to [facility] preferred formulary listing, Fluticasone-Salmeterol (Advair) is a preferred but only under certain NDCs [employer] Drug Code). I reached out to pharmacy and patient to explain insurance requirement. Pharmacy staff member stated they will contact other pharmacies to see if they have in stock and then will reach out to patient.

## 2023-03-31 NOTE — Telephone Encounter (Signed)
Pt calling to say that he thought provider was going to be increasing his Breo Rx.  Contacted the pharmacy and they said that the Rx that was sent for advair still shows not covered.  She said pt told her that provider was going to increase breo from 100 to 200. Please advise.

## 2023-03-31 NOTE — Telephone Encounter (Signed)
It looks like we are still working on the prior authorization through cover my meds for the generic version of Advair which his insurance listed as being one of the preferred options.  Delice Bison, does cover my meds show any more recent updates?

## 2023-04-02 ENCOUNTER — Other Ambulatory Visit: Payer: Self-pay | Admitting: Nurse Practitioner

## 2023-04-02 ENCOUNTER — Other Ambulatory Visit: Payer: Self-pay | Admitting: Family Medicine

## 2023-04-02 DIAGNOSIS — R600 Localized edema: Secondary | ICD-10-CM

## 2023-04-02 DIAGNOSIS — I10 Essential (primary) hypertension: Secondary | ICD-10-CM

## 2023-04-05 MED ORDER — HYDROCHLOROTHIAZIDE 25 MG PO TABS: 25.0000 mg | ORAL_TABLET | Freq: Two times a day (BID) | ORAL | 3 refills | Status: DC

## 2023-04-16 ENCOUNTER — Ambulatory Visit: Payer: BC Managed Care – PPO

## 2023-04-27 ENCOUNTER — Other Ambulatory Visit: Payer: BC Managed Care – PPO

## 2023-04-27 DIAGNOSIS — D582 Other hemoglobinopathies: Secondary | ICD-10-CM

## 2023-04-27 DIAGNOSIS — Z1159 Encounter for screening for other viral diseases: Secondary | ICD-10-CM

## 2023-04-28 LAB — CBC WITH DIFFERENTIAL/PLATELET
Basophils Absolute: 0.1 10*3/uL (ref 0.0–0.2)
Basos: 1 %
EOS (ABSOLUTE): 0.1 10*3/uL (ref 0.0–0.4)
Eos: 1 %
Hematocrit: 44.1 % (ref 37.5–51.0)
Hemoglobin: 15.3 g/dL (ref 13.0–17.7)
Immature Grans (Abs): 0 10*3/uL (ref 0.0–0.1)
Immature Granulocytes: 0 %
Lymphocytes Absolute: 2.3 10*3/uL (ref 0.7–3.1)
Lymphs: 32 %
MCH: 33.3 pg — ABNORMAL HIGH (ref 26.6–33.0)
MCHC: 34.7 g/dL (ref 31.5–35.7)
MCV: 96 fL (ref 79–97)
Monocytes Absolute: 0.5 10*3/uL (ref 0.1–0.9)
Monocytes: 7 %
Neutrophils Absolute: 4.2 10*3/uL (ref 1.4–7.0)
Neutrophils: 59 %
Platelets: 333 10*3/uL (ref 150–450)
RBC: 4.6 x10E6/uL (ref 4.14–5.80)
RDW: 12.6 % (ref 11.6–15.4)
WBC: 7.2 10*3/uL (ref 3.4–10.8)

## 2023-04-28 LAB — HEPATITIS C ANTIBODY: Hep C Virus Ab: NONREACTIVE

## 2023-04-29 ENCOUNTER — Ambulatory Visit: Payer: BC Managed Care – PPO | Admitting: Cardiology

## 2023-05-05 ENCOUNTER — Other Ambulatory Visit: Payer: Self-pay | Admitting: Nurse Practitioner

## 2023-05-05 DIAGNOSIS — J4520 Mild intermittent asthma, uncomplicated: Secondary | ICD-10-CM

## 2023-05-10 ENCOUNTER — Ambulatory Visit (HOSPITAL_BASED_OUTPATIENT_CLINIC_OR_DEPARTMENT_OTHER): Payer: BC Managed Care – PPO | Admitting: Pulmonary Disease

## 2023-05-10 ENCOUNTER — Encounter (HOSPITAL_BASED_OUTPATIENT_CLINIC_OR_DEPARTMENT_OTHER): Payer: Self-pay | Admitting: Pulmonary Disease

## 2023-05-10 ENCOUNTER — Ambulatory Visit (HOSPITAL_BASED_OUTPATIENT_CLINIC_OR_DEPARTMENT_OTHER): Payer: BC Managed Care – PPO

## 2023-05-10 VITALS — BP 144/82 | HR 80 | Ht 76.0 in | Wt 283.4 lb

## 2023-05-10 DIAGNOSIS — J4531 Mild persistent asthma with (acute) exacerbation: Secondary | ICD-10-CM | POA: Diagnosis not present

## 2023-05-10 DIAGNOSIS — R079 Chest pain, unspecified: Secondary | ICD-10-CM

## 2023-05-10 MED ORDER — PREDNISONE 10 MG PO TABS: ORAL_TABLET | ORAL | 0 refills | Status: AC

## 2023-05-10 MED ORDER — AMOXICILLIN-POT CLAVULANATE 875-125 MG PO TABS: 1.0000 | ORAL_TABLET | Freq: Two times a day (BID) | ORAL | 0 refills | Status: AC

## 2023-05-10 MED ORDER — FLUTICASONE-SALMETEROL 250-50 MCG/ACT IN AEPB: 1.0000 | INHALATION_SPRAY | Freq: Two times a day (BID) | RESPIRATORY_TRACT | 0 refills | Status: DC

## 2023-05-10 NOTE — Patient Instructions (Addendum)
Asthma exacerbation Prednisone taper INCREASE Wixela to 250-50 mcg for one month. Inhale ONE puff in the morning and evening. Rinse mouth out after use CONTINUE Albuterol AS NEEDED for shortness of breath or wheezing  Hx lung abscess --CT Chest 02/09/20: Resolved RUL consolidation --CXR ordered in-clinic today. No significant infiltrate --Prescribe 10 day course of Augmentin
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
## 2023-06-05 ENCOUNTER — Other Ambulatory Visit (HOSPITAL_BASED_OUTPATIENT_CLINIC_OR_DEPARTMENT_OTHER): Payer: Self-pay | Admitting: Pulmonary Disease

## 2023-06-05 MED ORDER — FLUTICASONE-SALMETEROL 250-50 MCG/ACT IN AEPB: 1.0000 | INHALATION_SPRAY | Freq: Two times a day (BID) | RESPIRATORY_TRACT | 3 refills | Status: DC

## 2023-06-05 MED ORDER — WIXELA INHUB 250-50 MCG/ACT IN AEPB: 1.0000 | INHALATION_SPRAY | Freq: Two times a day (BID) | RESPIRATORY_TRACT | 1 refills | Status: DC

## 2023-06-28 ENCOUNTER — Ambulatory Visit (HOSPITAL_BASED_OUTPATIENT_CLINIC_OR_DEPARTMENT_OTHER): Payer: 59 | Admitting: Pulmonary Disease

## 2023-06-28 ENCOUNTER — Encounter (HOSPITAL_BASED_OUTPATIENT_CLINIC_OR_DEPARTMENT_OTHER): Payer: Self-pay | Admitting: Pulmonary Disease

## 2023-06-28 ENCOUNTER — Other Ambulatory Visit (HOSPITAL_BASED_OUTPATIENT_CLINIC_OR_DEPARTMENT_OTHER): Payer: Self-pay

## 2023-06-28 VITALS — BP 158/80 | HR 83 | Ht 76.0 in | Wt 287.0 lb

## 2023-06-28 DIAGNOSIS — J4530 Mild persistent asthma, uncomplicated: Secondary | ICD-10-CM | POA: Diagnosis not present

## 2023-06-28 MED ORDER — FLUTICASONE-SALMETEROL 250-50 MCG/ACT IN AEPB
1.0000 | INHALATION_SPRAY | Freq: Two times a day (BID) | RESPIRATORY_TRACT | 3 refills | Status: DC
  Filled 2023-06-28: qty 60, 30d supply, fill #0

## 2023-06-28 MED ORDER — PREDNISONE 10 MG PO TABS: ORAL_TABLET | ORAL | 0 refills | Status: DC

## 2023-06-28 MED ORDER — PREDNISONE 10 MG PO TABS
ORAL_TABLET | ORAL | 0 refills | Status: AC
  Filled 2023-06-28 (×2): qty 20, 8d supply, fill #0
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
## 2023-06-28 NOTE — Patient Instructions (Signed)
Asthma --CONTINUE Wixela 250-50 mcg for one month. Inhale ONE puff in the morning and evening. Rinse mouth out after use --CONTINUE Albuterol AS NEEDED for shortness of breath or wheezing Prednisone pack provided as needed  Hx lung abscess --CT Chest 02/09/20: Resolved RUL consolidation --CXR 05/10/23 No significant infiltrate

## 2023-07-15 ENCOUNTER — Encounter: Payer: Self-pay | Admitting: Family Medicine

## 2023-07-30 ENCOUNTER — Ambulatory Visit: Payer: BC Managed Care – PPO | Admitting: Family Medicine

## 2023-08-25 ENCOUNTER — Encounter (HOSPITAL_BASED_OUTPATIENT_CLINIC_OR_DEPARTMENT_OTHER): Payer: Self-pay | Admitting: Pulmonary Disease

## 2023-08-25 ENCOUNTER — Other Ambulatory Visit: Payer: Self-pay | Admitting: Nurse Practitioner

## 2023-08-25 DIAGNOSIS — J301 Allergic rhinitis due to pollen: Secondary | ICD-10-CM

## 2023-08-26 ENCOUNTER — Other Ambulatory Visit: Payer: Self-pay | Admitting: Family Medicine

## 2023-08-26 DIAGNOSIS — J301 Allergic rhinitis due to pollen: Secondary | ICD-10-CM

## 2023-08-26 MED ORDER — FLUTICASONE-SALMETEROL 250-50 MCG/ACT IN AEPB: 1.0000 | INHALATION_SPRAY | Freq: Two times a day (BID) | RESPIRATORY_TRACT | 3 refills | Status: AC

## 2023-08-30 ENCOUNTER — Telehealth: Payer: Self-pay | Admitting: Cardiology

## 2023-08-30 NOTE — Telephone Encounter (Signed)
 Paper Work Dropped Off:  [facility] Diver Medication Form  Date: 08/30/23  Location of paper:  Provider Mailbox

## 2023-09-01 NOTE — Telephone Encounter (Signed)
 Paperwork given to [name] np as he has seen the patient the last 2 times in the office.

## 2023-09-02 NOTE — Telephone Encounter (Signed)
 Faxed yesterday

## 2023-09-11 ENCOUNTER — Other Ambulatory Visit: Payer: Self-pay | Admitting: Family Medicine

## 2023-09-11 DIAGNOSIS — I10 Essential (primary) hypertension: Secondary | ICD-10-CM

## 2023-09-11 DIAGNOSIS — R600 Localized edema: Secondary | ICD-10-CM

## 2023-10-15 ENCOUNTER — Other Ambulatory Visit: Payer: Self-pay | Admitting: Cardiology

## 2023-10-15 DIAGNOSIS — I4819 Other persistent atrial fibrillation: Secondary | ICD-10-CM

## 2023-10-15 NOTE — Telephone Encounter (Signed)
 Prescription refill request for Eliquis  received. Indication:afib Last office visit:10/24 Scr:0.96  5/24 Age: 53 Weight:130.2  kg  Prescription refilled

## 2023-12-16 ENCOUNTER — Other Ambulatory Visit: Payer: Self-pay | Admitting: General Practice

## 2023-12-16 DIAGNOSIS — I4891 Unspecified atrial fibrillation: Secondary | ICD-10-CM

## 2024-03-02 ENCOUNTER — Telehealth (HOSPITAL_BASED_OUTPATIENT_CLINIC_OR_DEPARTMENT_OTHER): Payer: Self-pay | Admitting: *Deleted

## 2024-03-02 NOTE — Telephone Encounter (Signed)
   Pre-operative Risk Assessment    Patient Name: Douglas Whitney  DOB: 04-Aug-1970 MRN: [id number]   Date of last office visit: 03/22/23 [name], FNP Date of next office visit: NONE   Request for Surgical Clearance    Procedure:  COLONOSCOPY AND EGD  Date of Surgery:  Clearance TBD                                Surgeon:  DR. LADORA Surgeon's Group or Practice Name:  [facility] Phone number:  [phone] Fax number:  [phone]   Type of Clearance Requested:   - Medical  - Pharmacy:  Hold Apixaban  (Eliquis ) x 2 DAYS PRIOR   Type of Anesthesia:  Not Indicated (PROPOFOL ?)   Additional requests/questions:    Douglas Whitney   03/02/2024, 9:12 AM

## 2024-03-11 ENCOUNTER — Other Ambulatory Visit: Payer: Self-pay | Admitting: Family Medicine

## 2024-03-11 DIAGNOSIS — J4520 Mild intermittent asthma, uncomplicated: Secondary | ICD-10-CM

## 2024-03-11 DIAGNOSIS — R600 Localized edema: Secondary | ICD-10-CM

## 2024-03-11 DIAGNOSIS — I10 Essential (primary) hypertension: Secondary | ICD-10-CM

## 2024-03-13 ENCOUNTER — Telehealth (HOSPITAL_BASED_OUTPATIENT_CLINIC_OR_DEPARTMENT_OTHER): Payer: Self-pay | Admitting: *Deleted

## 2024-03-13 NOTE — Telephone Encounter (Signed)
 Patient with diagnosis of afib on Eliquis  for anticoagulation.    Procedure: COLONOSCOPY AND EGD  Date of procedure: TBD   CHA2DS2-VASc Score = 1   This indicates a 0.6% annual risk of stroke. The patient's score is based upon: CHF History: 0 HTN History: 1 Diabetes History: 0 Stroke History: 0 Vascular Disease History: 0 Age Score: 0 Gender Score: 0       CrCl 172 ml/min Platelet count 401  Patient has not had an Afib/aflutter ablation or Watchman within the last 3 months or DCCV within the last 30 days   Per office protocol, patient can hold Eliquis  for 2 days prior to procedure.    **This guidance is not considered finalized until pre-operative APP has relayed final recommendations.**

## 2024-03-13 NOTE — Telephone Encounter (Signed)
   Name: Douglas Whitney  DOB: August 13, 1970  MRN: [id number]  Primary Cardiologist: Redell Shallow, MD   Preoperative team, please contact this patient and set up a phone call appointment for further preoperative risk assessment. Please obtain consent and complete medication review. Thank you for your help.  I confirm that guidance regarding antiplatelet and oral anticoagulation therapy has been completed and, if necessary, noted below.  Per pharm D, patient  has not had an Afib/aflutter ablation or Watchman within the last 3 months or DCCV within the last 30 days    Per office protocol, patient can hold Eliquis  for 2 days prior to procedure.  I also confirmed the patient resides in the state of [place] . As per [facility] Medical Board telemedicine laws, the patient must reside in the state in which the provider is licensed.    Barnie Hila, NP 03/13/2024, 11:38 AM [facility]

## 2024-03-13 NOTE — Telephone Encounter (Signed)
 Pt has been scheduled tele preop appt 03/15/24. Pt states surgeon will not schedule surgery until he has been cleared.   Med rec and consent are done.

## 2024-03-13 NOTE — Telephone Encounter (Signed)
 Pt has been scheduled tele preop appt 03/15/24. Pt states surgeon will not schedule surgery until he has been cleared.   Med rec and consent are done.      Patient Consent for Virtual Visit        Douglas Whitney has provided verbal consent on 03/13/2024 for a virtual visit (video or telephone).   CONSENT FOR VIRTUAL VISIT FOR:  Douglas Whitney  By participating in this virtual visit I agree to the following:  I hereby voluntarily request, consent and authorize [facility] and its employed or contracted physicians, physician assistants, nurse practitioners or other licensed health care professionals (the Practitioner), to provide me with telemedicine health care services (the "Services) as deemed necessary by the treating Practitioner. I acknowledge and consent to receive the Services by the Practitioner via telemedicine. I understand that the telemedicine visit will involve communicating with the Practitioner through live audiovisual communication technology and the disclosure of certain medical information by electronic transmission. I acknowledge that I have been given the opportunity to request an in-person assessment or other available alternative prior to the telemedicine visit and am voluntarily participating in the telemedicine visit.  I understand that I have the right to withhold or withdraw my consent to the use of telemedicine in the course of my care at any time, without affecting my right to future care or treatment, and that the Practitioner or I may terminate the telemedicine visit at any time. I understand that I have the right to inspect all information obtained and/or recorded in the course of the telemedicine visit and may receive copies of available information for a reasonable fee.  I understand that some of the potential risks of receiving the Services via telemedicine include:  Delay or interruption in medical evaluation due to technological equipment failure or  disruption; Information transmitted may not be sufficient (e.g. poor resolution of images) to allow for appropriate medical decision making by the Practitioner; and/or  In rare instances, security protocols could fail, causing a breach of personal health information.  Furthermore, I acknowledge that it is my responsibility to provide information about my medical history, conditions and care that is complete and accurate to the best of my ability. I acknowledge that Practitioner's advice, recommendations, and/or decision may be based on factors not within their control, such as incomplete or inaccurate data provided by me or distortions of diagnostic images or specimens that may result from electronic transmissions. I understand that the practice of medicine is not an exact science and that Practitioner makes no warranties or guarantees regarding treatment outcomes. I acknowledge that a copy of this consent can be made available to me via my patient portal [facility] MyChart), or I can request a printed copy by calling the office of [facility].    I understand that my insurance will be billed for this visit.   I have read or had this consent read to me. I understand the contents of this consent, which adequately explains the benefits and risks of the Services being provided via telemedicine.  I have been provided ample opportunity to ask questions regarding this consent and the Services and have had my questions answered to my satisfaction. I give my informed consent for the services to be provided through the use of telemedicine in my medical care

## 2024-03-15 ENCOUNTER — Ambulatory Visit: Attending: Cardiology

## 2024-03-15 DIAGNOSIS — Z01810 Encounter for preprocedural cardiovascular examination: Secondary | ICD-10-CM | POA: Diagnosis not present

## 2024-03-15 NOTE — Progress Notes (Signed)
 Virtual Visit via Telephone Note   Because of [name] co-morbid illnesses, he is at least at moderate risk for complications without adequate follow up.  This format is felt to be most appropriate for this patient at this time.  Due to technical limitations with video connection Web designer), today's appointment will be conducted as an audio only telehealth visit, and [name] verbally agreed to proceed in this manner.   All issues noted in this document were discussed and addressed.  No physical exam could be performed with this format.  Evaluation Performed:  Preoperative cardiovascular risk assessment _____________   Date:  03/15/2024   Patient ID:  Douglas Whitney, DOB 1971/05/26, MRN [id number] Patient Location:  Home Provider location:   Office  Primary Care Provider:  Wallace Joesph LABOR, PA Primary Cardiologist:  Redell Shallow, MD  Chief Complaint / Patient Profile   53 y.o. y/o male with a h/o atrial fibrillation, hypertension, hyperlipidemia who is pending colonoscopy, EGD and presents today for telephonic preoperative cardiovascular risk assessment.  History of Present Illness    Douglas Whitney is a 53 y.o. male who presents via audio/video conferencing for a telehealth visit today.  Pt was last seen in cardiology clinic on 03/22/2023 by [name], NP-C.  At that time Douglas Whitney was doing well .  The patient is now pending procedure as outlined above. Since his last visit, he continues to be stable from a cardiac standpoint.  Today he denies chest pain, shortness of breath, lower extremity edema, fatigue, melena, hematuria, hemoptysis, diaphoresis, weakness, presyncope, syncope, orthopnea, and PND.   Past Medical History    Past Medical History:  Diagnosis Date   Acid reflux    Hypertension    Seasonal allergies    Sinusitis    Snores    Wears glasses    Past Surgical History:  Procedure Laterality Date   CARDIOVERSION N/A 10/07/2022    Procedure: CARDIOVERSION;  Surgeon: Francyne Headland, MD;  Location: [facility];  Service: Cardiovascular;  Laterality: N/A;   CHOLECYSTECTOMY N/A 08/01/2015   Procedure: LAPAROSCOPIC CHOLECYSTECTOMY WITH INTRAOPERATIVE CHOLANGIOGRAM;  Surgeon: Krystal Spinner, MD;  Location: [facility] OR;  Service: General;  Laterality: N/A;   ESOPHAGOGASTRODUODENOSCOPY  2017   FINGER AMPUTATION Left 1983   cut off tip index finger   FRACTURE SURGERY     I & D EXTREMITY Left 05/06/2018   Procedure: IRRIGATION AND DEBRIDEMENT SMALL FINGER;  Surgeon: Josefina Chew, MD;  Location: [facility] OR;  Service: Orthopedics;  Laterality: Left;   JOINT REPLACEMENT     NASAL SEPTOPLASTY W/ TURBINOPLASTY Bilateral 04/14/2013   Procedure: NASAL SEPTOPLASTY BILATERAL INFERIOR TURBINATE REDUCTION ;  Surgeon: Alm Bouche, MD;  Location: [facility];  Service: ENT;  Laterality: Bilateral;   ORIF FINGER FRACTURE Left 1984   middle   TOTAL HIP ARTHROPLASTY Right 03/2016   WISDOM TOOTH EXTRACTION      Allergies  Allergies  Allergen Reactions   Crestor  [Rosuvastatin ] Other (See Comments)    Joint aches     Home Medications    Prior to Admission medications   Medication Sig Start Date End Date Taking? Authorizing Provider  albuterol  (VENTOLIN  HFA) 108 (90 Base) MCG/ACT inhaler INHALE 1 TO 2 PUFFS INTO THE LUNGS EVERY 6 HOURS AS NEEDED FOR WHEEZING OR SHORTNESS OF BREATH. 05/05/23   Wallace Joesph A, PA  Ascorbic Acid  (VITAMIN C ) 1000 MG tablet Take 1,000 mg by mouth daily.    [provider]  ASHWAGANDHA PO Take by mouth.    [provider]  azelastine  (ASTELIN ) 0.1 % nasal spray PLACE 2 SPRAYS INTO BOTH NOSTRILS 2 TIMES DAILY AS DIRECTED 08/26/23   Chandra Toribio POUR, MD  cholecalciferol (VITAMIN D3) 25 MCG (1000 UNIT) tablet Take 1,000 Units by mouth daily.    [provider]  diltiazem  (CARDIZEM  CD) 300 MG 24 hr capsule TAKE 1 CAPSULE (300 MG TOTAL) BY MOUTH DAILY. 12/17/23   Suresh Audi,  Josefa HERO, NP  ELIQUIS  5 MG TABS tablet TAKE 1 TABLET BY MOUTH 2 TIMES DAILY. 10/15/23   Pietro Redell RAMAN, MD  fluticasone -salmeterol (WIXELA INHUB ) 250-50 MCG/ACT AEPB Inhale 1 puff into the lungs in the morning and at bedtime. 08/26/23   Kassie Acquanetta Bradley, MD  hydrochlorothiazide  (HYDRODIURIL ) 25 MG tablet TAKE 1 TABLET BY MOUTH 2 TIMES DAILY. 09/13/23   Chandra Toribio POUR, MD  magnesium  oxide (MAG-OX) 400 (240 Mg) MG tablet Take 400 mg by mouth daily.    [provider]  Omega-3 Fatty Acids (OMEGA-3 FISH OIL PO) Take by mouth.    [provider]    Physical Exam    Vital Signs:  GIDEON BURSTEIN does not have vital signs available for review today.  Given telephonic nature of communication, physical exam is limited. AAOx3. NAD. Normal affect.  Speech and respirations are unlabored.  Accessory Clinical Findings    None  Assessment & Plan    1.  Preoperative Cardiovascular Risk Assessment:COLONOSCOPY AND EGD   Date of Surgery:  Clearance TBD                                  Surgeon:  DR. LADORA Surgeon's Group or Practice Name:  [facility] Phone number:  [phone] Fax number:  [phone]      Primary Cardiologist: Redell Pietro, MD  Chart reviewed as part of pre-operative protocol coverage. Given past medical history and time since last visit, based on ACC/AHA guidelines, Douglas Whitney would be at acceptable risk for the planned procedure without further cardiovascular testing.    Patient was advised that if he develops new symptoms prior to surgery to contact our office to arrange a follow-up appointment.  He verbalized understanding.  Per office protocol, patient can hold Eliquis  for 2 days prior to procedure.   I will route this recommendation to the requesting party via Epic fax function and remove from pre-op pool.       Time:   Today, I have spent 6 minutes with the patient with telehealth technology discussing medical history, symptoms, and  management plan.  I spent 10 minutes reviewing patient's past cardiac history and cardiac medications.    Josefa HERO Beauvais, NP  03/15/2024, 8:18 AM

## 2024-04-03 ENCOUNTER — Other Ambulatory Visit: Payer: Self-pay | Admitting: General Practice

## 2024-04-03 DIAGNOSIS — I4891 Unspecified atrial fibrillation: Secondary | ICD-10-CM

## 2024-04-10 ENCOUNTER — Encounter: Payer: Self-pay | Admitting: *Deleted

## 2024-04-12 ENCOUNTER — Ambulatory Visit: Attending: Physician Assistant | Admitting: Physician Assistant

## 2024-04-12 ENCOUNTER — Encounter: Payer: Self-pay | Admitting: Physician Assistant

## 2024-04-12 VITALS — BP 136/84 | HR 84 | Ht 76.0 in

## 2024-04-12 DIAGNOSIS — E7800 Pure hypercholesterolemia, unspecified: Secondary | ICD-10-CM | POA: Insufficient documentation

## 2024-04-12 DIAGNOSIS — I2510 Atherosclerotic heart disease of native coronary artery without angina pectoris: Secondary | ICD-10-CM | POA: Diagnosis not present

## 2024-04-12 DIAGNOSIS — I10 Essential (primary) hypertension: Secondary | ICD-10-CM

## 2024-04-12 DIAGNOSIS — I4819 Other persistent atrial fibrillation: Secondary | ICD-10-CM | POA: Diagnosis not present

## 2024-04-12 DIAGNOSIS — I77810 Thoracic aortic ectasia: Secondary | ICD-10-CM

## 2024-04-12 MED ORDER — APIXABAN 5 MG PO TABS: 5.0000 mg | ORAL_TABLET | Freq: Two times a day (BID) | ORAL | 3 refills | Status: AC

## 2024-04-12 MED ORDER — DILTIAZEM HCL ER COATED BEADS 300 MG PO CP24: 300.0000 mg | ORAL_CAPSULE | Freq: Every day | ORAL | 3 refills | Status: AC

## 2024-04-12 NOTE — Assessment & Plan Note (Signed)
 Borderline control.  Continue to monitor. - Continue Cardizem  CD 300 mg daily - Continue hydrochlorothiazide  25 mg twice daily

## 2024-04-12 NOTE — Assessment & Plan Note (Signed)
 S/p cardioversion in May 2024. Currently maintaining sinus rhythm with no prolonged arrhythmia episodes. Tolerating anticoagulation with Eliquis . - Continue diltiazem  300 mg daily - Continue Eliquis  5 mg twice daily - Follow up in one year

## 2024-04-12 NOTE — Patient Instructions (Signed)
 Medication Instructions:  No changes.   *If you need a refill on your cardiac medications before your next appointment, please call your pharmacy*  Tests Echocardiogram Your physician has requested that you have an echocardiogram. Echocardiography is a painless test that uses sound waves to create images of your heart. It provides your doctor with information about the size and shape of your heart and how well your heart's chambers and valves are working. This procedure takes approximately one hour. There are no restrictions for this procedure. Please do NOT wear cologne, perfume, aftershave, or lotions (deodorant is allowed). Please arrive 15 minutes prior to your appointment time.  Please note: We ask at that you not bring children with you during ultrasound (echo/ vascular) testing. Due to room size and safety concerns, children are not allowed in the ultrasound rooms during exams. Our front office staff cannot provide observation of children in our lobby area while testing is being conducted. An adult accompanying a patient to their appointment will only be allowed in the ultrasound room at the discretion of the ultrasound technician under special circumstances. We apologize for any inconvenience.   Follow-Up: At [facility], you and your health needs are our priority.  As part of our continuing mission to provide you with exceptional heart care, our providers are all part of one team.  This team includes your primary Cardiologist (physician) and Advanced Practice Providers or APPs (Physician Assistants and Nurse Practitioners) who all work together to provide you with the care you need, when you need it.  Your next appointment:   12 month(s)  Provider:   Redell Shallow, MD or [name], NP          We recommend signing up for the patient portal called MyChart.  Sign up information is provided on this After Visit Summary.  MyChart is used to connect with patients for Virtual  Visits (Telemedicine).  Patients are able to view lab/test results, encounter notes, upcoming appointments, etc.  Non-urgent messages can be sent to your provider as well.   To learn more about what you can do with MyChart, go to [email or web address].   Other Instructions If you decide to take cholesterol medication, let us  know.

## 2024-04-12 NOTE — Progress Notes (Signed)
 OFFICE NOTE:    Date:  04/12/2024  ID:  Douglas Whitney, DOB 11-11-1970, MRN [id number] PCP: Wallace Joesph LABOR, PA  [facility] Providers Cardiologist:  Redell Shallow, MD        Persistent atrial fibrillation  Monitor 01/2021: NSR, Rare PVCs, PACs S/p DCCV 10/2022 TTE 10/21/22: EF 60-65, no RWMA, Gr 1 DD, NL RVSF, NL PaSP, RVSP 30.5, mild LaE, AV sclerosis, ascending aorta 40 mm Coronary artery Ca2+ Hypertension Hyperlipidemia   Myalgias w Rosuvastatin   Asthma        Discussed the use of AI scribe software for clinical note transcription with the patient, who gave verbal consent to proceed. History of Present Illness Douglas Whitney is a 52 y.o. male for follow up of AFib, CAD. Est with Dr. Shallow in 10/2022. Last seen 03/2023 w [name], NP.   He is here alone.  He notes occasional brief episodes of palpitations, described as feeling like 'anxiety,' which resolve with a couple of deep breaths. No chest discomfort, pain, pressure, tightness, or shortness of breath is reported.  He reports that his blood pressure is sometimes better at home, with the top number generally less than 130, occasionally in the 120s.  He notes a history of snoring.  He underwent a sleep study which ruled out sleep apnea.  He has not had syncope.    Review of Systems  Gastrointestinal:  Negative for hematochezia and melena.  Genitourinary:  Negative for hematuria.  -See HPI    Studies Reviewed:  EKG Interpretation Date/Time:  Wednesday April 12 2024 14:09:26 EST Ventricular Rate:  81 PR Interval:  198 QRS Duration:  96 QT Interval:  370 QTC Calculation: 429 R Axis:   37  Text Interpretation: Normal sinus rhythm Minimal voltage criteria for LVH, may be normal variant Possible Inferior infarct , age undetermined No significant change since last tracing Confirmed by Lelon Hamilton [phone]) on 04/12/2024 2:33:57 PM    Labs from primary care (Care Everywhere) 11/29/2023: Total  cholesterol 170, triglycerides 147, HDL 39, LDL 105, K 3.7, creatinine 0.89, ALT 49, hemoglobin 14.4, platelet count 401,000  Risk Assessment/Calculations: CHA2DS2-VASc Score = 2   This indicates a 2.2% annual risk of stroke. The patient's score is based upon: CHF History: 0 HTN History: 1 Diabetes History: 0 Stroke History: 0 Vascular Disease History: 1 Age Score: 0 Gender Score: 0           Physical Exam:  VS:  BP 136/84   Pulse 84   Ht 6' 4 (1.93 m)   SpO2 96%   BMI 34.93 kg/m        Wt Readings from Last 3 Encounters:  06/28/23 287 lb (130.2 kg)  05/10/23 283 lb 6.4 oz (128.5 kg)  03/29/23 272 lb (123.4 kg)    Constitutional:      Appearance: Healthy appearance. Not in distress.  Neck:     Vascular: JVD normal.  Pulmonary:     Breath sounds: Normal breath sounds. No wheezing. No rales.  Cardiovascular:     Normal rate. Regular rhythm.     Murmurs: There is no murmur.  Edema:    Peripheral edema absent.  Abdominal:     Palpations: Abdomen is soft.       Assessment and Plan:    Assessment & Plan Persistent atrial fibrillation [facility]) S/p cardioversion in May 2024. Currently maintaining sinus rhythm with no prolonged arrhythmia episodes. Tolerating anticoagulation with Eliquis . - Continue diltiazem  300 mg daily -  Continue Eliquis  5 mg twice daily - Follow up in one year Coronary artery calcification No angina symptoms. Current LDL is 105 mg/dL, with a target of less than 70 mg/dL due to coronary calcification. Intolerant to high-dose rosuvastatin  due to myalgias.  We discussed the benefits of statin therapy for plaque stabilization and reduce CV risk. He is considering low-dose statin therapy and will inform if he decides to proceed. Essential hypertension Borderline control.  Continue to monitor. - Continue Cardizem  CD 300 mg daily - Continue hydrochlorothiazide  25 mg twice daily Pure hypercholesterolemia As noted, LDL above goal.  He is currently taking  fish oil.  We discussed the benefits of statin therapy.  He will consider low-dose statin therapy and let us  know if he would like to start this.  Goal LDL <70. 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 Pietro or [name], NP.  Signed, Glendia Ferrier, PA-C

## 2024-04-12 NOTE — Assessment & Plan Note (Signed)
 As noted, LDL above goal.  He is currently taking fish oil.  We discussed the benefits of statin therapy.  He will consider low-dose statin therapy and let us  know if he would like to start this.  Goal LDL <70.

## 2024-04-12 NOTE — Assessment & Plan Note (Signed)
 No angina symptoms. Current LDL is 105 mg/dL, with a target of less than 70 mg/dL due to coronary calcification. Intolerant to high-dose rosuvastatin  due to myalgias.  We discussed the benefits of statin therapy for plaque stabilization and reduce CV risk. He is considering low-dose statin therapy and will inform if he decides to proceed.

## 2024-05-17 ENCOUNTER — Ambulatory Visit (HOSPITAL_COMMUNITY)
Admission: RE | Admit: 2024-05-17 | Discharge: 2024-05-17 | Disposition: A | Discharge status: Home / Self Care | Source: Ambulatory Visit | Attending: Cardiovascular Disease | Admitting: Cardiovascular Disease

## 2024-05-17 ENCOUNTER — Ambulatory Visit: Payer: Self-pay | Admitting: Physician Assistant

## 2024-05-17 DIAGNOSIS — I4891 Unspecified atrial fibrillation: Secondary | ICD-10-CM | POA: Diagnosis not present

## 2024-05-17 DIAGNOSIS — I77810 Thoracic aortic ectasia: Secondary | ICD-10-CM | POA: Diagnosis present

## 2024-05-17 DIAGNOSIS — I10 Essential (primary) hypertension: Secondary | ICD-10-CM | POA: Diagnosis not present

## 2024-05-17 DIAGNOSIS — I340 Nonrheumatic mitral (valve) insufficiency: Secondary | ICD-10-CM | POA: Insufficient documentation

## 2024-05-17 DIAGNOSIS — I361 Nonrheumatic tricuspid (valve) insufficiency: Secondary | ICD-10-CM | POA: Insufficient documentation

## 2024-05-17 LAB — ECHOCARDIOGRAM COMPLETE
Area-P 1/2: 3.61 cm2
S' Lateral: 3.4 cm

## 2024-05-24 NOTE — Progress Notes (Signed)
 Spoke with patient regarding echo results. No questions or concerns at this time. Patient gave updated email for mychart pw reset. Sent message to Emerick Hopping to assist. Pt also wants mentioned that he is to only be scheduled with [name] and Redell Shallow.

## 2024-05-24 NOTE — Progress Notes (Signed)
 Email has been updated by Emerick Hopping for patient to reset pw for mychart. Also, per [facility] she is making a FYI permanent comment in pt chart in order for pt to be seen by preferred provider. He is to also specify that when making appts. Pt has been made aware.

## 2024-06-12 ENCOUNTER — Encounter (HOSPITAL_BASED_OUTPATIENT_CLINIC_OR_DEPARTMENT_OTHER): Payer: Self-pay | Admitting: Pulmonary Disease

## 2024-06-12 ENCOUNTER — Ambulatory Visit (HOSPITAL_BASED_OUTPATIENT_CLINIC_OR_DEPARTMENT_OTHER): Admitting: Pulmonary Disease

## 2024-06-12 ENCOUNTER — Other Ambulatory Visit (HOSPITAL_BASED_OUTPATIENT_CLINIC_OR_DEPARTMENT_OTHER): Payer: Self-pay

## 2024-06-12 VITALS — BP 137/82 | HR 63 | Ht 76.0 in | Wt 270.0 lb

## 2024-06-12 DIAGNOSIS — R0683 Snoring: Secondary | ICD-10-CM

## 2024-06-12 DIAGNOSIS — Z8709 Personal history of other diseases of the respiratory system: Secondary | ICD-10-CM

## 2024-06-12 DIAGNOSIS — J45909 Unspecified asthma, uncomplicated: Secondary | ICD-10-CM

## 2024-06-12 DIAGNOSIS — Z87891 Personal history of nicotine dependence: Secondary | ICD-10-CM

## 2024-06-12 DIAGNOSIS — I10 Essential (primary) hypertension: Secondary | ICD-10-CM | POA: Diagnosis not present

## 2024-06-12 DIAGNOSIS — J4530 Mild persistent asthma, uncomplicated: Secondary | ICD-10-CM

## 2024-06-12 MED ORDER — AIRSUPRA 90-80 MCG/ACT IN AERO: 2.0000 | INHALATION_SPRAY | Freq: Two times a day (BID) | RESPIRATORY_TRACT | 5 refills | Status: AC
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
## 2024-06-12 NOTE — Patient Instructions (Signed)
 Asthma --START Airsupra  TWO puffs in the morning and evening. OK to use as rescue AS NEEDED. Rinse mouth out after use

## 2024-07-12 ENCOUNTER — Other Ambulatory Visit: Payer: Self-pay | Admitting: Family Medicine

## 2024-07-12 DIAGNOSIS — J4520 Mild intermittent asthma, uncomplicated: Secondary | ICD-10-CM

## 2024-09-25 ENCOUNTER — Ambulatory Visit (HOSPITAL_BASED_OUTPATIENT_CLINIC_OR_DEPARTMENT_OTHER): Admitting: Pulmonary Disease
